# Patient Record
Sex: Male | Born: 1970
Health system: Southern US, Community
[De-identification: ages and names within clinical notes are randomized; demographics above are authoritative.]

## PROBLEM LIST (undated history)

## (undated) DIAGNOSIS — E785 Hyperlipidemia, unspecified: Secondary | ICD-10-CM

## (undated) DIAGNOSIS — I519 Heart disease, unspecified: Secondary | ICD-10-CM

## (undated) DIAGNOSIS — N200 Calculus of kidney: Secondary | ICD-10-CM

## (undated) DIAGNOSIS — E119 Type 2 diabetes mellitus without complications: Secondary | ICD-10-CM

## (undated) DIAGNOSIS — K760 Fatty (change of) liver, not elsewhere classified: Secondary | ICD-10-CM

## (undated) DIAGNOSIS — Z789 Other specified health status: Secondary | ICD-10-CM

## (undated) DIAGNOSIS — G4730 Sleep apnea, unspecified: Secondary | ICD-10-CM

## (undated) HISTORY — DX: Other specified health status: Z78.9

## (undated) HISTORY — PX: UMBILICAL HERNIA REPAIR: SHX196

## (undated) HISTORY — DX: Heart disease, unspecified: I51.9

## (undated) HISTORY — DX: Hyperlipidemia, unspecified: E78.5

## (undated) HISTORY — PX: COLONOSCOPY: SHX174

## (undated) HISTORY — DX: Calculus of kidney: N20.0

## (undated) HISTORY — DX: Type 2 diabetes mellitus without complications: E11.9

## (undated) HISTORY — DX: Fatty (change of) liver, not elsewhere classified: K76.0

## (undated) HISTORY — PX: KNEE ARTHROSCOPY: SUR90

---

## 2000-06-19 ENCOUNTER — Emergency Department (HOSPITAL_COMMUNITY): Admission: EM | Admit: 2000-06-19 | Discharge: 2000-06-19 | Payer: Self-pay | Admitting: Emergency Medicine

## 2000-06-19 ENCOUNTER — Encounter: Payer: Self-pay | Admitting: Emergency Medicine

## 2013-05-24 ENCOUNTER — Emergency Department (HOSPITAL_COMMUNITY)

## 2013-05-24 ENCOUNTER — Emergency Department (HOSPITAL_COMMUNITY)
Admission: EM | Admit: 2013-05-24 | Discharge: 2013-05-24 | Disposition: A | Discharge status: Home / Self Care | Attending: Emergency Medicine | Admitting: Emergency Medicine

## 2013-05-24 ENCOUNTER — Encounter (HOSPITAL_COMMUNITY): Payer: Self-pay | Admitting: Emergency Medicine

## 2013-05-24 DIAGNOSIS — N23 Unspecified renal colic: Secondary | ICD-10-CM

## 2013-05-24 DIAGNOSIS — R109 Unspecified abdominal pain: Secondary | ICD-10-CM | POA: Insufficient documentation

## 2013-05-24 DIAGNOSIS — R112 Nausea with vomiting, unspecified: Secondary | ICD-10-CM | POA: Insufficient documentation

## 2013-05-24 DIAGNOSIS — E119 Type 2 diabetes mellitus without complications: Secondary | ICD-10-CM

## 2013-05-24 HISTORY — DX: Sleep apnea, unspecified: G47.30

## 2013-05-24 LAB — BASIC METABOLIC PANEL
Calcium: 8.9 mg/dL (ref 8.4–10.5)
Creatinine, Ser: 1.05 mg/dL (ref 0.50–1.35)
GFR calc non Af Amer: 85 mL/min — ABNORMAL LOW (ref >90)
Glucose, Bld: 406 mg/dL — ABNORMAL HIGH (ref 70–99)
Potassium: 3.8 mEq/L (ref 3.5–5.1)
Sodium: 134 mEq/L — ABNORMAL LOW (ref 135–145)

## 2013-05-24 LAB — COMPREHENSIVE METABOLIC PANEL
ALT: 83 U/L — ABNORMAL HIGH (ref 0–53)
AST: 41 U/L — ABNORMAL HIGH (ref 0–37)
Albumin: 4.2 g/dL (ref 3.5–5.2)
Alkaline Phosphatase: 88 U/L (ref 39–117)
BUN: 19 mg/dL (ref 6–23)
CO2: 19 mEq/L (ref 19–32)
Calcium: 9.6 mg/dL (ref 8.4–10.5)
Chloride: 95 mEq/L — ABNORMAL LOW (ref 96–112)
Creatinine, Ser: 1.01 mg/dL (ref 0.50–1.35)
GFR calc Af Amer: >90 mL/min (ref >90)
GFR calc non Af Amer: 89 mL/min — ABNORMAL LOW (ref >90)
Glucose, Bld: 490 mg/dL — ABNORMAL HIGH (ref 70–99)
Potassium: 3.6 mEq/L (ref 3.5–5.1)
Sodium: 130 mEq/L — ABNORMAL LOW (ref 135–145)
Total Bilirubin: 0.7 mg/dL (ref 0.3–1.2)
Total Protein: 7 g/dL (ref 6.0–8.3)

## 2013-05-24 LAB — CBC WITH DIFFERENTIAL/PLATELET
Basophils Absolute: 0.1 10*3/uL (ref 0.0–0.1)
Basophils Relative: 1 % (ref 0–1)
Eosinophils Absolute: 0.3 10*3/uL (ref 0.0–0.7)
Eosinophils Relative: 2 % (ref 0–5)
HCT: 38.4 % — ABNORMAL LOW (ref 39.0–52.0)
Hemoglobin: 14.8 g/dL (ref 13.0–17.0)
Lymphocytes Relative: 13 % (ref 12–46)
Lymphs Abs: 1.9 10*3/uL (ref 0.7–4.0)
MCH: 31.7 pg (ref 26.0–34.0)
MCHC: 38.5 g/dL — ABNORMAL HIGH (ref 30.0–36.0)
MCV: 82.2 fL (ref 78.0–100.0)
Monocytes Absolute: 0.9 10*3/uL (ref 0.1–1.0)
Monocytes Relative: 6 % (ref 3–12)
Neutro Abs: 11.2 10*3/uL — ABNORMAL HIGH (ref 1.7–7.7)
Neutrophils Relative %: 78 % — ABNORMAL HIGH (ref 43–77)
Platelets: 173 10*3/uL (ref 150–400)
RBC: 4.67 MIL/uL (ref 4.22–5.81)
RDW: 12.3 % (ref 11.5–15.5)
WBC: 14.4 10*3/uL — ABNORMAL HIGH (ref 4.0–10.5)

## 2013-05-24 LAB — URINALYSIS, ROUTINE W REFLEX MICROSCOPIC
Bilirubin Urine: NEGATIVE
Glucose, UA: >1000 mg/dL — AB
Ketones, ur: 15 mg/dL — AB
Urobilinogen, UA: 0.2 mg/dL (ref 0.0–1.0)
pH: 5 (ref 5.0–8.0)

## 2013-05-24 LAB — BLOOD GAS, VENOUS
Acid-base deficit: 3.6 mmol/L — ABNORMAL HIGH (ref 0.0–2.0)
Bicarbonate: 22 mEq/L (ref 20.0–24.0)
O2 Saturation: 58.8 %
Patient temperature: 98.6
pO2, Ven: 32.7 mmHg (ref 30.0–45.0)

## 2013-05-24 LAB — GLUCOSE, CAPILLARY
Glucose-Capillary: 408 mg/dL — ABNORMAL HIGH (ref 70–99)
Glucose-Capillary: 335 mg/dL — ABNORMAL HIGH (ref 70–99)
Glucose-Capillary: 334 mg/dL — ABNORMAL HIGH (ref 70–99)
Glucose-Capillary: 310 mg/dL — ABNORMAL HIGH (ref 70–99)
Glucose-Capillary: 174 mg/dL — ABNORMAL HIGH (ref 70–99)

## 2013-05-24 LAB — URINE MICROSCOPIC-ADD ON

## 2013-05-24 LAB — LIPASE, BLOOD: Lipase: 23 U/L (ref 11–59)

## 2013-05-24 MED ORDER — DEXTROSE-NACL 5-0.45 % IV SOLN: INTRAVENOUS | Status: DC

## 2013-05-24 MED ORDER — IOHEXOL 300 MG/ML  SOLN
100.0000 mL | Freq: Once | INTRAMUSCULAR | Status: AC | PRN
  Administered 2013-05-24: 100 mL via INTRAVENOUS

## 2013-05-24 MED ORDER — ONDANSETRON HCL 4 MG/2ML IJ SOLN
4.0000 mg | Freq: Once | INTRAMUSCULAR | Status: AC
  Administered 2013-05-24: 4 mg via INTRAVENOUS
  Filled 2013-05-24: qty 2

## 2013-05-24 MED ORDER — METFORMIN HCL 500 MG PO TABS: 500.0000 mg | ORAL_TABLET | Freq: Two times a day (BID) | ORAL | Status: DC

## 2013-05-24 MED ORDER — OXYCODONE-ACETAMINOPHEN 5-325 MG PO TABS: 1.0000 | ORAL_TABLET | ORAL | Status: DC | PRN

## 2013-05-24 MED ORDER — HYDROMORPHONE HCL PF 1 MG/ML IJ SOLN
1.0000 mg | Freq: Once | INTRAMUSCULAR | Status: AC
  Administered 2013-05-24: 1 mg via INTRAVENOUS
  Filled 2013-05-24: qty 1

## 2013-05-24 MED ORDER — MORPHINE SULFATE 4 MG/ML IJ SOLN
4.0000 mg | Freq: Once | INTRAMUSCULAR | Status: AC
  Administered 2013-05-24: 4 mg via INTRAVENOUS
  Filled 2013-05-24: qty 1

## 2013-05-24 MED ORDER — SODIUM CHLORIDE 0.9 % IV SOLN
1000.0000 mL | INTRAVENOUS | Status: DC
  Administered 2013-05-24: 1000 mL via INTRAVENOUS

## 2013-05-24 MED ORDER — SODIUM CHLORIDE 0.9 % IV BOLUS (SEPSIS)
1000.0000 mL | Freq: Once | INTRAVENOUS | Status: AC
  Administered 2013-05-24: 1000 mL via INTRAVENOUS

## 2013-05-24 MED ORDER — IOHEXOL 300 MG/ML  SOLN
50.0000 mL | Freq: Once | INTRAMUSCULAR | Status: AC | PRN
  Administered 2013-05-24: 50 mL via ORAL

## 2013-05-24 MED ORDER — SODIUM CHLORIDE 0.9 % IV SOLN
INTRAVENOUS | Status: DC
  Administered 2013-05-24: 3.5 [IU]/h via INTRAVENOUS
  Filled 2013-05-24: qty 1

## 2013-05-24 NOTE — ED Notes (Signed)
Per EMS pt c/o of ab pain to lower quadrant that started 0100. Pt rates pain 10/10 Vomiting and diaphoretic on arrival with elevated BP 220/130. Pulses WDL. 4mg  zofran in route that has decreased vomiting and fentanyl.

## 2013-05-24 NOTE — ED Notes (Signed)
I-stat CG4 given to Dr Nanavati. 

## 2013-05-24 NOTE — ED Provider Notes (Signed)
Patient presents to the ER for evaluation of abdominal pain. Patient had severe pain upon arrival, improved with treatment. Ultimately, patient's pain found to be secondary to renal colic. Will require urology followup and analgesia.  Patient felt to be hyperglycemic today. No previous history of diabetes. This is new onset diabetes without DKA. Patient treated with insulin, IV fluids with improvement. We'll initiate metformin, but need to wait 48 hours after IV contrast. Patient to followup at [facility] for ongoing management of diabetes.  Gilda Crease, MD 05/24/13 1032

## 2013-05-24 NOTE — ED Provider Notes (Signed)
CSN: [id number]     Arrival date & time 05/24/13  0310 History   First MD Initiated Contact with Patient 05/24/13 [phone]     Chief Complaint  Patient presents with  . Abdominal Pain   (Consider location/radiation/quality/duration/timing/severity/associated sxs/prior Treatment) HPI Comments: 42 y/o male comes in with cc of abd pain. Pt started having diffuse abd pain around 1 am. It woke him up from sleep. The pain is dull, constant, 10/10 at one point, now down to 7/10. There is no hx of same pain before, no specific aggravating or relieving factors and + associated nausea, no emesis, no diarrhea. Pt has no hx of abd surgeries.   Patient is a 42 y.o. male presenting with abdominal pain. The history is provided by the patient.  Abdominal Pain Associated symptoms: nausea and vomiting   Associated symptoms: no chest pain, no chills, no cough, no dysuria, no fever and no shortness of breath     Past Medical History  Diagnosis Date  . Sleep apnea    Past Surgical History  Procedure Laterality Date  . Abdominal surgery    . Knee surgery     History reviewed. No pertinent family history. History  Substance Use Topics  . Smoking status: Never Smoker   . Smokeless tobacco: Not on file  . Alcohol Use: Yes    Review of Systems  Constitutional: Negative for fever, chills, activity change and appetite change.  Eyes: Negative for visual disturbance.  Respiratory: Negative for cough, chest tightness and shortness of breath.   Cardiovascular: Negative for chest pain.  Gastrointestinal: Positive for nausea, vomiting and abdominal pain. Negative for abdominal distention.  Genitourinary: Negative for dysuria, enuresis and difficulty urinating.  Musculoskeletal: Negative for arthralgias and neck pain.  Neurological: Negative for dizziness, light-headedness and headaches.  Psychiatric/Behavioral: Negative for confusion.    Allergies  Codeine  Home Medications   Current Outpatient Rx   Name  Route  Sig  Dispense  Refill  . pseudoephedrine (SUDAFED) 60 MG tablet   Oral   Take 60 mg by mouth every 6 (six) hours as needed for congestion (or sinus headache).          BP 154/87  Temp(Src) 98.1 F (36.7 C) (Oral)  Resp 17  SpO2 96% Physical Exam  Nursing note and vitals reviewed. Constitutional: He is oriented to person, place, and time. He appears well-developed.  HENT:  Head: Normocephalic and atraumatic.  Eyes: Conjunctivae and EOM are normal. Pupils are equal, round, and reactive to light.  Neck: Normal range of motion. Neck supple.  Cardiovascular: Normal rate and regular rhythm.   Pulmonary/Chest: Effort normal and breath sounds normal.  Abdominal: Soft. Bowel sounds are normal. He exhibits no distension. There is tenderness. There is no rebound and no guarding.  Diffuse tenderness, mostly upper quadrants.  Neurological: He is alert and oriented to person, place, and time.  Skin: Skin is warm.    ED Course  Procedures (including critical care time) Labs Review Labs Reviewed  COMPREHENSIVE METABOLIC PANEL - Abnormal; Notable for the following:    Sodium 130 (*)    Chloride 95 (*)    Glucose, Bld 490 (*)    AST 41 (*)    ALT 83 (*)    GFR calc non Af Amer 89 (*)    All other components within normal limits  CBC WITH DIFFERENTIAL - Abnormal; Notable for the following:    WBC 14.4 (*)    HCT 38.4 (*)    MCHC  38.5 (*)    Neutrophils Relative % 78 (*)    Neutro Abs 11.2 (*)    All other components within normal limits  URINALYSIS, ROUTINE W REFLEX MICROSCOPIC - Abnormal; Notable for the following:    Specific Gravity, Urine 1.039 (*)    Glucose, UA >1000 (*)    Hgb urine dipstick MODERATE (*)    Ketones, ur 15 (*)    All other components within normal limits  BLOOD GAS, VENOUS - Abnormal; Notable for the following:    pH, Ven 7.323 (*)    pCO2, Ven 43.6 (*)    Acid-base deficit 3.6 (*)    All other components within normal limits  GLUCOSE,  CAPILLARY - Abnormal; Notable for the following:    Glucose-Capillary 420 (*)    All other components within normal limits  BASIC METABOLIC PANEL - Abnormal; Notable for the following:    Sodium 134 (*)    Glucose, Bld 406 (*)    GFR calc non Af Amer 85 (*)    All other components within normal limits  GLUCOSE, CAPILLARY - Abnormal; Notable for the following:    Glucose-Capillary 408 (*)    All other components within normal limits  GLUCOSE, CAPILLARY - Abnormal; Notable for the following:    Glucose-Capillary 335 (*)    All other components within normal limits  CG4 I-STAT (LACTIC ACID) - Abnormal; Notable for the following:    Lactic Acid, Venous 2.87 (*)    All other components within normal limits  LIPASE, BLOOD  TROPONIN I  URINE MICROSCOPIC-ADD ON  BASIC METABOLIC PANEL  BASIC METABOLIC PANEL  BASIC METABOLIC PANEL  BASIC METABOLIC PANEL  BASIC METABOLIC PANEL  BASIC METABOLIC PANEL  BASIC METABOLIC PANEL  BASIC METABOLIC PANEL   Imaging Review US Abdomen Complete  05/24/2013   CLINICAL DATA:  Abdominal pain.  EXAM: ULTRASOUND ABDOMEN COMPLETE  COMPARISON:  None.  FINDINGS: Gallbladder  No gallstones or wall thickening visualized. No sonographic Murphy sign noted.  Common bile duct  Diameter: 3.3 mm, normal.  Liver  Diffusely increased hepatic echotexture consistent with fatty infiltration.  IVC  No abnormality visualized.  Pancreas  Visualized portion unremarkable.  Spleen  Spleen is enlarged, measuring 10.7 x 13.8 x 4.7 cm, representing a volume of 360 mm. No focal lesions.  Right Kidney  Length: 12.2 Cm. Echogenicity within normal limits. No mass or hydronephrosis visualized.  Left Kidney  Length: 12.8 cm. Mild prominence of the renal pelvis is likely normal variation. Trace amount of perinephric fluid is visualized.  Abdominal aorta  No aneurysm visualized.  Images obtained of the bladder demonstrate no wall thickening or filling defect. Color flow images demonstrate a right  ureteral jet of the left ureteral jet is not demonstrated. This is of nonspecific significance.  IMPRESSION: Diffuse fatty infiltration of the liver. Gallbladder is normal. Spleen is enlarged. Mild prominence of left renal pelvis, likely within normal variation.   Electronically Signed   By: Burman Nieves M.D.   On: 05/24/2013 05:54    EKG Interpretation   None       MDM  No diagnosis found.  Pt comes in with cc of abd pain.  Pt is noted to have elevated blood glucose, with mild anion gap - 16. Likely new onset diabetes for the patient.  He also has diffuse abd pain, US abdomen shows splenomegaly, with WBC showing left sided shift. CT ordered, and pending.  Infection vs. Hematologic process.  Will admit. CT is pending,  so will sign out to Dr. Blinda Leatherwood.  No antibiotics given. Pt started on DKA protocol for blood glucose optimization.   Derwood Kaplan, MD 05/24/13 [phone]

## 2013-06-04 ENCOUNTER — Ambulatory Visit: Attending: Internal Medicine | Admitting: Internal Medicine

## 2013-06-04 VITALS — BP 147/88 | HR 64 | Temp 98.6°F | Ht 71.0 in | Wt 236.8 lb

## 2013-06-04 DIAGNOSIS — Z23 Encounter for immunization: Secondary | ICD-10-CM

## 2013-06-04 DIAGNOSIS — E119 Type 2 diabetes mellitus without complications: Secondary | ICD-10-CM | POA: Insufficient documentation

## 2013-06-04 DIAGNOSIS — Z7689 Persons encountering health services in other specified circumstances: Secondary | ICD-10-CM

## 2013-06-04 DIAGNOSIS — Z7189 Other specified counseling: Secondary | ICD-10-CM

## 2013-06-04 LAB — CBC WITH DIFFERENTIAL/PLATELET
Basophils Relative: 2 % — ABNORMAL HIGH (ref 0–1)
Eosinophils Absolute: 0.3 10*3/uL (ref 0.0–0.7)
Eosinophils Relative: 4 % (ref 0–5)
Lymphs Abs: 2.6 10*3/uL (ref 0.7–4.0)
MCH: 29.9 pg (ref 26.0–34.0)
MCHC: 35.8 g/dL (ref 30.0–36.0)
MCV: 83.5 fL (ref 78.0–100.0)
Monocytes Absolute: 0.6 10*3/uL (ref 0.1–1.0)
Neutrophils Relative %: 47 % (ref 43–77)
Platelets: 307 10*3/uL (ref 150–400)
RBC: 4.98 MIL/uL (ref 4.22–5.81)

## 2013-06-04 LAB — POCT URINALYSIS DIPSTICK
Bilirubin, UA: NEGATIVE
Blood, UA: NEGATIVE
Glucose, UA: NEGATIVE
Nitrite, UA: NEGATIVE
Spec Grav, UA: 1.01
pH, UA: 5

## 2013-06-04 LAB — COMPLETE METABOLIC PANEL WITH GFR
ALT: 79 U/L — ABNORMAL HIGH (ref 0–53)
Albumin: 4.5 g/dL (ref 3.5–5.2)
Alkaline Phosphatase: 61 U/L (ref 39–117)
BUN: 12 mg/dL (ref 6–23)
CO2: 27 mEq/L (ref 19–32)
Creat: 0.96 mg/dL (ref 0.50–1.35)
GFR, Est African American: >89 mL/min
GFR, Est Non African American: >89 mL/min
Glucose, Bld: 66 mg/dL — ABNORMAL LOW (ref 70–99)
Sodium: 140 mEq/L (ref 135–145)
Total Bilirubin: 0.8 mg/dL (ref 0.3–1.2)

## 2013-06-04 LAB — LIPID PANEL
Cholesterol: 163 mg/dL (ref 0–200)
HDL: 34 mg/dL — ABNORMAL LOW (ref >39)
Triglycerides: 134 mg/dL (ref <150)

## 2013-06-04 LAB — POCT GLYCOSYLATED HEMOGLOBIN (HGB A1C): Hemoglobin A1C: 7

## 2013-06-04 MED ORDER — TRAMADOL HCL 50 MG PO TABS: 50.0000 mg | ORAL_TABLET | Freq: Three times a day (TID) | ORAL | Status: DC | PRN

## 2013-06-04 NOTE — Progress Notes (Signed)
[name], male   DOB: Sep 08, 1970, 42 y.o.   MRN: [id number]   CC:  HPI: 42 year old male evaluated in the ED on 11/25 for abdominal pain. The patient had a CT scan that showed 0.3 cm stone at the left UVJ. The patient out of 10 on 10 pain. He also had an ultrasound of his abdomen that showed fatty liver infiltration. The patient had a CBG of 490 and a white count of 14.4. The patient had a urinalysis that was negative except for greater than thousand milligrams per deciliter of glucose.  Today the patient denies any dysuria hematuria flank pain, fever, chills at home. He also has a urology appointment on Monday.  The patient was prescribed metformin 500 mg by mouth twice a day for his diabetes. Since then he started, to have his fasting CBG has ranged from 85-120.    Past medical history Right knee meniscal tear Right shoulder rotator cuff tear Status post colonoscopy in 2013 found to have 4-5 polyps, needs a repeat colonoscopy in 2 years Loss of hearing because he was in the [employer]  Social history nonsmoker nonalcoholic    Allergies  Allergen Reactions  . Codeine Nausea And Vomiting and Other (See Comments)    headache   Past Medical History  Diagnosis Date  . Sleep apnea    Current Outpatient Prescriptions on File Prior to Visit  Medication Sig Dispense Refill  . metFORMIN (GLUCOPHAGE) 500 MG tablet Take 1 tablet (500 mg total) by mouth 2 (two) times daily with a meal. Start on 05/26/2013  60 tablet  3  . oxyCODONE-acetaminophen (PERCOCET) 5-325 MG per tablet Take 1-2 tablets by mouth every 4 (four) hours as needed.  20 tablet  0   No current facility-administered medications on file prior to visit.   No family history on file. History   Social History  . Marital Status: Married    Spouse Name: N/A    Number of Children: N/A  . Years of Education: N/A   Occupational History  . Not on file.   Social History Main Topics  . Smoking status:  Never Smoker   . Smokeless tobacco: Not on file  . Alcohol Use: Yes  . Drug Use: No  . Sexual Activity: Not on file   Other Topics Concern  . Not on file   Social History Narrative  . No narrative on file    Review of Systems  Constitutional: Negative for fever, chills, diaphoresis, activity change, appetite change and fatigue.  HENT: Negative for ear pain, nosebleeds, congestion, facial swelling, rhinorrhea, neck pain, neck stiffness and ear discharge.   Eyes: Negative for pain, discharge, redness, itching and visual disturbance.  Respiratory: Negative for cough, choking, chest tightness, shortness of breath, wheezing and stridor.   Cardiovascular: Negative for chest pain, palpitations and leg swelling.  Gastrointestinal: Negative for abdominal distention.  Genitourinary: Negative for dysuria, urgency, frequency, hematuria, flank pain, decreased urine volume, difficulty urinating and dyspareunia.  Musculoskeletal: Negative for back pain, joint swelling, arthralgias and gait problem.  Neurological: Negative for dizziness, tremors, seizures, syncope, facial asymmetry, speech difficulty, weakness, light-headedness, numbness and headaches.  Hematological: Negative for adenopathy. Does not bruise/bleed easily.  Psychiatric/Behavioral: Negative for hallucinations, behavioral problems, confusion, dysphoric mood, decreased concentration and agitation.    Objective:   Filed Vitals:   06/04/13 1045  BP: 147/88  Pulse: 64  Temp: 98.6 F (37 C)    Physical Exam  Constitutional: Appears well-developed and well-nourished. No distress.  HENT: Normocephalic. External right and left ear normal. Oropharynx is clear and moist.  Eyes: Conjunctivae and EOM are normal. PERRLA, no scleral icterus.  Neck: Normal ROM. Neck supple. No JVD. No tracheal deviation. No thyromegaly.  CVS: RRR, S1/S2 +, no murmurs, no gallops, no carotid bruit.  Pulmonary: Effort and breath sounds normal, no stridor,  rhonchi, wheezes, rales.  Abdominal: Soft. BS +,  no distension, tenderness, rebound or guarding.  Musculoskeletal: Normal range of motion. No edema and no tenderness.  Lymphadenopathy: No lymphadenopathy noted, cervical, inguinal. Neuro: Alert. Normal reflexes, muscle tone coordination. No cranial nerve deficit. Skin: Skin is warm and dry. No rash noted. Not diaphoretic. No erythema. No pallor.  Psychiatric: Normal mood and affect. Behavior, judgment, thought content normal.   Lab Results  Component Value Date   WBC 14.4* 05/24/2013   HGB 14.8 05/24/2013   HCT 38.4* 05/24/2013   MCV 82.2 05/24/2013   PLT 173 05/24/2013   Lab Results  Component Value Date   CREATININE 1.05 05/24/2013   BUN 18 05/24/2013   NA 134* 05/24/2013   K 3.8 05/24/2013   CL 100 05/24/2013   CO2 21 05/24/2013    Lab Results  Component Value Date   HGBA1C 7.0 06/04/2013   Lipid Panel  No results found for this basename: chol, trig, hdl, cholhdl, vldl, ldlcalc       Assessment and plan:   There are no active problems to display for this patient.   Newly diagnosed type 2 diabetes Ophthalmology referral Continue metformin at the current dose Will check lipid panel, A1c is 7.0 Dietitian referral   Establish care Patient will need a repeat colonoscopy in 2 years because of history of 4-5 polyps Flu Vaccination will be given He states that he's up-to-date with his hepatitis B and tetanus immunization   Nephrolithiasis Encouraged hydration orally Urology appointment next week   Follow up in 2 months The patient was given clear instructions to go to ER or return to medical center if symptoms don't improve, worsen or new problems develop. The patient verbalized understanding. The patient was told to call to get any lab results if not heard anything in the next week.

## 2013-06-07 ENCOUNTER — Telehealth: Payer: Self-pay | Admitting: Emergency Medicine

## 2013-06-07 NOTE — Telephone Encounter (Signed)
Pt wife given results.  

## 2013-06-07 NOTE — Telephone Encounter (Signed)
Message copied by Darlis Loan on Tue Jun 07, 2013  2:26 PM ------      Message from: Susie Cassette MD, [facility]      Created: Tue Jun 07, 2013  2:15 PM       Please notify patient that all labs were normal including triglycerides. Hemoglobin A1c was 7.0. Liver function, AST, ALT mildly abnormal because of fatty liver, ------

## 2013-08-05 ENCOUNTER — Ambulatory Visit: Attending: Internal Medicine | Admitting: Internal Medicine

## 2013-08-05 ENCOUNTER — Encounter: Payer: Self-pay | Admitting: Internal Medicine

## 2013-08-05 VITALS — BP 131/88 | HR 67 | Temp 99.1°F | Resp 16 | Ht 70.5 in | Wt 230.0 lb

## 2013-08-05 DIAGNOSIS — G4730 Sleep apnea, unspecified: Secondary | ICD-10-CM | POA: Insufficient documentation

## 2013-08-05 DIAGNOSIS — K7689 Other specified diseases of liver: Secondary | ICD-10-CM | POA: Insufficient documentation

## 2013-08-05 DIAGNOSIS — E119 Type 2 diabetes mellitus without complications: Secondary | ICD-10-CM | POA: Insufficient documentation

## 2013-08-05 DIAGNOSIS — Z79899 Other long term (current) drug therapy: Secondary | ICD-10-CM | POA: Insufficient documentation

## 2013-08-05 DIAGNOSIS — N201 Calculus of ureter: Secondary | ICD-10-CM | POA: Insufficient documentation

## 2013-08-05 NOTE — Progress Notes (Signed)
[name], male   DOB: 10-23-1970, 43 y.o.   MRN: [id number]   CC:  HPI:  43 year old male evaluated in the ED on 11/25 for abdominal pain. The patient had a CT scan that showed 0.3 cm stone at the left UVJ. The patient out of 10 on 10 pain. He also had an ultrasound of his abdomen that showed fatty liver infiltration. The patient had a CBG of 120 this morning. The patient has been compliant with metformin. Currently he is taking low fat high protein no sugar diet. He eats on frequent intervals every 3 hours. He does not however exercise he has lost about 6 pounds since his last visit  Past medical history  Right knee meniscal tear  Right shoulder rotator cuff tear  Status post colonoscopy in 2013 found to have 4-5 polyps, needs a repeat colonoscopy in 2 years  Loss of hearing because he was in the [employer] history nonsmoker nonalcoholic       Allergies  Allergen Reactions  . Codeine Nausea And Vomiting and Other (See Comments)    headache   Past Medical History  Diagnosis Date  . Sleep apnea    Current Outpatient Prescriptions on File Prior to Visit  Medication Sig Dispense Refill  . metFORMIN (GLUCOPHAGE) 500 MG tablet Take 1 tablet (500 mg total) by mouth 2 (two) times daily with a meal. Start on 05/26/2013  60 tablet  3  . oxyCODONE-acetaminophen (PERCOCET) 5-325 MG per tablet Take 1-2 tablets by mouth every 4 (four) hours as needed.  20 tablet  0  . traMADol (ULTRAM) 50 MG tablet Take 1 tablet (50 mg total) by mouth every 8 (eight) hours as needed.  30 tablet  0   No current facility-administered medications on file prior to visit.   No family history on file. History   Social History  . Marital Status: Married    Spouse Name: N/A    Number of Children: N/A  . Years of Education: N/A   Occupational History  . Not on file.   Social History Main Topics  . Smoking status: Never Smoker   . Smokeless tobacco: Not on file  . Alcohol Use:  Yes  . Drug Use: No  . Sexual Activity: Not on file   Other Topics Concern  . Not on file   Social History Narrative  . No narrative on file    Review of Systems  Constitutional: Negative for fever, chills, diaphoresis, activity change, appetite change and fatigue.  HENT: Negative for ear pain, nosebleeds, congestion, facial swelling, rhinorrhea, neck pain, neck stiffness and ear discharge.   Eyes: Negative for pain, discharge, redness, itching and visual disturbance.  Respiratory: Negative for cough, choking, chest tightness, shortness of breath, wheezing and stridor.   Cardiovascular: Negative for chest pain, palpitations and leg swelling.  Gastrointestinal: Negative for abdominal distention.  Genitourinary: Negative for dysuria, urgency, frequency, hematuria, flank pain, decreased urine volume, difficulty urinating and dyspareunia.  Musculoskeletal: Negative for back pain, joint swelling, arthralgias and gait problem.  Neurological: Negative for dizziness, tremors, seizures, syncope, facial asymmetry, speech difficulty, weakness, light-headedness, numbness and headaches.  Hematological: Negative for adenopathy. Does not bruise/bleed easily.  Psychiatric/Behavioral: Negative for hallucinations, behavioral problems, confusion, dysphoric mood, decreased concentration and agitation.    Objective:   Filed Vitals:   08/05/13 1639  BP: 131/88  Pulse: 67  Temp: 99.1 F (37.3 C)  Resp: 16    Physical Exam  Constitutional: Appears well-developed and  well-nourished. No distress.  HENT: Normocephalic. External right and left ear normal. Oropharynx is clear and moist.  Eyes: Conjunctivae and EOM are normal. PERRLA, no scleral icterus.  Neck: Normal ROM. Neck supple. No JVD. No tracheal deviation. No thyromegaly.  CVS: RRR, S1/S2 +, no murmurs, no gallops, no carotid bruit.  Pulmonary: Effort and breath sounds normal, no stridor, rhonchi, wheezes, rales.  Abdominal: Soft. BS +,  no  distension, tenderness, rebound or guarding.  Musculoskeletal: Normal range of motion. No edema and no tenderness.  Lymphadenopathy: No lymphadenopathy noted, cervical, inguinal. Neuro: Alert. Normal reflexes, muscle tone coordination. No cranial nerve deficit. Skin: Skin is warm and dry. No rash noted. Not diaphoretic. No erythema. No pallor.  Psychiatric: Normal mood and affect. Behavior, judgment, thought content normal.   Lab Results  Component Value Date   WBC 6.8 06/04/2013   HGB 14.9 06/04/2013   HCT 41.6 06/04/2013   MCV 83.5 06/04/2013   PLT 307 06/04/2013   Lab Results  Component Value Date   CREATININE 0.96 06/04/2013   BUN 12 06/04/2013   NA 140 06/04/2013   K 4.6 06/04/2013   CL 103 06/04/2013   CO2 27 06/04/2013    Lab Results  Component Value Date   HGBA1C 7.0 06/04/2013   Lipid Panel     Component Value Date/Time   CHOL 163 06/04/2013 1112   TRIG 134 06/04/2013 1112   HDL 34* 06/04/2013 1112   CHOLHDL 4.8 06/04/2013 1112   VLDL 27 06/04/2013 1112   LDLCALC 102* 06/04/2013 1112       Assessment and plan:   There are no active problems to display for this patient.  Diabetes mellitus Appears to be doing well, Hemoglobin A1c in one month Ophthalmology referral for a retinal scan Nutritionist referral Encouraged to exercise and lose weight     Followup in 2 months  The patient was given clear instructions to go to ER or return to medical center if symptoms don't improve, worsen or new problems develop. The patient verbalized understanding. The patient was told to call to get any lab results if not heard anything in the next week.

## 2013-08-05 NOTE — Progress Notes (Signed)
Pt is here following up on his diabetes. 

## 2013-09-02 ENCOUNTER — Ambulatory Visit: Attending: Internal Medicine

## 2013-09-02 DIAGNOSIS — E119 Type 2 diabetes mellitus without complications: Secondary | ICD-10-CM

## 2013-09-02 DIAGNOSIS — E559 Vitamin D deficiency, unspecified: Secondary | ICD-10-CM

## 2013-09-02 LAB — CBC WITH DIFFERENTIAL/PLATELET
BASOS PCT: 2 % — AB (ref 0–1)
Basophils Absolute: 0.1 10*3/uL (ref 0.0–0.1)
EOS PCT: 11 % — AB (ref 0–5)
Eosinophils Absolute: 0.8 10*3/uL — ABNORMAL HIGH (ref 0.0–0.7)
HCT: 43.6 % (ref 39.0–52.0)
Hemoglobin: 15.7 g/dL (ref 13.0–17.0)
LYMPHS ABS: 1.9 10*3/uL (ref 0.7–4.0)
Lymphocytes Relative: 25 % (ref 12–46)
MCH: 30.3 pg (ref 26.0–34.0)
MCHC: 36 g/dL (ref 30.0–36.0)
MCV: 84 fL (ref 78.0–100.0)
MONOS PCT: 6 % (ref 3–12)
Monocytes Absolute: 0.4 10*3/uL (ref 0.1–1.0)
NEUTROS PCT: 56 % (ref 43–77)
Neutro Abs: 4.1 10*3/uL (ref 1.7–7.7)
PLATELETS: 200 10*3/uL (ref 150–400)
RBC: 5.19 MIL/uL (ref 4.22–5.81)
RDW: 13 % (ref 11.5–15.5)
WBC: 7.4 10*3/uL (ref 4.0–10.5)

## 2013-09-02 LAB — COMPLETE METABOLIC PANEL WITH GFR
ALK PHOS: 56 U/L (ref 39–117)
ALT: 22 U/L (ref 0–53)
AST: 15 U/L (ref 0–37)
Albumin: 4.7 g/dL (ref 3.5–5.2)
BUN: 15 mg/dL (ref 6–23)
CO2: 29 mEq/L (ref 19–32)
CREATININE: 0.96 mg/dL (ref 0.50–1.35)
Calcium: 9.3 mg/dL (ref 8.4–10.5)
Chloride: 101 mEq/L (ref 96–112)
GFR, Est African American: >89 mL/min
GLUCOSE: 113 mg/dL — AB (ref 70–99)
Potassium: 4.4 mEq/L (ref 3.5–5.3)
Sodium: 140 mEq/L (ref 135–145)
Total Bilirubin: 0.9 mg/dL (ref 0.2–1.2)
Total Protein: 6.7 g/dL (ref 6.0–8.3)

## 2013-09-02 LAB — HEMOGLOBIN A1C
Hgb A1c MFr Bld: 5.5 % (ref <5.7)
Mean Plasma Glucose: 111 mg/dL (ref <117)

## 2013-09-02 LAB — LIPID PANEL
CHOL/HDL RATIO: 5.9 ratio
CHOLESTEROL: 202 mg/dL — AB (ref 0–200)
HDL: 34 mg/dL — ABNORMAL LOW (ref >39)
LDL Cholesterol: 118 mg/dL — ABNORMAL HIGH (ref 0–99)
Triglycerides: 249 mg/dL — ABNORMAL HIGH (ref <150)
VLDL: 50 mg/dL — ABNORMAL HIGH (ref 0–40)

## 2013-09-03 LAB — VITAMIN D 25 HYDROXY (VIT D DEFICIENCY, FRACTURES): VIT D 25 HYDROXY: 35 ng/mL (ref 30–89)

## 2013-09-06 ENCOUNTER — Telehealth: Payer: Self-pay | Admitting: Emergency Medicine

## 2013-09-06 MED ORDER — GEMFIBROZIL 600 MG PO TABS: 600.0000 mg | ORAL_TABLET | Freq: Two times a day (BID) | ORAL | Status: DC

## 2013-09-06 NOTE — Telephone Encounter (Signed)
Left vm for pt to call when received message regarding new script Lopid 600 mg tab

## 2013-09-06 NOTE — Telephone Encounter (Signed)
Message copied by Ricci Barker on Tue Sep 06, 2013  9:53 AM ------      Message from: Allyson Sabal MD, [facility]      Created: Tue Sep 06, 2013  9:46 AM       Please notify patient of the triglycerides are elevated at 249. Please call in a prescription for Lopid 600 mg once a day, 30 tablets, 2 refills ------

## 2013-09-07 ENCOUNTER — Telehealth: Payer: Self-pay | Admitting: Internal Medicine

## 2013-09-07 MED ORDER — METFORMIN HCL 500 MG PO TABS: 500.0000 mg | ORAL_TABLET | Freq: Two times a day (BID) | ORAL | Status: DC

## 2013-09-07 NOTE — Telephone Encounter (Signed)
Pt. Called in regards to results from blood work and would like a refill for Metformin. Please call patient [phone]

## 2013-09-07 NOTE — Telephone Encounter (Signed)
Left message for pt to pick medication Metformin at [place]

## 2013-10-03 ENCOUNTER — Ambulatory Visit: Admitting: Internal Medicine

## 2013-10-19 ENCOUNTER — Ambulatory Visit

## 2013-10-26 ENCOUNTER — Ambulatory Visit

## 2013-11-01 ENCOUNTER — Encounter: Attending: Internal Medicine

## 2013-11-01 VITALS — Ht 70.0 in | Wt 233.0 lb

## 2013-11-01 DIAGNOSIS — E119 Type 2 diabetes mellitus without complications: Secondary | ICD-10-CM | POA: Insufficient documentation

## 2013-11-01 DIAGNOSIS — Z713 Dietary counseling and surveillance: Secondary | ICD-10-CM | POA: Insufficient documentation

## 2013-11-02 ENCOUNTER — Ambulatory Visit

## 2013-11-02 NOTE — Progress Notes (Signed)
Patient was seen on 11/01/13 for the first of a series of three diabetes self-management courses at the [facility].  Current HbA1c: 7.0%  The following learning objectives were met by the patient during this class:  Describe diabetes  State some common risk factors for diabetes  Defines the role of glucose and insulin  Identifies type of diabetes and pathophysiology  Describe the relationship between diabetes and cardiovascular risk  State the members of the Healthcare Team  States the rationale for glucose monitoring  State when to test glucose  State their individual Target Range  State the importance of logging glucose readings  Describe how to interpret glucose readings  Identifies A1C target  Explain the correlation between A1c and eAG values  State symptoms and treatment of high blood glucose  State symptoms and treatment of low blood glucose  Explain proper technique for glucose testing  Identifies proper sharps disposal  Handouts given during class include:  Living Well with Diabetes book  Carb Counting and Meal Planning book  Meal Plan Card  Carbohydrate guide  Meal planning worksheet  Low Sodium Flavoring Tips  The diabetes portion plate  P3I to eAG Conversion Chart  Diabetes Medications  Diabetes Recommended Care Schedule  Support Group  Diabetes Success Plan  Core Class Satisfaction Survey  Follow-Up Plan:  Attend core 2

## 2013-11-08 ENCOUNTER — Encounter

## 2013-11-08 DIAGNOSIS — E119 Type 2 diabetes mellitus without complications: Secondary | ICD-10-CM

## 2013-11-09 NOTE — Progress Notes (Signed)

## 2013-11-15 ENCOUNTER — Encounter

## 2013-11-15 DIAGNOSIS — E119 Type 2 diabetes mellitus without complications: Secondary | ICD-10-CM

## 2013-11-15 NOTE — Progress Notes (Signed)
Patient was seen on 11/15/13 for the third of a series of three diabetes self-management courses at the [facility]. The following learning objectives were met by the patient during this class:    State the amount of activity recommended for healthy living   Describe activities suitable for individual needs   Identify ways to regularly incorporate activity into daily life   Identify barriers to activity and ways to over come these barriers  Identify diabetes medications being personally used and their primary action for lowering glucose and possible side effects   Describe role of stress on blood glucose and develop strategies to address psychosocial issues   Identify diabetes complications and ways to prevent them  Explain how to manage diabetes during illness   Evaluate success in meeting personal goal   Establish 2-3 goals that they will plan to diligently work on until they return for the  80-monthfollow-up visit  Goals:  Follow Diabetes Meal Plan as instructed  Aim for 15-30 mins of physical activity daily as tolerated  Bring food record and glucose log to your follow up visit  Your patient has established the following 4 month goals in their individualized success plan: I will increase my activity level at least 3 days a week  I will reduce fat in my diet by eating smaller portion sizes  Your patient has identified these potential barriers to change:  Motivation Your patient has identified their diabetes self-care support plan as  Family Support  Plan:  Attend Core 4 in 4 months

## 2014-01-04 ENCOUNTER — Ambulatory Visit: Attending: Internal Medicine | Admitting: *Deleted

## 2014-01-04 VITALS — BP 174/100 | HR 79 | Resp 16

## 2014-01-04 DIAGNOSIS — E119 Type 2 diabetes mellitus without complications: Secondary | ICD-10-CM

## 2014-01-04 DIAGNOSIS — E139 Other specified diabetes mellitus without complications: Secondary | ICD-10-CM

## 2014-01-04 LAB — GLUCOSE, POCT (MANUAL RESULT ENTRY): POC Glucose: 159 mg/dl — AB (ref 70–99)

## 2014-01-04 LAB — POCT GLYCOSYLATED HEMOGLOBIN (HGB A1C): Hemoglobin A1C: 5.4

## 2014-01-04 NOTE — Progress Notes (Signed)
Patient here for CBG and Hgb A1C. CBg was 159. Patient states he had ate lunch at about 11:30. BP is elevated but never been diagnosed with HTN. Patient sat out in the lobby for 5-10 minutes BP recheck 156/93. Informed patient to keep a check on his BP at home and keep a log. Patient advised to bring log to next visit. Vivia Birmingham, RN

## 2014-01-25 ENCOUNTER — Ambulatory Visit: Attending: Internal Medicine | Admitting: Internal Medicine

## 2014-01-25 ENCOUNTER — Encounter: Payer: Self-pay | Admitting: Internal Medicine

## 2014-01-25 VITALS — BP 120/79 | HR 64 | Temp 98.0°F | Resp 16 | Wt 234.0 lb

## 2014-01-25 DIAGNOSIS — IMO0002 Reserved for concepts with insufficient information to code with codable children: Secondary | ICD-10-CM | POA: Insufficient documentation

## 2014-01-25 DIAGNOSIS — E1165 Type 2 diabetes mellitus with hyperglycemia: Secondary | ICD-10-CM | POA: Insufficient documentation

## 2014-01-25 DIAGNOSIS — L909 Atrophic disorder of skin, unspecified: Secondary | ICD-10-CM | POA: Insufficient documentation

## 2014-01-25 DIAGNOSIS — E785 Hyperlipidemia, unspecified: Secondary | ICD-10-CM | POA: Insufficient documentation

## 2014-01-25 DIAGNOSIS — L919 Hypertrophic disorder of the skin, unspecified: Secondary | ICD-10-CM

## 2014-01-25 DIAGNOSIS — Z139 Encounter for screening, unspecified: Secondary | ICD-10-CM | POA: Insufficient documentation

## 2014-01-25 DIAGNOSIS — E089 Diabetes mellitus due to underlying condition without complications: Secondary | ICD-10-CM

## 2014-01-25 DIAGNOSIS — L918 Other hypertrophic disorders of the skin: Secondary | ICD-10-CM

## 2014-01-25 DIAGNOSIS — E139 Other specified diabetes mellitus without complications: Secondary | ICD-10-CM | POA: Insufficient documentation

## 2014-01-25 DIAGNOSIS — E1169 Type 2 diabetes mellitus with other specified complication: Secondary | ICD-10-CM | POA: Insufficient documentation

## 2014-01-25 LAB — CBC WITH DIFFERENTIAL/PLATELET
Basophils Absolute: 0.1 10*3/uL (ref 0.0–0.1)
Basophils Relative: 1 % (ref 0–1)
EOS PCT: 6 % — AB (ref 0–5)
Eosinophils Absolute: 0.5 10*3/uL (ref 0.0–0.7)
HEMATOCRIT: 42.1 % (ref 39.0–52.0)
Hemoglobin: 14.9 g/dL (ref 13.0–17.0)
LYMPHS ABS: 2.3 10*3/uL (ref 0.7–4.0)
Lymphocytes Relative: 26 % (ref 12–46)
MCH: 30.4 pg (ref 26.0–34.0)
MCHC: 35.4 g/dL (ref 30.0–36.0)
MCV: 85.9 fL (ref 78.0–100.0)
MONO ABS: 0.6 10*3/uL (ref 0.1–1.0)
Monocytes Relative: 7 % (ref 3–12)
NEUTROS ABS: 5.3 10*3/uL (ref 1.7–7.7)
Neutrophils Relative %: 60 % (ref 43–77)
Platelets: 208 10*3/uL (ref 150–400)
RBC: 4.9 MIL/uL (ref 4.22–5.81)
RDW: 13.5 % (ref 11.5–15.5)
WBC: 8.9 10*3/uL (ref 4.0–10.5)

## 2014-01-25 LAB — COMPLETE METABOLIC PANEL WITH GFR
ALBUMIN: 4.6 g/dL (ref 3.5–5.2)
ALT: 26 U/L (ref 0–53)
AST: 19 U/L (ref 0–37)
Alkaline Phosphatase: 49 U/L (ref 39–117)
BUN: 14 mg/dL (ref 6–23)
CALCIUM: 9.3 mg/dL (ref 8.4–10.5)
CHLORIDE: 103 meq/L (ref 96–112)
CO2: 30 meq/L (ref 19–32)
CREATININE: 0.85 mg/dL (ref 0.50–1.35)
GFR, Est African American: >89 mL/min
GLUCOSE: 85 mg/dL (ref 70–99)
POTASSIUM: 4 meq/L (ref 3.5–5.3)
Sodium: 140 mEq/L (ref 135–145)
Total Bilirubin: 1 mg/dL (ref 0.2–1.2)
Total Protein: 7.1 g/dL (ref 6.0–8.3)

## 2014-01-25 LAB — LIPID PANEL
CHOLESTEROL: 189 mg/dL (ref 0–200)
HDL: 41 mg/dL (ref >39)
LDL Cholesterol: 96 mg/dL (ref 0–99)
TRIGLYCERIDES: 258 mg/dL — AB (ref <150)
Total CHOL/HDL Ratio: 4.6 Ratio
VLDL: 52 mg/dL — AB (ref 0–40)

## 2014-01-25 LAB — GLUCOSE, POCT (MANUAL RESULT ENTRY): POC Glucose: 87 mg/dl (ref 70–99)

## 2014-01-25 NOTE — Progress Notes (Signed)
MRN: [id number] Name: Paul [name]  Sex: male Age: 43 y.o. DOB: [birth year]  Allergies: Codeine  Chief Complaint  Patient presents with  . Follow-up    HPI: Patient is 43 y.o. male who has to of diabetes hyperlipidemia comes today for followup, he has modified his diet and is currently taking metformin 500 mg twice a day her, patient currently denies any hypoglycemic symptoms but does not check his blood sugar regularly, his hemoglobin A1c has trended down to 5.4%, for hyperlipidemia in the past he was prescribed Lopid as per patient he did not take this medication because he was afraid of side effects but he has tried to modify his diet. Patient denies any acute symptoms. Patient reported to have noticed a skin tag on his chest wall and wants these to be removed.  Past Medical History  Diagnosis Date  . Sleep apnea   . Diabetes mellitus without complication   . Sleep apnea     Past Surgical History  Procedure Laterality Date  . Abdominal surgery    . Knee surgery        Medication List       This list is accurate as of: 01/25/14  3:39 PM.  Always use your most recent med list.               gemfibrozil 600 MG tablet  Commonly known as:  LOPID  Take 1 tablet (600 mg total) by mouth 2 (two) times daily before a meal.     metFORMIN 500 MG tablet  Commonly known as:  GLUCOPHAGE  Take 1 tablet (500 mg total) by mouth 2 (two) times daily with a meal. Start on 05/26/2013     oxyCODONE-acetaminophen 5-325 MG per tablet  Commonly known as:  PERCOCET  Take 1-2 tablets by mouth every 4 (four) hours as needed.     traMADol 50 MG tablet  Commonly known as:  ULTRAM  Take 1 tablet (50 mg total) by mouth every 8 (eight) hours as needed.        No orders of the defined types were placed in this encounter.    Immunization History  Administered Date(s) Administered  . Influenza,inj,Quad PF,36+ Mos 06/04/2013    Family History  Problem Relation Age of Onset  .  Hypertension Other   . Diabetes Other   . Sleep apnea Other     History  Substance Use Topics  . Smoking status: Never Smoker   . Smokeless tobacco: Not on file  . Alcohol Use: Yes    Review of Systems   As noted in HPI  Filed Vitals:   01/25/14 1514  BP: 120/79  Pulse: 64  Temp: 98 F (36.7 C)  Resp: 16    Physical Exam  Physical Exam  Constitutional: No distress.  Eyes: EOM are normal. Pupils are equal, round, and reactive to light.  Cardiovascular: Normal rate and regular rhythm.   Pulmonary/Chest: Breath sounds normal. No respiratory distress. He has no wheezes. He has no rales.  Abdominal: Soft. He exhibits no distension.  Skin:  2 skin tags on left side chest wall     CBC    Component Value Date/Time   WBC 7.4 09/02/2013 0909   RBC 5.19 09/02/2013 0909   HGB 15.7 09/02/2013 0909   HCT 43.6 09/02/2013 0909   PLT 200 09/02/2013 0909   MCV 84.0 09/02/2013 0909   LYMPHSABS 1.9 09/02/2013 0909   MONOABS 0.4 09/02/2013 0909   EOSABS 0.8*  09/02/2013 0909   BASOSABS 0.1 09/02/2013 0909    CMP     Component Value Date/Time   NA 140 09/02/2013 0909   K 4.4 09/02/2013 0909   CL 101 09/02/2013 0909   CO2 29 09/02/2013 0909   GLUCOSE 113* 09/02/2013 0909   BUN 15 09/02/2013 0909   CREATININE 0.96 09/02/2013 0909   CREATININE 1.05 05/24/2013 0623   CALCIUM 9.3 09/02/2013 0909   PROT 6.7 09/02/2013 0909   ALBUMIN 4.7 09/02/2013 0909   AST 15 09/02/2013 0909   ALT 22 09/02/2013 0909   ALKPHOS 56 09/02/2013 0909   BILITOT 0.9 09/02/2013 0909   GFRNONAA >89 09/02/2013 0909   GFRNONAA 85* 05/24/2013 0623   GFRAA >89 09/02/2013 0909   GFRAA >90 05/24/2013 0623    Lab Results  Component Value Date/Time   CHOL 202* 09/02/2013  9:09 AM    No components found with this basename: hga1c    Lab Results  Component Value Date/Time   AST 15 09/02/2013  9:09 AM    Assessment and Plan  Diabetes mellitus due to underlying condition without complications - Plan: Results for orders placed in visit on  01/25/14  GLUCOSE, POCT (MANUAL RESULT ENTRY)      Result Value Ref Range   POC Glucose 87  70 - 99 mg/dl   Recent A1c was 5.4%, diabetes is too tightly controlled, I have advised patient to check his blood sugar at home also he was cut down metformin to once daily. Continue with diabetes maintaining.  Dyslipidemia - Plan:  will repeat Lipid panel, patient is currently not on any medication.  Screening - Plan: COMPLETE METABOLIC PANEL WITH GFR, Vit D  25 hydroxy (rtn osteoporosis monitoring)  Skin tag - Plan: Ambulatory referral to Dermatology   Return in about 3 months (around 04/27/2014) for diabetes, hyperipidemia.  Lorayne Marek, MD

## 2014-01-25 NOTE — Progress Notes (Signed)
Patient states here for follow up on his diabetes Patient requesting lab work to check his cholesterol Currently fasting

## 2014-01-25 NOTE — Patient Instructions (Signed)
Diabetes Mellitus and Food It is important for you to manage your blood sugar (glucose) level. Your blood glucose level can be greatly affected by what you eat. Eating healthier foods in the appropriate amounts throughout the day at about the same time each day will help you control your blood glucose level. It can also help slow or prevent worsening of your diabetes mellitus. Healthy eating may even help you improve the level of your blood pressure and reach or maintain a healthy weight.  HOW CAN FOOD AFFECT ME? Carbohydrates Carbohydrates affect your blood glucose level more than any other type of food. Your dietitian will help you determine how many carbohydrates to eat at each meal and teach you how to count carbohydrates. Counting carbohydrates is important to keep your blood glucose at a healthy level, especially if you are using insulin or taking certain medicines for diabetes mellitus. Alcohol Alcohol can cause sudden decreases in blood glucose (hypoglycemia), especially if you use insulin or take certain medicines for diabetes mellitus. Hypoglycemia can be a life-threatening condition. Symptoms of hypoglycemia (sleepiness, dizziness, and disorientation) are similar to symptoms of having too much alcohol.  If your health care provider has given you approval to drink alcohol, do so in moderation and use the following guidelines:  Women should not have more than one drink per day, and men should not have more than two drinks per day. One drink is equal to:  12 oz of beer.  5 oz of wine.  1 oz of hard liquor.  Do not drink on an empty stomach.  Keep yourself hydrated. Have water, diet soda, or unsweetened iced tea.  Regular soda, juice, and other mixers might contain a lot of carbohydrates and should be counted. WHAT FOODS ARE NOT RECOMMENDED? As you make food choices, it is important to remember that all foods are not the same. Some foods have fewer nutrients per serving than other  foods, even though they might have the same number of calories or carbohydrates. It is difficult to get your body what it needs when you eat foods with fewer nutrients. Examples of foods that you should avoid that are high in calories and carbohydrates but low in nutrients include:  Trans fats (most processed foods list trans fats on the Nutrition Facts label).  Regular soda.  Juice.  Candy.  Sweets, such as cake, pie, doughnuts, and cookies.  Fried foods. WHAT FOODS CAN I EAT? Have nutrient-rich foods, which will nourish your body and keep you healthy. The food you should eat also will depend on several factors, including:  The calories you need.  The medicines you take.  Your weight.  Your blood glucose level.  Your blood pressure level.  Your cholesterol level. You also should eat a variety of foods, including:  Protein, such as meat, poultry, fish, tofu, nuts, and seeds (lean animal proteins are best).  Fruits.  Vegetables.  Dairy products, such as milk, cheese, and yogurt (low fat is best).  Breads, grains, pasta, cereal, rice, and beans.  Fats such as olive oil, trans fat-free margarine, canola oil, avocado, and olives. DOES EVERYONE WITH DIABETES MELLITUS HAVE THE SAME MEAL PLAN? Because every person with diabetes mellitus is different, there is not one meal plan that works for everyone. It is very important that you meet with a dietitian who will help you create a meal plan that is just right for you. Document Released: 03/13/2005 Document Revised: 06/21/2013 Document Reviewed: 05/13/2013 [employer] Patient Information 2015 [employer]. This   information is not intended to replace advice given to you by your health care provider. Make sure you discuss any questions you have with your health care provider.  

## 2014-01-26 ENCOUNTER — Telehealth: Payer: Self-pay

## 2014-01-26 LAB — VITAMIN D 25 HYDROXY (VIT D DEFICIENCY, FRACTURES): Vit D, 25-Hydroxy: 55 ng/mL (ref 30–89)

## 2014-01-26 NOTE — Telephone Encounter (Signed)
Patient not available Message left to return our call 

## 2014-01-26 NOTE — Telephone Encounter (Signed)
Message copied by Dorothe Pea on Thu Jan 26, 2014  2:08 PM ------      Message from: Lorayne Marek      Created: Thu Jan 26, 2014  9:35 AM       Blood work reviewed,  Noticed his LDL is improved but still has  elevated triglycerides, advise patient for low fat diet. Will repeat lipid panel on next visit.       ------

## 2014-02-14 ENCOUNTER — Other Ambulatory Visit: Payer: Self-pay | Admitting: Internal Medicine

## 2014-02-14 DIAGNOSIS — E139 Other specified diabetes mellitus without complications: Secondary | ICD-10-CM

## 2014-04-03 ENCOUNTER — Encounter: Attending: Internal Medicine

## 2014-04-03 VITALS — Wt 220.0 lb

## 2014-04-03 DIAGNOSIS — E089 Diabetes mellitus due to underlying condition without complications: Secondary | ICD-10-CM | POA: Diagnosis not present

## 2014-04-03 DIAGNOSIS — Z713 Dietary counseling and surveillance: Secondary | ICD-10-CM | POA: Diagnosis not present

## 2014-04-04 NOTE — Progress Notes (Signed)
Class Time: 5:30-6:30 PM  Patient was seen on 04/03/2014 for a review of the series of three diabetes self-management courses at the [facility]. The following learning objectives were met by the patient during this class:    Reviewed blood glucose monitoring and interpretation including the recommended target ranges and Hgb A1c.    Reviewed on carb counting, importance of regularly scheduled meals/snacks, and meal planning.    Reviewed the effects of physical activity on glucose levels and long-term glucose control.  Recommended goal of 150 minutes of physical activity/week.   Reviewed patient medications and discussed role of medication on blood glucose and possible side effects.   Discussed strategies to manage stress, psychosocial issues, and other obstacles to diabetes management.   Encouraged moderate weight reduction to improve glucose levels.     Reviewed short-term complications: hyper- and hypo-glycemia.  Discussed causes, symptoms, and treatment options.   Reviewed prevention, detection, and treatment of long-term complications.  Discussed the role of prolonged elevated glucose levels on body systems.  Goals:  Follow Diabetes Meal Plan as instructed  Eat 3 meals and 2 snacks, every 3-5 hrs  Limit carbohydrate intake to 45 grams carbohydrate/meal Limit carbohydrate intake to 15 grams carbohydrate/snack Add lean protein foods to meals/snacks  Monitor glucose levels as instructed by your doctor  Aim for goal of 15-30 mins of physical activity daily as tolerated  Bring food record and glucose log to your next nutrition visit   

## 2014-04-14 ENCOUNTER — Other Ambulatory Visit: Payer: Self-pay

## 2014-04-19 ENCOUNTER — Encounter: Payer: Self-pay | Admitting: Cardiovascular Disease

## 2014-04-19 ENCOUNTER — Ambulatory Visit (INDEPENDENT_AMBULATORY_CARE_PROVIDER_SITE_OTHER): Admitting: Cardiovascular Disease

## 2014-04-19 VITALS — BP 130/88 | HR 73 | Ht 70.0 in | Wt 229.0 lb

## 2014-04-19 DIAGNOSIS — Z8249 Family history of ischemic heart disease and other diseases of the circulatory system: Secondary | ICD-10-CM

## 2014-04-19 DIAGNOSIS — E785 Hyperlipidemia, unspecified: Secondary | ICD-10-CM

## 2014-04-19 DIAGNOSIS — R0600 Dyspnea, unspecified: Secondary | ICD-10-CM

## 2014-04-19 NOTE — Patient Instructions (Signed)
Your physician recommends that you schedule a follow-up appointment in:  About 6-8 weeks.   Your physician has requested that you have an exercise tolerance test. Can be done with NP or PA or at [facility].  For further information please visit [email or web address]. Please also follow instruction sheet, as given.   Your physician has requested that you have an echocardiogram. Echocardiography is a painless test that uses sound waves to create images of your heart. It provides your doctor with information about the size and shape of your heart and how well your heart's chambers and valves are working. This procedure takes approximately one hour. There are no restrictions for this procedure.

## 2014-04-19 NOTE — Progress Notes (Signed)
History of Present Illness: 43 yo Paul [name] with history of DM, OSA, HLD here today as a new patient to establish cardiology care. He has no prior history of cardiac disease. He has a family history of CAD with both grandfathers having heart attacks. He denies any chest pain. He does have occasional dyspnea. He is very active. No syncope or near syncope. Recent diagnosis of DM.   Primary Care Physician: Lorayne Marek  Last Lipid Profile:Lipid Panel     Component Value Date/Time   CHOL 189 01/25/2014 1541   TRIG 258* 01/25/2014 1541   HDL 41 01/25/2014 1541   CHOLHDL 4.6 01/25/2014 1541   VLDL 52* 01/25/2014 1541   [place] 96 01/25/2014 1541    Past Medical History  Diagnosis Date  . Sleep apnea   . Diabetes mellitus without complication   . Hyperlipidemia   . Kidney stones     Past Surgical History  Procedure Laterality Date  . Umbilical hernia repair    . Knee arthroscopy Right     Current Outpatient Prescriptions  Medication Sig Dispense Refill  . metFORMIN (GLUCOPHAGE) 500 MG tablet TAKE 1 TABLET BY MOUTH DAILY WITH A MEAL.      Marland Kitchen gemfibrozil (LOPID) 600 MG tablet Take 1 tablet (600 mg total) by mouth 2 (two) times daily before a meal.  30 tablet  2  . oxyCODONE-acetaminophen (PERCOCET) 5-325 MG per tablet Take 1-2 tablets by mouth every 4 (four) hours as needed.  20 tablet  0  . traMADol (ULTRAM) 50 MG tablet Take 1 tablet (50 mg total) by mouth every 8 (eight) hours as needed.  30 tablet  0   No current facility-administered medications for this visit.    Allergies  Allergen Reactions  . Codeine Nausea And Vomiting and Other (See Comments)    headache    History   Social History  . Marital Status: Married    Spouse Name: N/A    Number of Children: 2  . Years of Education: N/A   Occupational History  . Security officer    Social History Main Topics  . Smoking status: Never Smoker   . Smokeless tobacco: Not on file  . Alcohol Use: Yes     Comment: Social    . Drug Use: No  . Sexual Activity: Not on file   Other Topics Concern  . Not on file   Social History Narrative  . No narrative on file    Family History  Problem Relation Age of Onset  . Hypertension Father   . Diabetes Mother   . Sleep apnea Other   . Heart attack Maternal Grandfather   . Heart attack Paternal Grandfather     Review of Systems:  As stated in the HPI and otherwise negative.   BP 130/88  Pulse 73  Ht 5\' 10"  (1.778 m)  Wt 229 lb (103.874 kg)  BMI 32.86 kg/m2  Physical Examination: General: Well developed, well nourished, NAD HEENT: OP clear, mucus membranes moist SKIN: warm, dry. No rashes. Neuro: No focal deficits Musculoskeletal: Muscle strength 5/5 all ext Psychiatric: Mood and affect normal Neck: No JVD, no carotid bruits, no thyromegaly, no lymphadenopathy. Lungs:Clear bilaterally, no wheezes, rhonci, crackles Cardiovascular: Regular rate and rhythm. No murmurs, gallops or rubs. Abdomen:Soft. Bowel sounds present. Non-tender.  Extremities: No lower extremity edema. Pulses are 2 + in the bilateral DP/PT.  EKG: NSR, rate 73 bpm.   Assessment and Plan:   1. Dyspnea: Will check echo  to assess LV function, exclude valve disease  2. Family history of premature CAD: Pt is concerned that he may have CAD and wishes to have screening. Will arrange exercise treadmill stress test.

## 2014-05-05 ENCOUNTER — Telehealth (HOSPITAL_COMMUNITY): Payer: Self-pay

## 2014-05-05 NOTE — Telephone Encounter (Signed)
Encounter complete. 

## 2014-05-09 ENCOUNTER — Telehealth (HOSPITAL_COMMUNITY): Payer: Self-pay

## 2014-05-09 NOTE — Telephone Encounter (Signed)
Encounter complete. 

## 2014-05-10 ENCOUNTER — Ambulatory Visit (HOSPITAL_COMMUNITY)
Admission: RE | Admit: 2014-05-10 | Discharge: 2014-05-10 | Disposition: A | Discharge status: Home / Self Care | Source: Ambulatory Visit | Attending: Cardiovascular Disease | Admitting: Cardiovascular Disease

## 2014-05-10 ENCOUNTER — Ambulatory Visit (HOSPITAL_BASED_OUTPATIENT_CLINIC_OR_DEPARTMENT_OTHER)
Admission: RE | Admit: 2014-05-10 | Discharge: 2014-05-10 | Disposition: A | Discharge status: Home / Self Care | Source: Ambulatory Visit | Attending: Cardiovascular Disease | Admitting: Cardiovascular Disease

## 2014-05-10 DIAGNOSIS — Z8249 Family history of ischemic heart disease and other diseases of the circulatory system: Secondary | ICD-10-CM

## 2014-05-10 DIAGNOSIS — R0600 Dyspnea, unspecified: Secondary | ICD-10-CM | POA: Diagnosis not present

## 2014-05-10 DIAGNOSIS — R0602 Shortness of breath: Secondary | ICD-10-CM

## 2014-05-10 DIAGNOSIS — E119 Type 2 diabetes mellitus without complications: Secondary | ICD-10-CM | POA: Diagnosis not present

## 2014-05-10 NOTE — Progress Notes (Signed)
2D Echo Performed 05/10/2014    Marygrace Drought, RCS

## 2014-05-10 NOTE — Procedures (Signed)
Exercise Treadmill Test  Pre-Exercise Testing Evaluation Rhythm: normal sinus  Rate:                  Test  Exercise Tolerance Test Ordering MD: Murvin Natal, MD  Interpreting MD:   Unique Test No: 1 Treadmill:  1  Indication for PRF:[id number]  Contraindication to ETT: No   Stress Modality: exercise - treadmill  Cardiac Imaging Performed: non   Protocol: standard Bruce - maximal  Max BP:  181/90  Max MPHR (bpm):  175 85% MPR (bpm):  148  MPHR obtained (bpm):  181 % MPHR obtained:  103  Reached 85% MPHR (min:sec):  11:00 Total Exercise Time (min-sec):  14:23  Workload in METS:  17.20 Borg Scale:   Reason ETT Terminated:  fatigue    ST Segment Analysis At Rest: normal ST segments - no evidence of significant ST depression With Exercise: no evidence of significant ST depression  Other Information Arrhythmia:  No Angina during ETT:  absent (0) Quality of ETT:  diagnostic  ETT Interpretation:  normal - no evidence of ischemia by ST analysis  Comments: No GXT  Recommendations: Follow up with Dr. Julianne Handler

## 2014-05-11 ENCOUNTER — Telehealth: Payer: Self-pay | Admitting: Cardiovascular Disease

## 2014-05-11 NOTE — Telephone Encounter (Signed)
Patient's wife informed of both test results. Echo and Stress Test. Voiced good understanding.

## 2014-05-11 NOTE — Telephone Encounter (Signed)
New message      Returning Pat's call to get test results.  OK to tell wife

## 2014-05-29 ENCOUNTER — Ambulatory Visit (INDEPENDENT_AMBULATORY_CARE_PROVIDER_SITE_OTHER): Admitting: Cardiovascular Disease

## 2014-05-29 ENCOUNTER — Encounter: Payer: Self-pay | Admitting: Cardiovascular Disease

## 2014-05-29 VITALS — BP 130/94 | HR 68 | Ht 70.5 in | Wt 231.1 lb

## 2014-05-29 DIAGNOSIS — R0600 Dyspnea, unspecified: Secondary | ICD-10-CM

## 2014-05-29 NOTE — Patient Instructions (Signed)
Your physician wants you to follow-up in:  2 years. You will receive a reminder letter in the mail two months in advance. If you don't receive a letter, please call our office to schedule the follow-up appointment.

## 2014-05-29 NOTE — Progress Notes (Signed)
History of Present Illness: 43 yo male with history of DM, OSA, HLD here today for cardiac follow up. I saw him as a new patient to establish cardiology care 04/19/14. He has no prior history of cardiac disease. He has a family history of CAD with both grandfathers having heart attacks. He denied any chest pain but noted dyspnea. I arranged an echo and an exercise stress test. Exercise stress test 05/10/14 with good exercise tolerance and no ischemia. Echo 05/10/13 with normal LV function,no valve disease.   He is here to follow up his studies. Dyspnea resolved. Feeling better. No chest pain. Losing weight and exercising every day.   Primary Care Physician: Lorayne Marek  Last Lipid Profile:Lipid Panel     Component Value Date/Time   CHOL 189 01/25/2014 1541   TRIG 258* 01/25/2014 1541   HDL 41 01/25/2014 1541   CHOLHDL 4.6 01/25/2014 1541   VLDL 52* 01/25/2014 1541   [place] 96 01/25/2014 1541    Past Medical History  Diagnosis Date  . Sleep apnea   . Diabetes mellitus without complication   . Hyperlipidemia   . Kidney stones     Past Surgical History  Procedure Laterality Date  . Umbilical hernia repair    . Knee arthroscopy Right     Current Outpatient Prescriptions  Medication Sig Dispense Refill  . metFORMIN (GLUCOPHAGE) 500 MG tablet TAKE 1 TABLET BY MOUTH DAILY WITH A MEAL.     No current facility-administered medications for this visit.    Allergies  Allergen Reactions  . Codeine Nausea And Vomiting and Other (See Comments)    headache    History   Social History  . Marital Status: Married    Spouse Name: N/A    Number of Children: 2  . Years of Education: N/A   Occupational History  . Security officer    Social History Main Topics  . Smoking status: Never Smoker   . Smokeless tobacco: Not on file  . Alcohol Use: Yes     Comment: Social  . Drug Use: No  . Sexual Activity: Not on file   Other Topics Concern  . Not on file   Social  History Narrative    Family History  Problem Relation Age of Onset  . Hypertension Father   . Diabetes Mother   . Sleep apnea Other   . Heart attack Maternal Grandfather   . Heart attack Paternal Grandfather     Review of Systems:  As stated in the HPI and otherwise negative.   BP 130/94 mmHg  Pulse 68  Ht 5' 10.5" (1.791 m)  Wt 231 lb 1.9 oz (104.835 kg)  BMI 32.68 kg/m2  SpO2 98%  Physical Examination: General: Well developed, well nourished, NAD HEENT: OP clear, mucus membranes moist SKIN: warm, dry. No rashes. Neuro: No focal deficits Musculoskeletal: Muscle strength 5/5 all ext Psychiatric: Mood and affect normal Neck: No JVD, no carotid bruits, no thyromegaly, no lymphadenopathy. Lungs:Clear bilaterally, no wheezes, rhonci, crackles Cardiovascular: Regular rate and rhythm. No murmurs, gallops or rubs. Abdomen:Soft. Bowel sounds present. Non-tender.  Extremities: No lower extremity edema. Pulses are 2 + in the bilateral DP/PT.  Echo 05/10/14: Left ventricle: The cavity size was normal. Wall thickness was normal. Systolic function was normal. The estimated ejection fraction was in the range of 55% to 60%. Wall motion was normal; there were no regional wall motion abnormalities. Left ventricular diastolic function parameters were normal. - Left atrium: LA Volume/ BSA =  18.6 ml/m2. The atrium was normal in size. - Tricuspid valve: There was trivial regurgitation. - Pulmonary arteries: PA peak pressure: 10 mm Hg (S). Impressions: - Essentially normal echo.  Assessment and Plan:   1. Dyspnea: Resolved. Echo with normal LV function  2. Family history of premature CAD: Exercise stress test 05/10/14 with good exercise tolerance and no ischemia.   Will see him back in 2 years.

## 2014-08-11 ENCOUNTER — Ambulatory Visit: Attending: Internal Medicine | Admitting: Internal Medicine

## 2014-08-11 ENCOUNTER — Encounter: Payer: Self-pay | Admitting: Internal Medicine

## 2014-08-11 VITALS — BP 136/88 | HR 68 | Temp 98.0°F | Resp 16 | Wt 229.0 lb

## 2014-08-11 DIAGNOSIS — E119 Type 2 diabetes mellitus without complications: Secondary | ICD-10-CM | POA: Insufficient documentation

## 2014-08-11 DIAGNOSIS — R030 Elevated blood-pressure reading, without diagnosis of hypertension: Secondary | ICD-10-CM

## 2014-08-11 DIAGNOSIS — E139 Other specified diabetes mellitus without complications: Secondary | ICD-10-CM

## 2014-08-11 DIAGNOSIS — E785 Hyperlipidemia, unspecified: Secondary | ICD-10-CM | POA: Diagnosis not present

## 2014-08-11 DIAGNOSIS — Z23 Encounter for immunization: Secondary | ICD-10-CM | POA: Diagnosis not present

## 2014-08-11 DIAGNOSIS — IMO0001 Reserved for inherently not codable concepts without codable children: Secondary | ICD-10-CM

## 2014-08-11 LAB — COMPLETE METABOLIC PANEL WITH GFR
ALT: 25 U/L (ref 0–53)
AST: 16 U/L (ref 0–37)
Albumin: 4.4 g/dL (ref 3.5–5.2)
Alkaline Phosphatase: 55 U/L (ref 39–117)
BILIRUBIN TOTAL: 1.1 mg/dL (ref 0.2–1.2)
BUN: 14 mg/dL (ref 6–23)
CO2: 28 mEq/L (ref 19–32)
CREATININE: 0.86 mg/dL (ref 0.50–1.35)
Calcium: 9.6 mg/dL (ref 8.4–10.5)
Chloride: 103 mEq/L (ref 96–112)
GFR, Est African American: >89 mL/min
GFR, Est Non African American: >89 mL/min
Glucose, Bld: 105 mg/dL — ABNORMAL HIGH (ref 70–99)
Potassium: 4.6 mEq/L (ref 3.5–5.3)
Sodium: 140 mEq/L (ref 135–145)
Total Protein: 7 g/dL (ref 6.0–8.3)

## 2014-08-11 LAB — LIPID PANEL
CHOL/HDL RATIO: 5.5 ratio
Cholesterol: 214 mg/dL — ABNORMAL HIGH (ref 0–200)
HDL: 39 mg/dL — ABNORMAL LOW (ref >39)
LDL Cholesterol: 111 mg/dL — ABNORMAL HIGH (ref 0–99)
TRIGLYCERIDES: 318 mg/dL — AB (ref <150)
VLDL: 64 mg/dL — AB (ref 0–40)

## 2014-08-11 LAB — POCT GLYCOSYLATED HEMOGLOBIN (HGB A1C): Hemoglobin A1C: 6.1

## 2014-08-11 MED ORDER — METFORMIN HCL 500 MG PO TABS: ORAL_TABLET | ORAL | Status: DC

## 2014-08-11 NOTE — Progress Notes (Signed)
MRN: [id number] Name: Paul [name]  Sex: male Age: 44 y.o. DOB: 1971-06-10  Allergies: Codeine  Chief Complaint  Patient presents with  . Follow-up    HPI: Patient is 44 y.o. male who history of diabetes hyperlipidemia comes today for followup , denies any hypoglycemic symptoms currently taking metformin 500 mg daily, his hemoglobin A1c today 6.1%, his blood pressure is borderline elevated, patient denies any headache dizziness chest and shortness of breath, previous blood work reviewed noticed elevated triglycerides, patient has already been advised for low-fat diet.  Past Medical History  Diagnosis Date  . Sleep apnea   . Diabetes mellitus without complication   . Hyperlipidemia   . Kidney stones     Past Surgical History  Procedure Laterality Date  . Umbilical hernia repair    . Knee arthroscopy Right       Medication List       This list is accurate as of: 08/11/14 10:36 AM.  Always use your most recent med list.               metFORMIN 500 MG tablet  Commonly known as:  GLUCOPHAGE  TAKE 1 TABLET BY MOUTH DAILY WITH A MEAL.        Meds ordered this encounter  Medications  . metFORMIN (GLUCOPHAGE) 500 MG tablet    Sig: TAKE 1 TABLET BY MOUTH DAILY WITH A MEAL.    Dispense:  30 tablet    Refill:  3    Immunization History  Administered Date(s) Administered  . Influenza,inj,Quad PF,36+ Mos 06/04/2013, 08/11/2014    Family History  Problem Relation Age of Onset  . Hypertension Father   . Diabetes Mother   . Sleep apnea Other   . Heart attack Maternal Grandfather   . Heart attack Paternal Grandfather     History  Substance Use Topics  . Smoking status: Never Smoker   . Smokeless tobacco: Not on file  . Alcohol Use: Yes     Comment: Social    Review of Systems   As noted in HPI  Filed Vitals:   08/11/14 1018  BP: 136/88  Pulse: 68  Temp: 98 F (36.7 C)  Resp: 16    Physical Exam  Physical Exam  Constitutional: No  distress.  Eyes: EOM are normal. Pupils are equal, round, and reactive to light.  Cardiovascular: Normal rate and regular rhythm.   Pulmonary/Chest: Breath sounds normal. No respiratory distress. He has no wheezes. He has no rales.  Musculoskeletal: He exhibits no edema.    CBC    Component Value Date/Time   WBC 8.9 01/25/2014 1541   RBC 4.90 01/25/2014 1541   HGB 14.9 01/25/2014 1541   HCT 42.1 01/25/2014 1541   PLT 208 01/25/2014 1541   MCV 85.9 01/25/2014 1541   LYMPHSABS 2.3 01/25/2014 1541   MONOABS 0.6 01/25/2014 1541   EOSABS 0.5 01/25/2014 1541   BASOSABS 0.1 01/25/2014 1541    CMP     Component Value Date/Time   NA 140 01/25/2014 1541   K 4.0 01/25/2014 1541   CL 103 01/25/2014 1541   CO2 30 01/25/2014 1541   GLUCOSE 85 01/25/2014 1541   BUN 14 01/25/2014 1541   CREATININE 0.85 01/25/2014 1541   CREATININE 1.05 05/24/2013 0623   CALCIUM 9.3 01/25/2014 1541   PROT 7.1 01/25/2014 1541   ALBUMIN 4.6 01/25/2014 1541   AST 19 01/25/2014 1541   ALT 26 01/25/2014 1541   ALKPHOS 49 01/25/2014 1541  BILITOT 1.0 01/25/2014 1541   GFRNONAA >89 01/25/2014 1541   GFRNONAA 85* 05/24/2013 0623   GFRAA >89 01/25/2014 1541   GFRAA >90 05/24/2013 0623    Lab Results  Component Value Date/Time   CHOL 189 01/25/2014 03:41 PM    No components found for: HGA1C  Lab Results  Component Value Date/Time   AST 19 01/25/2014 03:41 PM    Assessment and Plan  Other specified diabetes mellitus without complications - Plan:  Results for orders placed or performed in visit on 08/11/14  HgB A1c  Result Value Ref Range   Hemoglobin A1C 6.10    Diabetes is well controlled, continue with current meds. Repeat A1c on the following visit. , metFORMIN (GLUCOPHAGE) 500 MG tablet, COMPLETE METABOLIC PANEL WITH GFR  Dyslipidemia - Plan:advised patient follow for diet, recheck  Lipid panel  Needs flu shot Flu shot given today.  Elevated BP Advised patient for DASH  diet.  Health Maintenance  -Vaccinations:  -flu shot given today   Return in about 4 months (around 12/10/2014) for diabetes.  Lorayne Marek, MD

## 2014-08-11 NOTE — Progress Notes (Signed)
Patient here for follow up on his diabetes Requesting blood work to check his cholesterol

## 2014-08-11 NOTE — Patient Instructions (Signed)
Diabetes Mellitus and Food It is important for you to manage your blood sugar (glucose) level. Your blood glucose level can be greatly affected by what you eat. Eating healthier foods in the appropriate amounts throughout the day at about the same time each day will help you control your blood glucose level. It can also help slow or prevent worsening of your diabetes mellitus. Healthy eating may even help you improve the level of your blood pressure and reach or maintain a healthy weight.  HOW CAN FOOD AFFECT ME? Carbohydrates Carbohydrates affect your blood glucose level more than any other type of food. Your dietitian will help you determine how many carbohydrates to eat at each meal and teach you how to count carbohydrates. Counting carbohydrates is important to keep your blood glucose at a healthy level, especially if you are using insulin or taking certain medicines for diabetes mellitus. Alcohol Alcohol can cause sudden decreases in blood glucose (hypoglycemia), especially if you use insulin or take certain medicines for diabetes mellitus. Hypoglycemia can be a life-threatening condition. Symptoms of hypoglycemia (sleepiness, dizziness, and disorientation) are similar to symptoms of having too much alcohol.  If your health care provider has given you approval to drink alcohol, do so in moderation and use the following guidelines:  Women should not have more than one drink per day, and men should not have more than two drinks per day. One drink is equal to:  12 oz of beer.  5 oz of wine.  1 oz of hard liquor.  Do not drink on an empty stomach.  Keep yourself hydrated. Have water, diet soda, or unsweetened iced tea.  Regular soda, juice, and other mixers might contain a lot of carbohydrates and should be counted. WHAT FOODS ARE NOT RECOMMENDED? As you make food choices, it is important to remember that all foods are not the same. Some foods have fewer nutrients per serving than other  foods, even though they might have the same number of calories or carbohydrates. It is difficult to get your body what it needs when you eat foods with fewer nutrients. Examples of foods that you should avoid that are high in calories and carbohydrates but low in nutrients include:  Trans fats (most processed foods list trans fats on the Nutrition Facts label).  Regular soda.  Juice.  Candy.  Sweets, such as cake, pie, doughnuts, and cookies.  Fried foods. WHAT FOODS CAN I EAT? Have nutrient-rich foods, which will nourish your body and keep you healthy. The food you should eat also will depend on several factors, including:  The calories you need.  The medicines you take.  Your weight.  Your blood glucose level.  Your blood pressure level.  Your cholesterol level. You also should eat a variety of foods, including:  Protein, such as meat, poultry, fish, tofu, nuts, and seeds (lean animal proteins are best).  Fruits.  Vegetables.  Dairy products, such as milk, cheese, and yogurt (low fat is best).  Breads, grains, pasta, cereal, rice, and beans.  Fats such as olive oil, trans fat-free margarine, canola oil, avocado, and olives. DOES EVERYONE WITH DIABETES MELLITUS HAVE THE SAME MEAL PLAN? Because every person with diabetes mellitus is different, there is not one meal plan that works for everyone. It is very important that you meet with a dietitian who will help you create a meal plan that is just right for you. Document Released: 03/13/2005 Document Revised: 06/21/2013 Document Reviewed: 05/13/2013 [employer] Patient Information 2015 [employer]. This   information is not intended to replace advice given to you by your health care provider. Make sure you discuss any questions you have with your health care provider. Fat and Cholesterol Control Diet Fat and cholesterol levels in your blood and organs are influenced by your diet. High levels of fat and cholesterol may lead to  diseases of the heart, small and large blood vessels, gallbladder, liver, and pancreas. CONTROLLING FAT AND CHOLESTEROL WITH DIET Although exercise and lifestyle factors are important, your diet is key. That is because certain foods are known to raise cholesterol and others to lower it. The goal is to balance foods for their effect on cholesterol and more importantly, to replace saturated and trans fat with other types of fat, such as monounsaturated fat, polyunsaturated fat, and omega-3 fatty acids. On average, a person should consume no more than 15 to 17 g of saturated fat daily. Saturated and trans fats are considered "bad" fats, and they will raise LDL cholesterol. Saturated fats are primarily found in animal products such as meats, butter, and cream. However, that does not mean you need to give up all your favorite foods. Today, there are good tasting, low-fat, low-cholesterol substitutes for most of the things you like to eat. Choose low-fat or nonfat alternatives. Choose round or loin cuts of red meat. These types of cuts are lowest in fat and cholesterol. Chicken (without the skin), fish, veal, and ground turkey breast are great choices. Eliminate fatty meats, such as hot dogs and salami. Even shellfish have little or no saturated fat. Have a 3 oz (85 g) portion when you eat lean meat, poultry, or fish. Trans fats are also called "partially hydrogenated oils." They are oils that have been scientifically manipulated so that they are solid at room temperature resulting in a longer shelf life and improved taste and texture of foods in which they are added. Trans fats are found in stick margarine, some tub margarines, cookies, crackers, and baked goods.  When baking and cooking, oils are a great substitute for butter. The monounsaturated oils are especially beneficial since it is believed they lower LDL and raise HDL. The oils you should avoid entirely are saturated tropical oils, such as coconut and palm.   Remember to eat a lot from food groups that are naturally free of saturated and trans fat, including fish, fruit, vegetables, beans, grains (barley, rice, couscous, bulgur wheat), and pasta (without cream sauces).  IDENTIFYING FOODS THAT LOWER FAT AND CHOLESTEROL  Soluble fiber may lower your cholesterol. This type of fiber is found in fruits such as apples, vegetables such as broccoli, potatoes, and carrots, legumes such as beans, peas, and lentils, and grains such as barley. Foods fortified with plant sterols (phytosterol) may also lower cholesterol. You should eat at least 2 g per day of these foods for a cholesterol lowering effect.  Read package labels to identify low-saturated fats, trans fat free, and low-fat foods at the supermarket. Select cheeses that have only 2 to 3 g saturated fat per ounce. Use a heart-healthy tub margarine that is free of trans fats or partially hydrogenated oil. When buying baked goods (cookies, crackers), avoid partially hydrogenated oils. Breads and muffins should be made from whole grains (whole-wheat or whole oat flour, instead of "flour" or "enriched flour"). Buy non-creamy canned soups with reduced salt and no added fats.  FOOD PREPARATION TECHNIQUES  Never deep-fry. If you must fry, either stir-fry, which uses very little fat, or use non-stick cooking sprays. When possible, broil,   bake, or roast meats, and steam vegetables. Instead of putting butter or margarine on vegetables, use lemon and herbs, applesauce, and cinnamon (for squash and sweet potatoes). Use nonfat yogurt, salsa, and low-fat dressings for salads.  LOW-SATURATED FAT / LOW-FAT FOOD SUBSTITUTES Meats / Saturated Fat (g)  Avoid: Steak, marbled (3 oz/85 g) / 11 g  Choose: Steak, lean (3 oz/85 g) / 4 g  Avoid: Hamburger (3 oz/85 g) / 7 g  Choose: Hamburger, lean (3 oz/85 g) / 5 g  Avoid: Ham (3 oz/85 g) / 6 g  Choose: Ham, lean cut (3 oz/85 g) / 2.4 g  Avoid: Chicken, with skin, dark meat (3  oz/85 g) / 4 g  Choose: Chicken, skin removed, dark meat (3 oz/85 g) / 2 g  Avoid: Chicken, with skin, light meat (3 oz/85 g) / 2.5 g  Choose: Chicken, skin removed, light meat (3 oz/85 g) / 1 g Dairy / Saturated Fat (g)  Avoid: Whole milk (1 cup) / 5 g  Choose: Low-fat milk, 2% (1 cup) / 3 g  Choose: Low-fat milk, 1% (1 cup) / 1.5 g  Choose: Skim milk (1 cup) / 0.3 g  Avoid: Hard cheese (1 oz/28 g) / 6 g  Choose: Skim milk cheese (1 oz/28 g) / 2 to 3 g  Avoid: Cottage cheese, 4% fat (1 cup) / 6.5 g  Choose: Low-fat cottage cheese, 1% fat (1 cup) / 1.5 g  Avoid: Ice cream (1 cup) / 9 g  Choose: Sherbet (1 cup) / 2.5 g  Choose: Nonfat frozen yogurt (1 cup) / 0.3 g  Choose: Frozen fruit bar / trace  Avoid: Whipped cream (1 tbs) / 3.5 g  Choose: Nondairy whipped topping (1 tbs) / 1 g Condiments / Saturated Fat (g)  Avoid: Mayonnaise (1 tbs) / 2 g  Choose: Low-fat mayonnaise (1 tbs) / 1 g  Avoid: Butter (1 tbs) / 7 g  Choose: Extra light margarine (1 tbs) / 1 g  Avoid: Coconut oil (1 tbs) / 11.8 g  Choose: Olive oil (1 tbs) / 1.8 g  Choose: Corn oil (1 tbs) / 1.7 g  Choose: Safflower oil (1 tbs) / 1.2 g  Choose: Sunflower oil (1 tbs) / 1.4 g  Choose: Soybean oil (1 tbs) / 2.4 g  Choose: Canola oil (1 tbs) / 1 g Document Released: 06/16/2005 Document Revised: 10/11/2012 Document Reviewed: 09/14/2013 ExitCare Patient Information 2015 [place]. This information is not intended to replace advice given to you by your health care provider. Make sure you discuss any questions you have with your health care provider. DASH Eating Plan DASH stands for "Dietary Approaches to Stop Hypertension." The DASH eating plan is a healthy eating plan that has been shown to reduce high blood pressure (hypertension). Additional health benefits may include reducing the risk of type 2 diabetes mellitus, heart disease, and stroke. The DASH eating plan may also help with weight  loss. WHAT DO I NEED TO KNOW ABOUT THE DASH EATING PLAN? For the DASH eating plan, you will follow these general guidelines:  Choose foods with a percent daily value for sodium of less than 5% (as listed on the food label).  Use salt-free seasonings or herbs instead of table salt or sea salt.  Check with your health care provider or pharmacist before using salt substitutes.  Eat lower-sodium products, often labeled as "lower sodium" or "no salt added."  Eat fresh foods.  Eat more vegetables, fruits, and low-fat dairy  products.  Choose whole grains. Look for the word "whole" as the first word in the ingredient list.  Choose fish and skinless chicken or [place] more often than red meat. Limit fish, poultry, and meat to 6 oz (170 g) each day.  Limit sweets, desserts, sugars, and sugary drinks.  Choose heart-healthy fats.  Limit cheese to 1 oz (28 g) per day.  Eat more home-cooked food and less restaurant, buffet, and fast food.  Limit fried foods.  Cook foods using methods other than frying.  Limit canned vegetables. If you do use them, rinse them well to decrease the sodium.  When eating at a restaurant, ask that your food be prepared with less salt, or no salt if possible. WHAT FOODS CAN I EAT? Seek help from a dietitian for individual calorie needs. Grains Whole grain or whole wheat bread. Brown rice. Whole grain or whole wheat pasta. Quinoa, bulgur, and whole grain cereals. Low-sodium cereals. Corn or whole wheat flour tortillas. Whole grain cornbread. Whole grain crackers. Low-sodium crackers. Vegetables Fresh or frozen vegetables (raw, steamed, roasted, or grilled). Low-sodium or reduced-sodium tomato and vegetable juices. Low-sodium or reduced-sodium tomato sauce and paste. Low-sodium or reduced-sodium canned vegetables.  Fruits All fresh, canned (in natural juice), or frozen fruits. Meat and Other Protein Products Ground beef (85% or leaner), grass-fed beef, or beef  trimmed of fat. Skinless chicken or [place]. Ground chicken or [place]. Pork trimmed of fat. All fish and seafood. Eggs. Dried beans, peas, or lentils. Unsalted nuts and seeds. Unsalted canned beans. Dairy Low-fat dairy products, such as skim or 1% milk, 2% or reduced-fat cheeses, low-fat ricotta or cottage cheese, or plain low-fat yogurt. Low-sodium or reduced-sodium cheeses. Fats and Oils Tub margarines without trans fats. Light or reduced-fat mayonnaise and salad dressings (reduced sodium). Avocado. Safflower, olive, or canola oils. Natural peanut or almond butter. Other Unsalted popcorn and pretzels. The items listed above may not be a complete list of recommended foods or beverages. Contact your dietitian for more options. WHAT FOODS ARE NOT RECOMMENDED? Grains White bread. White pasta. White rice. Refined cornbread. Bagels and croissants. Crackers that contain trans fat. Vegetables Creamed or fried vegetables. Vegetables in a cheese sauce. Regular canned vegetables. Regular canned tomato sauce and paste. Regular tomato and vegetable juices. Fruits Dried fruits. Canned fruit in light or heavy syrup. Fruit juice. Meat and Other Protein Products Fatty cuts of meat. Ribs, chicken wings, bacon, sausage, bologna, salami, chitterlings, fatback, hot dogs, bratwurst, and packaged luncheon meats. Salted nuts and seeds. Canned beans with salt. Dairy Whole or 2% milk, cream, half-and-half, and cream cheese. Whole-fat or sweetened yogurt. Full-fat cheeses or blue cheese. Nondairy creamers and whipped toppings. Processed cheese, cheese spreads, or cheese curds. Condiments Onion and garlic salt, seasoned salt, table salt, and sea salt. Canned and packaged gravies. Worcestershire sauce. Tartar sauce. Barbecue sauce. Teriyaki sauce. Soy sauce, including reduced sodium. Steak sauce. Fish sauce. Oyster sauce. Cocktail sauce. Horseradish. Ketchup and mustard. Meat flavorings and tenderizers. Bouillon cubes. Hot  sauce. Tabasco sauce. Marinades. Taco seasonings. Relishes. Fats and Oils Butter, stick margarine, lard, shortening, ghee, and bacon fat. Coconut, palm kernel, or palm oils. Regular salad dressings. Other Pickles and olives. Salted popcorn and pretzels. The items listed above may not be a complete list of foods and beverages to avoid. Contact your dietitian for more information. WHERE CAN I FIND MORE INFORMATION? [employer], [employer], [employer]: travelstabl[employer] Document Released: 06/05/2011 Document Revised: 10/31/2013 Document Reviewed: 04/20/2013 ExitCare Patient Information 2015 [place]. This information is not intended to replace advice given to you by your health care provider. Make sure you discuss any questions you have with your health care provider.  

## 2014-08-24 ENCOUNTER — Telehealth: Payer: Self-pay

## 2014-08-24 MED ORDER — ATORVASTATIN CALCIUM 20 MG PO TABS: 20.0000 mg | ORAL_TABLET | Freq: Every day | ORAL | Status: DC

## 2014-08-24 NOTE — Telephone Encounter (Signed)
Patient not available Left message on voice mail to return our call 

## 2014-08-24 NOTE — Telephone Encounter (Signed)
-----   Message from Lorayne Marek, MD sent at 08/14/2014 12:24 PM EST ----- noticed hyperlipidemia, since patient is diabetic will start on statins, advise patient for low fat diet and  start taking Lipitor 20 mg daily.will repeat fasting lipid panel on the next visit

## 2014-12-25 ENCOUNTER — Other Ambulatory Visit: Payer: Self-pay

## 2014-12-26 ENCOUNTER — Ambulatory Visit

## 2015-01-05 IMAGING — CT CT ABD-PELV W/ CM
1 of 3 series · 14 of 32 positions shown, 19 images · IV contrast (OMNIPAQUE 300)
Comparison: None.

CLINICAL DATA: Lower abdominal pain, nausea and vomiting. Elevated
white blood cell count.

EXAM:
CT ABDOMEN AND PELVIS WITH CONTRAST
TECHNIQUE: Multidetector CT imaging of the abdomen and pelvis was performed
using the standard protocol following bolus administration of
intravenous contrast.
CONTRAST:  50 mL OMNIPAQUE IOHEXOL 300 MG/ML SOLN, 100mL OMNIPAQUE
IOHEXOL 300 MG/ML SOLN

[Series 2: abd/pel with · axial · 0.84mm/px · z∈[+1104,+1538]mm · 14 of 99 slices shown, 19 images]
[im 6/99  soft-tissue]
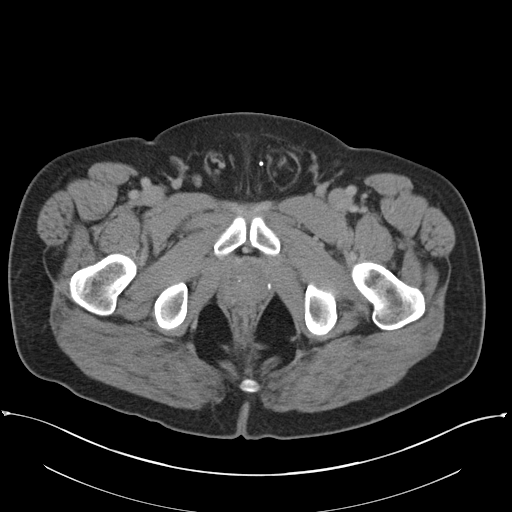
[im 6/99  bone]
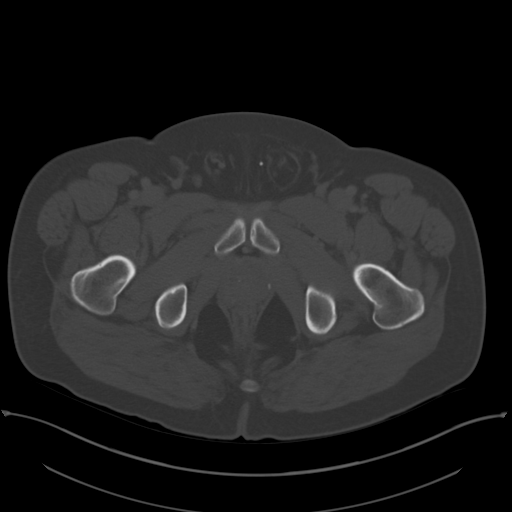
[im 11/99  soft-tissue]
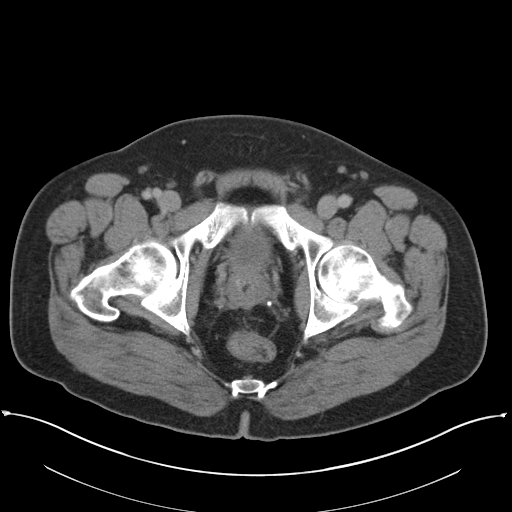
[im 22/99  soft-tissue]
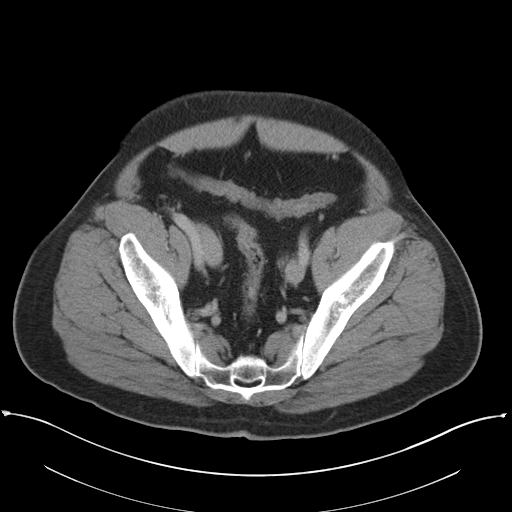
[im 28/99  soft-tissue]
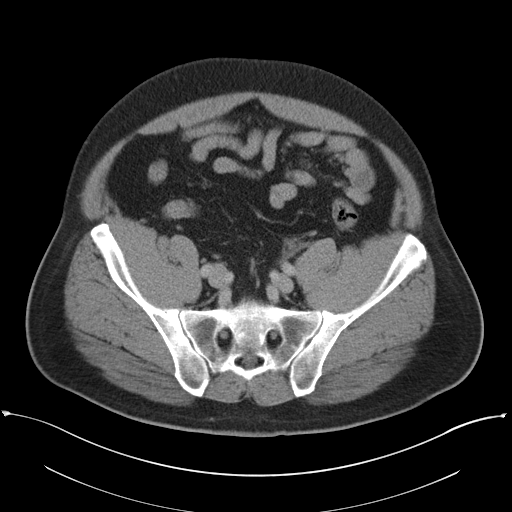
[im 33/99  soft-tissue]
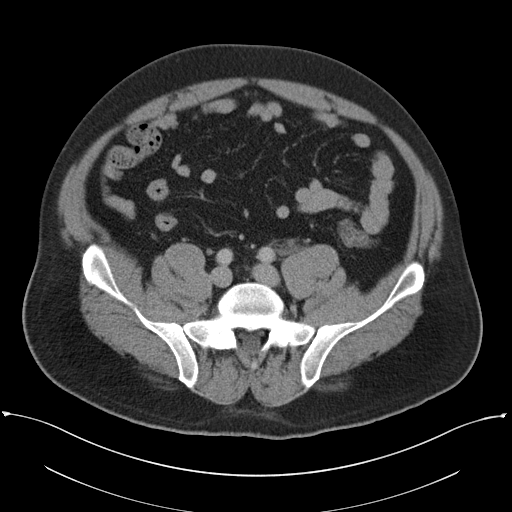
[im 44/99  soft-tissue]
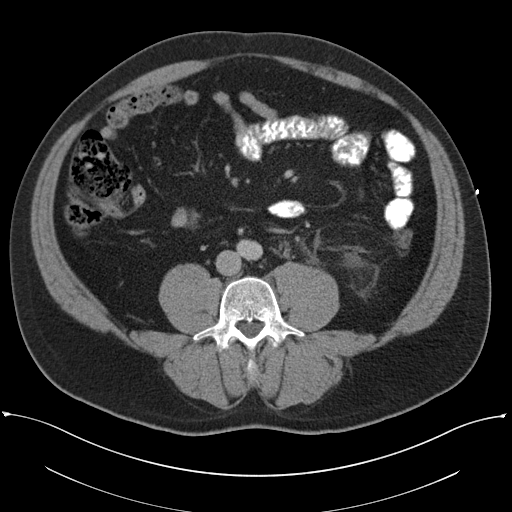
[im 50/99  soft-tissue]
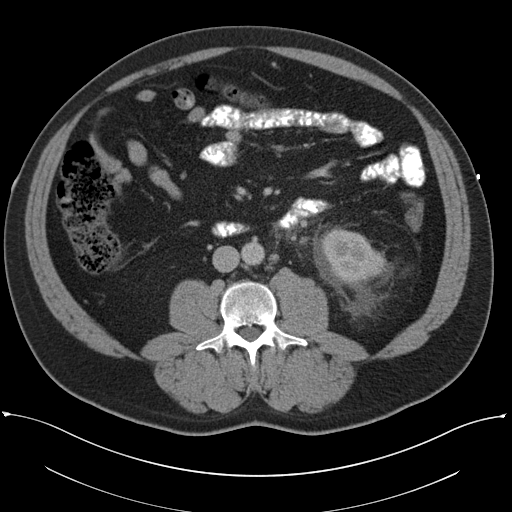
[im 55/99  soft-tissue]
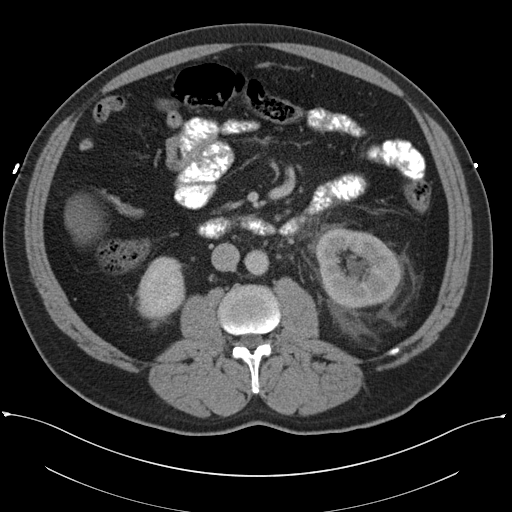
[im 66/99  soft-tissue]
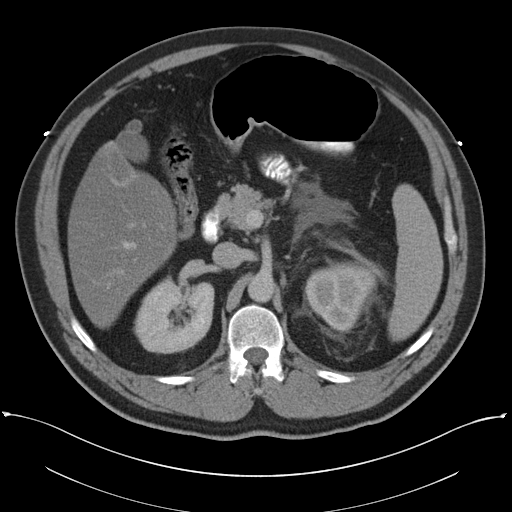
[im 66/99  bone]
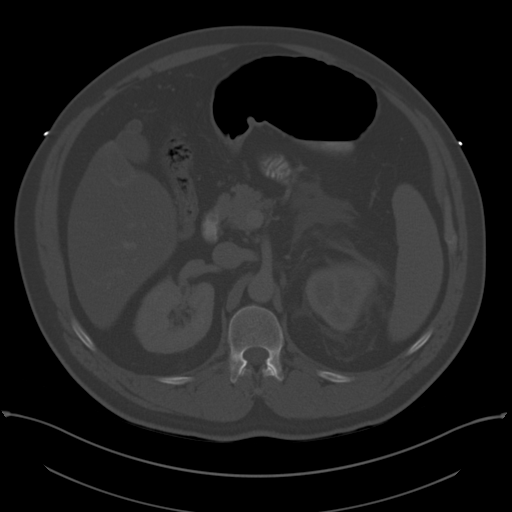
[im 71/99  soft-tissue]
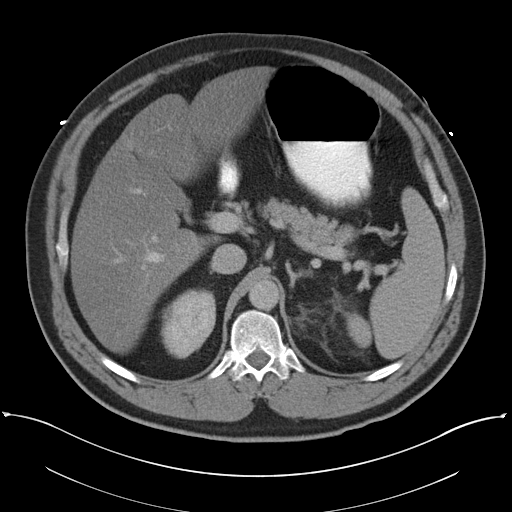
[im 77/99  soft-tissue]
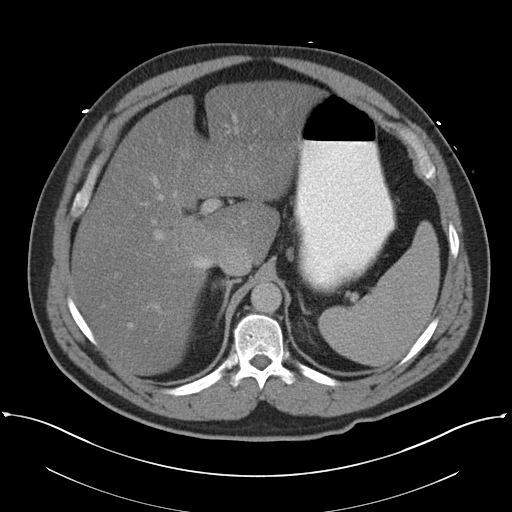
[im 77/99  lung]
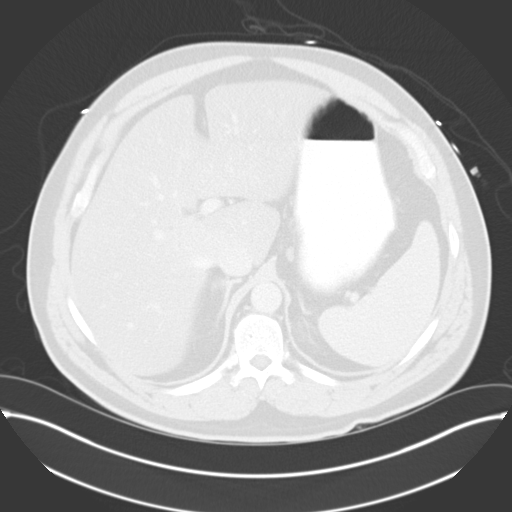
[im 82/99  lung]
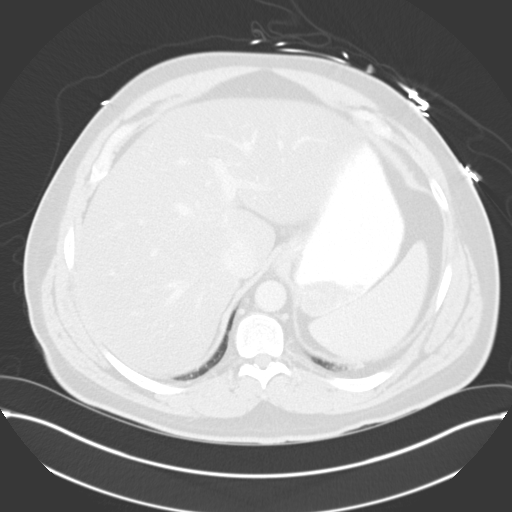
[im 88/99  soft-tissue]
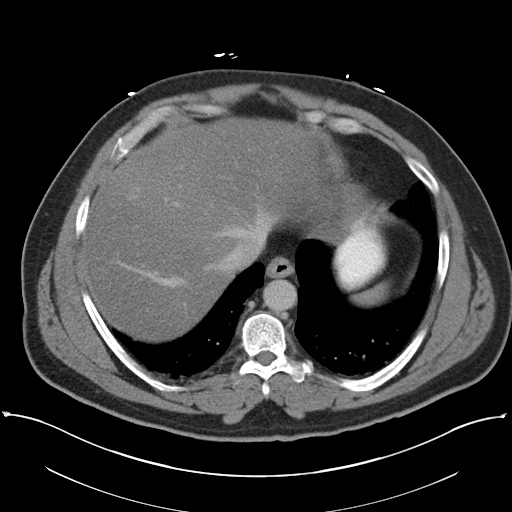
[im 88/99  lung]
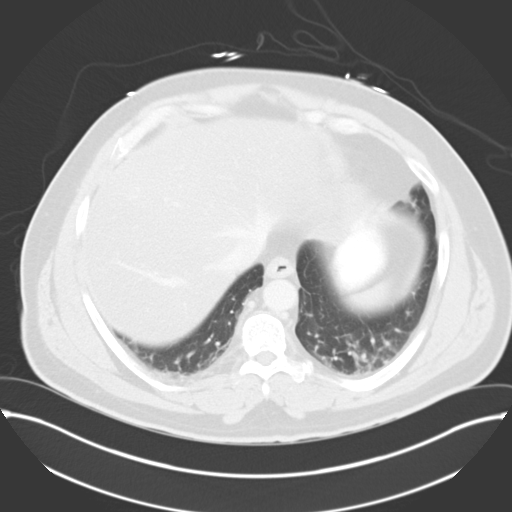
[im 93/99  soft-tissue]
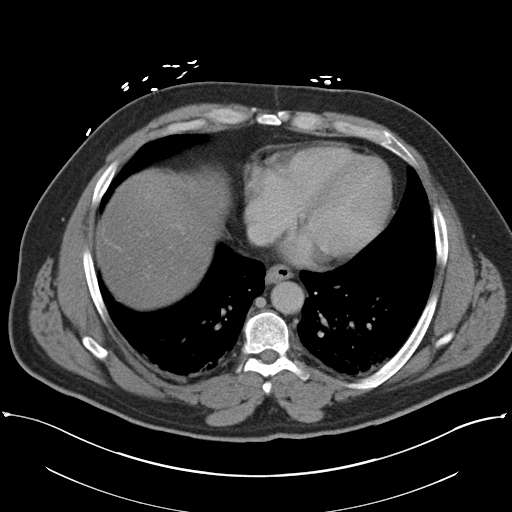
[im 93/99  lung]
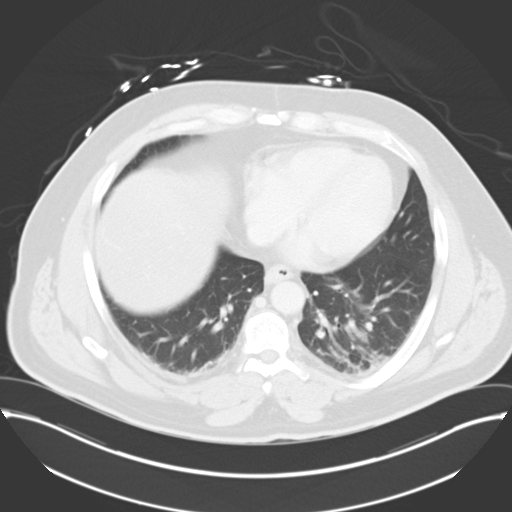

[14 of 32 positions shown; findings below may reference images not displayed]

FINDINGS: Mild dependent basilar atelectasis is noted. No pleural or
pericardial effusion.

There is extensive stranding about the left kidney and ureter and
mild to moderate left hydronephrosis due to a 0.3 cm stone at the
left UVJ. Associated delayed contrast excretion on the left is
identified. The right kidney appears normal.

There is diffuse fatty infiltration of the liver without focal
lesion. The spleen, adrenal glands, pancreas, gallbladder and
biliary tree appear normal. Prostate calcifications are noted. Small
fat containing inguinal hernias are seen, larger on the left. The
stomach, small and large bowel and appendix appear normal. There is
no lymphadenopathy or fluid. No focal bony abnormality is
identified.
IMPRESSION: Mild to moderate left hydronephrosis due to a 0.3 cm stone at the
left UVJ.

Diffuse fatty infiltration of the liver.

## 2015-01-10 ENCOUNTER — Ambulatory Visit: Admitting: Internal Medicine

## 2015-01-15 ENCOUNTER — Ambulatory Visit: Attending: Internal Medicine | Admitting: Internal Medicine

## 2015-01-15 ENCOUNTER — Encounter: Payer: Self-pay | Admitting: Internal Medicine

## 2015-01-15 VITALS — BP 123/81 | HR 71 | Temp 98.0°F | Resp 16 | Wt 222.6 lb

## 2015-01-15 DIAGNOSIS — E785 Hyperlipidemia, unspecified: Secondary | ICD-10-CM | POA: Insufficient documentation

## 2015-01-15 DIAGNOSIS — E119 Type 2 diabetes mellitus without complications: Secondary | ICD-10-CM | POA: Insufficient documentation

## 2015-01-15 DIAGNOSIS — G4730 Sleep apnea, unspecified: Secondary | ICD-10-CM | POA: Insufficient documentation

## 2015-01-15 DIAGNOSIS — E139 Other specified diabetes mellitus without complications: Secondary | ICD-10-CM

## 2015-01-15 DIAGNOSIS — Z79899 Other long term (current) drug therapy: Secondary | ICD-10-CM | POA: Insufficient documentation

## 2015-01-15 LAB — COMPLETE METABOLIC PANEL WITH GFR
ALBUMIN: 4.3 g/dL (ref 3.5–5.2)
ALT: 37 U/L (ref 0–53)
AST: 19 U/L (ref 0–37)
Alkaline Phosphatase: 70 U/L (ref 39–117)
BUN: 13 mg/dL (ref 6–23)
CALCIUM: 9.6 mg/dL (ref 8.4–10.5)
CHLORIDE: 103 meq/L (ref 96–112)
CO2: 27 meq/L (ref 19–32)
CREATININE: 0.94 mg/dL (ref 0.50–1.35)
GFR, Est Non African American: >89 mL/min
Glucose, Bld: 295 mg/dL — ABNORMAL HIGH (ref 70–99)
POTASSIUM: 4.8 meq/L (ref 3.5–5.3)
Sodium: 139 mEq/L (ref 135–145)
Total Bilirubin: 0.8 mg/dL (ref 0.2–1.2)
Total Protein: 6.7 g/dL (ref 6.0–8.3)

## 2015-01-15 LAB — LIPID PANEL
CHOL/HDL RATIO: 4.1 ratio
Cholesterol: 126 mg/dL (ref 0–200)
HDL: 31 mg/dL — ABNORMAL LOW (ref >=40)
LDL CALC: 67 mg/dL (ref 0–99)
TRIGLYCERIDES: 138 mg/dL (ref <150)
VLDL: 28 mg/dL (ref 0–40)

## 2015-01-15 LAB — GLUCOSE, POCT (MANUAL RESULT ENTRY): POC Glucose: 283 mg/dl — AB (ref 70–99)

## 2015-01-15 LAB — POCT GLYCOSYLATED HEMOGLOBIN (HGB A1C): HEMOGLOBIN A1C: 8

## 2015-01-15 MED ORDER — ATORVASTATIN CALCIUM 20 MG PO TABS: 20.0000 mg | ORAL_TABLET | Freq: Every day | ORAL | Status: DC

## 2015-01-15 MED ORDER — METFORMIN HCL 500 MG PO TABS: ORAL_TABLET | ORAL | Status: DC

## 2015-01-15 NOTE — Patient Instructions (Signed)
Fat and Cholesterol Control Diet Fat and cholesterol levels in your blood and organs are influenced by your diet. High levels of fat and cholesterol may lead to diseases of the heart, small and large blood vessels, gallbladder, liver, and pancreas. CONTROLLING FAT AND CHOLESTEROL WITH DIET Although exercise and lifestyle factors are important, your diet is key. That is because certain foods are known to raise cholesterol and others to lower it. The goal is to balance foods for their effect on cholesterol and more importantly, to replace saturated and trans fat with other types of fat, such as monounsaturated fat, polyunsaturated fat, and omega-3 fatty acids. On average, a person should consume no more than 15 to 17 g of saturated fat daily. Saturated and trans fats are considered "bad" fats, and they will raise LDL cholesterol. Saturated fats are primarily found in animal products such as meats, butter, and cream. However, that does not mean you need to give up all your favorite foods. Today, there are good tasting, low-fat, low-cholesterol substitutes for most of the things you like to eat. Choose low-fat or nonfat alternatives. Choose round or loin cuts of red meat. These types of cuts are lowest in fat and cholesterol. Chicken (without the skin), fish, veal, and ground turkey breast are great choices. Eliminate fatty meats, such as hot dogs and salami. Even shellfish have little or no saturated fat. Have a 3 oz (85 g) portion when you eat lean meat, poultry, or fish. Trans fats are also called "partially hydrogenated oils." They are oils that have been scientifically manipulated so that they are solid at room temperature resulting in a longer shelf life and improved taste and texture of foods in which they are added. Trans fats are found in stick margarine, some tub margarines, cookies, crackers, and baked goods.  When baking and cooking, oils are a great substitute for butter. The monounsaturated oils are  especially beneficial since it is believed they lower LDL and raise HDL. The oils you should avoid entirely are saturated tropical oils, such as coconut and palm.  Remember to eat a lot from food groups that are naturally free of saturated and trans fat, including fish, fruit, vegetables, beans, grains (barley, rice, couscous, bulgur wheat), and pasta (without cream sauces).  IDENTIFYING FOODS THAT LOWER FAT AND CHOLESTEROL  Soluble fiber may lower your cholesterol. This type of fiber is found in fruits such as apples, vegetables such as broccoli, potatoes, and carrots, legumes such as beans, peas, and lentils, and grains such as barley. Foods fortified with plant sterols (phytosterol) may also lower cholesterol. You should eat at least 2 g per day of these foods for a cholesterol lowering effect.  Read package labels to identify low-saturated fats, trans fat free, and low-fat foods at the supermarket. Select cheeses that have only 2 to 3 g saturated fat per ounce. Use a heart-healthy tub margarine that is free of trans fats or partially hydrogenated oil. When buying baked goods (cookies, crackers), avoid partially hydrogenated oils. Breads and muffins should be made from whole grains (whole-wheat or whole oat flour, instead of "flour" or "enriched flour"). Buy non-creamy canned soups with reduced salt and no added fats.  FOOD PREPARATION TECHNIQUES  Never deep-fry. If you must fry, either stir-fry, which uses very little fat, or use non-stick cooking sprays. When possible, broil, bake, or roast meats, and steam vegetables. Instead of putting butter or margarine on vegetables, use lemon and herbs, applesauce, and cinnamon (for squash and sweet potatoes). Use nonfat   yogurt, salsa, and low-fat dressings for salads.  LOW-SATURATED FAT / LOW-FAT FOOD SUBSTITUTES Meats / Saturated Fat (g)  Avoid: Steak, marbled (3 oz/85 g) / 11 g  Choose: Steak, lean (3 oz/85 g) / 4 g  Avoid: Hamburger (3 oz/85 g) / 7  g  Choose: Hamburger, lean (3 oz/85 g) / 5 g  Avoid: Ham (3 oz/85 g) / 6 g  Choose: Ham, lean cut (3 oz/85 g) / 2.4 g  Avoid: Chicken, with skin, dark meat (3 oz/85 g) / 4 g  Choose: Chicken, skin removed, dark meat (3 oz/85 g) / 2 g  Avoid: Chicken, with skin, light meat (3 oz/85 g) / 2.5 g  Choose: Chicken, skin removed, light meat (3 oz/85 g) / 1 g Dairy / Saturated Fat (g)  Avoid: Whole milk (1 cup) / 5 g  Choose: Low-fat milk, 2% (1 cup) / 3 g  Choose: Low-fat milk, 1% (1 cup) / 1.5 g  Choose: Skim milk (1 cup) / 0.3 g  Avoid: Hard cheese (1 oz/28 g) / 6 g  Choose: Skim milk cheese (1 oz/28 g) / 2 to 3 g  Avoid: Cottage cheese, 4% fat (1 cup) / 6.5 g  Choose: Low-fat cottage cheese, 1% fat (1 cup) / 1.5 g  Avoid: Ice cream (1 cup) / 9 g  Choose: Sherbet (1 cup) / 2.5 g  Choose: Nonfat frozen yogurt (1 cup) / 0.3 g  Choose: Frozen fruit bar / trace  Avoid: Whipped cream (1 tbs) / 3.5 g  Choose: Nondairy whipped topping (1 tbs) / 1 g Condiments / Saturated Fat (g)  Avoid: Mayonnaise (1 tbs) / 2 g  Choose: Low-fat mayonnaise (1 tbs) / 1 g  Avoid: Butter (1 tbs) / 7 g  Choose: Extra light margarine (1 tbs) / 1 g  Avoid: Coconut oil (1 tbs) / 11.8 g  Choose: Olive oil (1 tbs) / 1.8 g  Choose: Corn oil (1 tbs) / 1.7 g  Choose: Safflower oil (1 tbs) / 1.2 g  Choose: Sunflower oil (1 tbs) / 1.4 g  Choose: Soybean oil (1 tbs) / 2.4 g  Choose: Canola oil (1 tbs) / 1 g Document Released: 06/16/2005 Document Revised: 10/11/2012 Document Reviewed: 09/14/2013 ExitCare Patient Information 2015 [place], [place]. This information is not intended to replace advice given to you by your health care provider. Make sure you discuss any questions you have with your health care provider. Diabetes Mellitus and Food It is important for you to manage your blood sugar (glucose) level. Your blood glucose level can be greatly affected by what you eat. Eating healthier  foods in the appropriate amounts throughout the day at about the same time each day will help you control your blood glucose level. It can also help slow or prevent worsening of your diabetes mellitus. Healthy eating may even help you improve the level of your blood pressure and reach or maintain a healthy weight.  HOW CAN FOOD AFFECT ME? Carbohydrates Carbohydrates affect your blood glucose level more than any other type of food. Your dietitian will help you determine how many carbohydrates to eat at each meal and teach you how to count carbohydrates. Counting carbohydrates is important to keep your blood glucose at a healthy level, especially if you are using insulin or taking certain medicines for diabetes mellitus. Alcohol Alcohol can cause sudden decreases in blood glucose (hypoglycemia), especially if you use insulin or take certain medicines for diabetes mellitus. Hypoglycemia can be a life-threatening  condition. Symptoms of hypoglycemia (sleepiness, dizziness, and disorientation) are similar to symptoms of having too much alcohol.  If your health care provider has given you approval to drink alcohol, do so in moderation and use the following guidelines:  Women should not have more than one drink per day, and men should not have more than two drinks per day. One drink is equal to:  12 oz of beer.  5 oz of wine.  1 oz of hard liquor.  Do not drink on an empty stomach.  Keep yourself hydrated. Have water, diet soda, or unsweetened iced tea.  Regular soda, juice, and other mixers might contain a lot of carbohydrates and should be counted. WHAT FOODS ARE NOT RECOMMENDED? As you make food choices, it is important to remember that all foods are not the same. Some foods have fewer nutrients per serving than other foods, even though they might have the same number of calories or carbohydrates. It is difficult to get your body what it needs when you eat foods with fewer nutrients. Examples of  foods that you should avoid that are high in calories and carbohydrates but low in nutrients include:  Trans fats (most processed foods list trans fats on the Nutrition Facts label).  Regular soda.  Juice.  Candy.  Sweets, such as cake, pie, doughnuts, and cookies.  Fried foods. WHAT FOODS CAN I EAT? Have nutrient-rich foods, which will nourish your body and keep you healthy. The food you should eat also will depend on several factors, including:  The calories you need.  The medicines you take.  Your weight.  Your blood glucose level.  Your blood pressure level.  Your cholesterol level. You also should eat a variety of foods, including:  Protein, such as meat, poultry, fish, tofu, nuts, and seeds (lean animal proteins are best).  Fruits.  Vegetables.  Dairy products, such as milk, cheese, and yogurt (low fat is best).  Breads, grains, pasta, cereal, rice, and beans.  Fats such as olive oil, trans fat-free margarine, canola oil, avocado, and olives. DOES EVERYONE WITH DIABETES MELLITUS HAVE THE SAME MEAL PLAN? Because every person with diabetes mellitus is different, there is not one meal plan that works for everyone. It is very important that you meet with a dietitian who will help you create a meal plan that is just right for you. Document Released: 03/13/2005 Document Revised: 06/21/2013 Document Reviewed: 05/13/2013 Exit[employer] Patient Information 2015 [employer]. This information is not intended to replace advice given to you by your health care provider. Make sure you discuss any questions you have with your health care provider.  

## 2015-01-15 NOTE — Progress Notes (Signed)
Patient here for follow up on his diabetes and for routine blood work

## 2015-01-15 NOTE — Progress Notes (Signed)
MRN: [id number] Name: Paul [name]  Sex: male Age: 44 y.o. DOB: [birth year]  Allergies: Codeine  Chief Complaint  Patient presents with  . Follow-up    HPI: Patient is 44 y.o. male who has history of dyslipidemia, diabetes, comes today for followup as per patient he has been taking metformin 500 mg daily, also taking Lipitor 20 mg daily, his hemoglobin A1c has trended up, denies any hypoglycemic symptoms, patient has been trying to exercise and lose weight, currently denies any acute symptoms denies any headache dizziness chest and shortness of breath.  Past Medical History  Diagnosis Date  . Sleep apnea   . Diabetes mellitus without complication   . Hyperlipidemia   . Kidney stones     Past Surgical History  Procedure Laterality Date  . Umbilical hernia repair    . Knee arthroscopy Right       Medication List       This list is accurate as of: 01/15/15 10:40 AM.  Always use your most recent med list.               atorvastatin 20 MG tablet  Commonly known as:  LIPITOR  Take 1 tablet (20 mg total) by mouth daily.     metFORMIN 500 MG tablet  Commonly known as:  GLUCOPHAGE  TAKE 1 TABLET BY MOUTH DAILY WITH A MEAL.        Meds ordered this encounter  Medications  . atorvastatin (LIPITOR) 20 MG tablet    Sig: Take 1 tablet (20 mg total) by mouth daily.    Dispense:  30 tablet    Refill:  3  . metFORMIN (GLUCOPHAGE) 500 MG tablet    Sig: TAKE 1 TABLET BY MOUTH DAILY WITH A MEAL.    Dispense:  60 tablet    Refill:  3    Immunization History  Administered Date(s) Administered  . Influenza,inj,Quad PF,36+ Mos 06/04/2013, 08/11/2014    Family History  Problem Relation Age of Onset  . Hypertension Father   . Diabetes Mother   . Sleep apnea Other   . Heart attack Maternal Grandfather   . Heart attack Paternal Grandfather     History  Substance Use Topics  . Smoking status: Never Smoker   . Smokeless tobacco: Not on file  . Alcohol Use:  Yes     Comment: Social    Review of Systems   As noted in HPI  Filed Vitals:   01/15/15 0924  BP: 123/81  Pulse: 71  Temp: 98 F (36.7 C)  Resp: 16    Physical Exam  Physical Exam  Constitutional: No distress.  Eyes: EOM are normal. Pupils are equal, round, and reactive to light.  Cardiovascular: Normal rate and regular rhythm.   Pulmonary/Chest: Breath sounds normal. No respiratory distress. He has no wheezes. He has no rales.  Musculoskeletal: He exhibits no edema.    CBC    Component Value Date/Time   WBC 8.9 01/25/2014 1541   RBC 4.90 01/25/2014 1541   HGB 14.9 01/25/2014 1541   HCT 42.1 01/25/2014 1541   PLT 208 01/25/2014 1541   MCV 85.9 01/25/2014 1541   LYMPHSABS 2.3 01/25/2014 1541   MONOABS 0.6 01/25/2014 1541   EOSABS 0.5 01/25/2014 1541   BASOSABS 0.1 01/25/2014 1541    CMP     Component Value Date/Time   NA 140 08/11/2014 1044   K 4.6 08/11/2014 1044   CL 103 08/11/2014 1044   CO2 28 08/11/2014  1044   GLUCOSE 105* 08/11/2014 1044   BUN 14 08/11/2014 1044   CREATININE 0.86 08/11/2014 1044   CREATININE 1.05 05/24/2013 0623   CALCIUM 9.6 08/11/2014 1044   PROT 7.0 08/11/2014 1044   ALBUMIN 4.4 08/11/2014 1044   AST 16 08/11/2014 1044   ALT 25 08/11/2014 1044   ALKPHOS 55 08/11/2014 1044   BILITOT 1.1 08/11/2014 1044   GFRNONAA >89 08/11/2014 1044   GFRNONAA 85* 05/24/2013 0623   GFRAA >89 08/11/2014 1044   GFRAA >90 05/24/2013 0623    Lab Results  Component Value Date/Time   CHOL 214* 08/11/2014 10:44 AM    Lab Results  Component Value Date/Time   HGBA1C 8.0 01/15/2015 09:19 AM   HGBA1C 5.5 09/02/2013 09:09 AM    Lab Results  Component Value Date/Time   AST 16 08/11/2014 10:44 AM    Assessment and Plan  Other specified diabetes mellitus without complications - Plan: Results for orders placed or performed in visit on 01/15/15  Glucose (CBG)  Result Value Ref Range   POC Glucose 283.0 (A) 70 - 99 mg/dl  HgB A1c    Result Value Ref Range   Hemoglobin A1C 8.0    Hemoglobin A1c has trended up, I have advised patient for diabetes depending, increased the dose of metformin to 500 mg twice a day, recheck A1c in 3 months. metFORMIN (GLUCOPHAGE) 500 MG tablet, COMPLETE METABOLIC PANEL WITH GFR  Dyslipidemia - Plan: repeat Lipid panel, currently patient is on  atorvastatin (LIPITOR) 20 MG tablet, COMPLETE METABOLIC PANEL WITH GFR    Return in about 3 months (around 04/17/2015), or if symptoms worsen or fail to improve.   This note has been created with Surveyor, quantity. Any transcriptional errors are unintentional.    Lorayne Marek, MD

## 2015-01-16 ENCOUNTER — Telehealth: Payer: Self-pay

## 2015-01-16 NOTE — Telephone Encounter (Signed)
Patient not available Left message on voice mail to return our call 

## 2015-01-16 NOTE — Telephone Encounter (Signed)
-----   Message from Lorayne Marek, MD sent at 01/16/2015 11:36 AM EDT ----- Call and let the patient know that his cholesterol is improved , continue with low-fat diet and Lipitor.also let the patient know that his kidney function and liver function is in normal range.

## 2015-01-26 ENCOUNTER — Ambulatory Visit: Admitting: Internal Medicine

## 2015-01-31 ENCOUNTER — Telehealth: Payer: Self-pay

## 2015-01-31 ENCOUNTER — Telehealth: Payer: Self-pay | Admitting: Internal Medicine

## 2015-01-31 NOTE — Telephone Encounter (Signed)
Patient had called several times about his medications but the number he left not associated with his chart And patient left no date of birth i did get another message from patient with the info requested Tried to call patient back and it went right to voice mail Left message on machine to return our call

## 2015-01-31 NOTE — Telephone Encounter (Signed)
Patient called requesting to speak to nurse regarding medication , patient states he had poision ivy and was given medication, pt states medication has caused blood sugar to rise. Please f/u

## 2015-01-31 NOTE — Telephone Encounter (Signed)
Patient called requesting to speak to nurse regarding medication for Dm, Please f/u

## 2015-02-01 ENCOUNTER — Telehealth: Payer: Self-pay

## 2015-02-01 NOTE — Telephone Encounter (Signed)
Returned patient phone call Patient not available Left message on voice mail to return our call 

## 2015-04-03 ENCOUNTER — Encounter: Payer: Self-pay | Admitting: Family Medicine

## 2015-04-03 ENCOUNTER — Ambulatory Visit: Attending: Family Medicine | Admitting: Family Medicine

## 2015-04-03 VITALS — BP 142/90 | HR 72 | Temp 98.9°F | Resp 18 | Ht 70.0 in | Wt 206.0 lb

## 2015-04-03 DIAGNOSIS — E119 Type 2 diabetes mellitus without complications: Secondary | ICD-10-CM | POA: Diagnosis present

## 2015-04-03 DIAGNOSIS — Z7984 Long term (current) use of oral hypoglycemic drugs: Secondary | ICD-10-CM | POA: Insufficient documentation

## 2015-04-03 DIAGNOSIS — B351 Tinea unguium: Secondary | ICD-10-CM | POA: Diagnosis not present

## 2015-04-03 DIAGNOSIS — Z0000 Encounter for general adult medical examination without abnormal findings: Secondary | ICD-10-CM | POA: Diagnosis not present

## 2015-04-03 DIAGNOSIS — Z79899 Other long term (current) drug therapy: Secondary | ICD-10-CM | POA: Diagnosis not present

## 2015-04-03 DIAGNOSIS — R739 Hyperglycemia, unspecified: Secondary | ICD-10-CM

## 2015-04-03 DIAGNOSIS — E785 Hyperlipidemia, unspecified: Secondary | ICD-10-CM | POA: Diagnosis not present

## 2015-04-03 DIAGNOSIS — IMO0001 Reserved for inherently not codable concepts without codable children: Secondary | ICD-10-CM

## 2015-04-03 DIAGNOSIS — E1165 Type 2 diabetes mellitus with hyperglycemia: Secondary | ICD-10-CM

## 2015-04-03 DIAGNOSIS — G4733 Obstructive sleep apnea (adult) (pediatric): Secondary | ICD-10-CM | POA: Diagnosis not present

## 2015-04-03 LAB — POCT URINALYSIS DIPSTICK
Bilirubin, UA: NEGATIVE
Blood, UA: NEGATIVE
Glucose, UA: 500
Ketones, UA: NEGATIVE
LEUKOCYTES UA: NEGATIVE
Nitrite, UA: NEGATIVE
PROTEIN UA: NEGATIVE
Spec Grav, UA: 1.015
UROBILINOGEN UA: 0.2
pH, UA: 5

## 2015-04-03 LAB — GLUCOSE, POCT (MANUAL RESULT ENTRY)
POC GLUCOSE: 371 mg/dL — AB (ref 70–99)
POC Glucose: 452 mg/dl — AB (ref 70–99)

## 2015-04-03 LAB — POCT GLYCOSYLATED HEMOGLOBIN (HGB A1C): Hemoglobin A1C: 10.7

## 2015-04-03 MED ORDER — GLUCOSE BLOOD VI STRP: 1.0000 | ORAL_STRIP | Freq: Two times a day (BID) | Status: DC

## 2015-04-03 MED ORDER — GLIPIZIDE 5 MG PO TABS: 5.0000 mg | ORAL_TABLET | Freq: Two times a day (BID) | ORAL | Status: DC

## 2015-04-03 MED ORDER — ACCU-CHEK SOFTCLIX LANCETS MISC: 1.0000 | Freq: Two times a day (BID) | Status: DC

## 2015-04-03 MED ORDER — ACCU-CHEK AVIVA PLUS W/DEVICE KIT: 1.0000 | PACK | Freq: Two times a day (BID) | Status: DC

## 2015-04-03 MED ORDER — INSULIN ASPART 100 UNIT/ML ~~LOC~~ SOLN
20.0000 [IU] | Freq: Once | SUBCUTANEOUS | Status: AC
  Administered 2015-04-03: 20 [IU] via SUBCUTANEOUS

## 2015-04-03 MED ORDER — METFORMIN HCL 1000 MG PO TABS: ORAL_TABLET | ORAL | Status: DC

## 2015-04-03 NOTE — Progress Notes (Signed)
Subjective:  [name], male    DOB: 1971/03/20  Age: 44 y.o. MRN: [id number]  CC: Diabetes   HPI Paul [name] presents for   1. CHRONIC DIABETES dx in 12/2012. Reports high sugars since being treated for poison ivy with 11 day course of prednisone in late July 2016. Initially had blurry vision, fatigue, polyuria. Doing well now.   Disease Monitoring  Blood Sugar Ranges: 200-300s  Polyuria: no   Visual problems: no   Medication Compliance: yes  Medication Side Effects  Hypoglycemia: no   Preventitive Health Care  Eye Exam: due   Foot Exam: done today   Diet pattern: low carb, no sugar sweetened drinks, pasta q 7-10 days   Exercise: yes   2. R great toenail fungus: for many years. Treated with oral med in past x one week. No pain. No bleeding. Would like treatment.   Social History  Substance Use Topics  . Smoking status: Never Smoker   . Smokeless tobacco: Not on file  . Alcohol Use: Yes     Comment: Social    Outpatient Prescriptions Prior to Visit  Medication Sig Dispense Refill  . atorvastatin (LIPITOR) 20 MG tablet Take 1 tablet (20 mg total) by mouth daily. 30 tablet 3  . metFORMIN (GLUCOPHAGE) 500 MG tablet TAKE 1 TABLET BY MOUTH DAILY WITH A MEAL. 60 tablet 3   No facility-administered medications prior to visit.    ROS Review of Systems  Constitutional: Negative for fever, chills, fatigue and unexpected weight change.  Eyes: Negative for visual disturbance.  Respiratory: Negative for cough and shortness of breath.   Cardiovascular: Negative for chest pain, palpitations and leg swelling.  Gastrointestinal: Negative for nausea, vomiting, abdominal pain, diarrhea, constipation and blood in stool.  Endocrine: Negative for polydipsia, polyphagia and polyuria.  Musculoskeletal: Negative for myalgias, back pain, arthralgias, gait problem and neck pain.  Skin: Negative for rash.  Allergic/Immunologic: Negative for immunocompromised  state.  Hematological: Negative for adenopathy. Does not bruise/bleed easily.  Psychiatric/Behavioral: Negative for suicidal ideas, sleep disturbance and dysphoric mood. The patient is not nervous/anxious.     Objective:  BP 142/90 mmHg  Pulse 72  Temp(Src) 98.9 F (37.2 C) (Oral)  Resp 18  Ht 5\' 10"  (1.778 m)  Wt 206 lb (93.441 kg)  BMI 29.56 kg/m2  SpO2 98%  BP/Weight 04/03/2015 01/15/2015 7/74/1287  Systolic BP 867 672 094  Diastolic BP 90 81 88  Wt. (Lbs) 206 222.6 229  BMI 29.56 31.48 32.38   Physical Exam  Constitutional: He appears well-developed and well-nourished. No distress.  HENT:  Head: Normocephalic and atraumatic.  Neck: Normal range of motion. Neck supple.  Cardiovascular: Normal rate, regular rhythm, normal heart sounds and intact distal pulses.   Pulmonary/Chest: Effort normal and breath sounds normal.  Musculoskeletal: He exhibits no edema.  Neurological: He is alert.  Skin: Skin is warm and dry. No rash noted. No erythema.  Thickened toenail R great toe   Psychiatric: He has a normal mood and affect.   Lab Results  Component Value Date   HGBA1C 8.0 01/15/2015   CBG 452  Lab Results  Component Value Date   HGBA1C 10.70 04/03/2015   UA: 500 glucose, negative ketones, neg protein   Treated with 20 U of novolog  Repeat CBG 371  Assessment & Plan:   Problem List Items Addressed This Visit    Diabetes type 2, uncontrolled ([place]) - Primary (Chronic)    A: diabetes uncontrolled  on low dose metformin with hyperglycemia today Med: compliant P: Maximize metformin at 1000 mg BID Add glipizide 5 mg BID  CMP and lipids check No protein on UA, urine microalbumin/cr check       Relevant Medications   insulin aspart (novoLOG) injection 20 Units (Completed)   metFORMIN (GLUCOPHAGE) 1000 MG tablet   glipiZIDE (GLUCOTROL) 5 MG tablet   Other Relevant Orders   HgB A1c (Completed)   Glucose (CBG) (Completed)   Microalbumin/Creatinine Ratio, Urine    POCT urinalysis dipstick (Completed)   Ambulatory referral to Ophthalmology   COMPLETE METABOLIC PANEL WITH GFR   Dyslipidemia (Chronic)   Relevant Orders   Lipid Panel   Obstructive sleep apnea (Chronic)   Onychomycosis of right great toe   Relevant Orders   COMPLETE METABOLIC PANEL WITH GFR    Other Visit Diagnoses    Hyperglycemia        Relevant Orders    POCT urinalysis dipstick (Completed)    Glucose (CBG) (Completed)    Health care maintenance        Relevant Orders    Flu Vaccine QUAD 36+ mos PF IM (Fluarix & Fluzone Quad PF) (Completed)       No orders of the defined types were placed in this encounter.    Follow-up: No Follow-up on file.   Boykin Nearing MD

## 2015-04-03 NOTE — Patient Instructions (Addendum)
Paul [name],  Thank you for coming in today. It was a pleasure meeting you. I look forward to being your primary doctor.  Check and write down sugars 1-2 times a day, varying the time  Diabetes blood sugar goals  Fasting (in AM before breakfast, 8 hrs of no eating or drinking (except water or unsweetened coffee or tea): 90-110 2 hrs after meals: < 160,   No low sugars: nothing < 70   Metformin 1000 mg BID Glipizide 5 mg twice daily with food   Check out this website for low carb meal ideas: http://www.ruled.me/  F/u in 1-3 days for fasting labs F/u with pharmacist in 3-4 weeks for blood sugar review F/u with me in 3 months for diabetes   Dr. Adrian Blackwater

## 2015-04-03 NOTE — Progress Notes (Signed)
Patient is Paul [name].  Patient last took Atorvastatin a month ago, has concerns about taking medication.  Patient had poison ivy and [facility] gave prednisone. Patient had this medication before DM diagnosis. After patient was diagnosed and received prednisone patient experienced frequent urinating, dry mouth, fatigue, blurred vision. Sugar level raised over 500.  Back pain after stopping Prednisone.  Patient denies any pain at this time

## 2015-04-03 NOTE — Assessment & Plan Note (Addendum)
A: diabetes uncontrolled on low dose metformin with hyperglycemia today Med: compliant P: Maximize metformin at 1000 mg BID Add glipizide 5 mg BID  CMP and lipids check No protein on UA, urine microalbumin/cr check

## 2015-04-03 NOTE — Assessment & Plan Note (Signed)
Plan for lamisil after checking LFTs

## 2015-04-04 ENCOUNTER — Ambulatory Visit: Attending: Family Medicine

## 2015-04-04 DIAGNOSIS — IMO0001 Reserved for inherently not codable concepts without codable children: Secondary | ICD-10-CM

## 2015-04-04 DIAGNOSIS — E785 Hyperlipidemia, unspecified: Secondary | ICD-10-CM

## 2015-04-04 DIAGNOSIS — B351 Tinea unguium: Secondary | ICD-10-CM

## 2015-04-04 DIAGNOSIS — E1165 Type 2 diabetes mellitus with hyperglycemia: Principal | ICD-10-CM

## 2015-04-04 LAB — COMPLETE METABOLIC PANEL WITH GFR
ALT: 15 U/L (ref 9–46)
AST: 13 U/L (ref 10–40)
Albumin: 4.5 g/dL (ref 3.6–5.1)
Alkaline Phosphatase: 61 U/L (ref 40–115)
BUN: 15 mg/dL (ref 7–25)
CHLORIDE: 105 mmol/L (ref 98–110)
CO2: 26 mmol/L (ref 20–31)
Calcium: 9.2 mg/dL (ref 8.6–10.3)
Creat: 0.88 mg/dL (ref 0.60–1.35)
GLUCOSE: 299 mg/dL — AB (ref 65–99)
POTASSIUM: 4.8 mmol/L (ref 3.5–5.3)
SODIUM: 136 mmol/L (ref 135–146)
TOTAL PROTEIN: 6.1 g/dL (ref 6.1–8.1)
Total Bilirubin: 1.1 mg/dL (ref 0.2–1.2)

## 2015-04-04 LAB — LIPID PANEL
CHOL/HDL RATIO: 5.1 ratio — AB (ref <=5.0)
Cholesterol: 187 mg/dL (ref 125–200)
HDL: 37 mg/dL — AB (ref >=40)
LDL CALC: 132 mg/dL — AB (ref <130)
TRIGLYCERIDES: 88 mg/dL (ref <150)
VLDL: 18 mg/dL (ref <30)

## 2015-04-04 LAB — MICROALBUMIN / CREATININE URINE RATIO
CREATININE, URINE: 48.7 mg/dL
MICROALB UR: 0.3 mg/dL (ref <2.0)
MICROALB/CREAT RATIO: 6.2 mg/g (ref 0.0–30.0)

## 2015-04-06 ENCOUNTER — Other Ambulatory Visit: Payer: Self-pay | Admitting: Family Medicine

## 2015-04-06 DIAGNOSIS — E785 Hyperlipidemia, unspecified: Secondary | ICD-10-CM

## 2015-04-06 DIAGNOSIS — E1165 Type 2 diabetes mellitus with hyperglycemia: Principal | ICD-10-CM

## 2015-04-06 DIAGNOSIS — IMO0001 Reserved for inherently not codable concepts without codable children: Secondary | ICD-10-CM

## 2015-04-06 MED ORDER — ROSUVASTATIN CALCIUM 10 MG PO TABS
10.0000 mg | ORAL_TABLET | Freq: Every day | ORAL | Status: DC
Start: 2015-04-06 — End: 2015-04-09

## 2015-04-09 ENCOUNTER — Telehealth: Payer: Self-pay | Admitting: Family Medicine

## 2015-04-09 ENCOUNTER — Other Ambulatory Visit: Payer: Self-pay | Admitting: Family Medicine

## 2015-04-09 DIAGNOSIS — E785 Hyperlipidemia, unspecified: Secondary | ICD-10-CM

## 2015-04-09 DIAGNOSIS — E1165 Type 2 diabetes mellitus with hyperglycemia: Principal | ICD-10-CM

## 2015-04-09 DIAGNOSIS — IMO0001 Reserved for inherently not codable concepts without codable children: Secondary | ICD-10-CM

## 2015-04-09 MED ORDER — PRAVASTATIN SODIUM 40 MG PO TABS: 40.0000 mg | ORAL_TABLET | Freq: Every day | ORAL | Status: DC

## 2015-04-09 NOTE — Telephone Encounter (Signed)
Please call patient Insurance does not readily cover crestor Sent in pravastatin instead

## 2015-04-11 MED ORDER — METFORMIN HCL 1000 MG PO TABS: ORAL_TABLET | ORAL | Status: DC

## 2015-04-11 NOTE — Telephone Encounter (Signed)
-----   Message from Boykin Nearing, MD sent at 04/04/2015  9:13 AM EDT ----- Urine microalbumin normal Continue current care plan   Results sent to patient's mychart

## 2015-04-11 NOTE — Telephone Encounter (Signed)
Date of birth verified by pt Urine Microalbumin normal Advised to continue taking medication as prescribed  Pt stated taking Metformin BID daily per PCP

## 2015-11-07 ENCOUNTER — Telehealth: Payer: Self-pay | Admitting: Family Medicine

## 2015-11-07 DIAGNOSIS — E1165 Type 2 diabetes mellitus with hyperglycemia: Principal | ICD-10-CM

## 2015-11-07 DIAGNOSIS — IMO0001 Reserved for inherently not codable concepts without codable children: Secondary | ICD-10-CM

## 2015-11-07 MED ORDER — METFORMIN HCL 1000 MG PO TABS: ORAL_TABLET | ORAL | Status: DC

## 2015-11-07 NOTE — Telephone Encounter (Signed)
Rx send to [facility]  Need ov for future refills

## 2015-11-07 NOTE — Telephone Encounter (Signed)
Pt. Called requesting a refill on Metformin. Please f/u with pt.

## 2015-11-08 ENCOUNTER — Other Ambulatory Visit: Payer: Self-pay | Admitting: Family Medicine

## 2015-11-08 DIAGNOSIS — E1165 Type 2 diabetes mellitus with hyperglycemia: Principal | ICD-10-CM

## 2015-11-08 DIAGNOSIS — IMO0001 Reserved for inherently not codable concepts without codable children: Secondary | ICD-10-CM

## 2015-11-08 MED ORDER — METFORMIN HCL 1000 MG PO TABS
1000.0000 mg | ORAL_TABLET | Freq: Two times a day (BID) | ORAL | Status: DC
Start: 2015-11-08 — End: 2015-12-07

## 2015-12-07 ENCOUNTER — Ambulatory Visit: Attending: Family Medicine | Admitting: Family Medicine

## 2015-12-07 ENCOUNTER — Encounter: Payer: Self-pay | Admitting: Family Medicine

## 2015-12-07 VITALS — BP 102/68 | HR 93 | Temp 98.6°F | Resp 16 | Ht 70.5 in | Wt 196.0 lb

## 2015-12-07 DIAGNOSIS — E1165 Type 2 diabetes mellitus with hyperglycemia: Secondary | ICD-10-CM

## 2015-12-07 DIAGNOSIS — E119 Type 2 diabetes mellitus without complications: Secondary | ICD-10-CM | POA: Insufficient documentation

## 2015-12-07 DIAGNOSIS — IMO0001 Reserved for inherently not codable concepts without codable children: Secondary | ICD-10-CM

## 2015-12-07 DIAGNOSIS — Z0000 Encounter for general adult medical examination without abnormal findings: Secondary | ICD-10-CM

## 2015-12-07 DIAGNOSIS — Z114 Encounter for screening for human immunodeficiency virus [HIV]: Secondary | ICD-10-CM | POA: Diagnosis not present

## 2015-12-07 DIAGNOSIS — Z7984 Long term (current) use of oral hypoglycemic drugs: Secondary | ICD-10-CM | POA: Diagnosis not present

## 2015-12-07 DIAGNOSIS — E785 Hyperlipidemia, unspecified: Secondary | ICD-10-CM

## 2015-12-07 DIAGNOSIS — B351 Tinea unguium: Secondary | ICD-10-CM

## 2015-12-07 DIAGNOSIS — Z79899 Other long term (current) drug therapy: Secondary | ICD-10-CM | POA: Insufficient documentation

## 2015-12-07 LAB — GLUCOSE, POCT (MANUAL RESULT ENTRY): POC GLUCOSE: 89 mg/dL (ref 70–99)

## 2015-12-07 LAB — POCT GLYCOSYLATED HEMOGLOBIN (HGB A1C): HEMOGLOBIN A1C: 6

## 2015-12-07 MED ORDER — TERBINAFINE HCL 250 MG PO TABS: 250.0000 mg | ORAL_TABLET | Freq: Every day | ORAL | Status: DC

## 2015-12-07 MED ORDER — METFORMIN HCL 1000 MG PO TABS: 1000.0000 mg | ORAL_TABLET | Freq: Two times a day (BID) | ORAL | Status: DC

## 2015-12-07 NOTE — Progress Notes (Signed)
F/U DM cholesterol Glucose running 120-130 not checking glucose daily  No tobacco user  No suicidal thoughts in the past two weeks  No pain today

## 2015-12-07 NOTE — Patient Instructions (Addendum)
Paul [name] was seen today for diabetes.  Diagnoses and all orders for this visit:  Uncontrolled type 2 diabetes mellitus without complication, without long-term current use of insulin (HCC) -     POCT glycosylated hemoglobin (Hb A1C) -     POCT glucose (manual entry) -     metFORMIN (GLUCOPHAGE) 1000 MG tablet; Take 1 tablet (1,000 mg total) by mouth 2 (two) times daily with a meal.  Dyslipidemia -     Lipid Panel; Future  Onychomycosis of right great toe -     Cancel: COMPLETE METABOLIC PANEL WITH GFR -     terbinafine (LAMISIL) 250 MG tablet; Take 1 tablet (250 mg total) by mouth daily. For 12 weeks -     COMPLETE METABOLIC PANEL WITH GFR; Future  Screening for HIV (human immunodeficiency virus) -     HIV antibody (with reflex); Future  Healthcare maintenance -     Ambulatory referral to Gastroenterology   Please drop a copy of your colonoscopy report and pathology results for the polyp biopsies so I can add it to your medical record   F/u at your earliest convenience for labs F/u in 6 weeks after starting lamisil for repeat liver function check  F/u with 6 months with me for diabetes, awesome job getting your sugar under control!  Dr. Adrian Blackwater

## 2015-12-07 NOTE — Progress Notes (Signed)
Subjective:  [name], male    DOB: [birth year]  Age: 45 y.o. MRN: [id number]  CC: Diabetes   HPI Paul [name] presents for   1. CHRONIC DIABETES dx in 2014.   Disease Monitoring  Blood Sugar Ranges: 120 -130   Polyuria: no   Visual problems: no   Medication Compliance: yes, with metformin only   Medication Side Effects  Hypoglycemia: no  He walks and does push up most days. He eats a low carb diet. He is working on weight loss.   2. HLD: not taking pravastatin 40 mg daily. Prefers to take lipitor if needed.    3. L foot sensations: since 03/2015. Abnormal sensations in L foot. Some numbness and some over sensitivity. No lesions. No weakness in L ankle or foot.   4. ? Repeat screening Colonoscopy: he reports his first colonoscopy was done about 3-4 years ago while in serving in the [employer] in [place]. It was abnormal with polyps. He cannot recall the exact interval for screening but believes it was 3-5 years. No blood in stool. No fam hx of colon cancer.   Social History  Substance Use Topics  . Smoking status: Never Smoker   . Smokeless tobacco: Not on file  . Alcohol Use: Yes     Comment: Social    Outpatient Prescriptions Prior to Visit  Medication Sig Dispense Refill  . metFORMIN (GLUCOPHAGE) 1000 MG tablet Take 1 tablet (1,000 mg total) by mouth 2 (two) times daily with a meal. OV needed for additional refills 60 tablet 0  . glipiZIDE (GLUCOTROL) 5 MG tablet Take 1 tablet (5 mg total) by mouth 2 (two) times daily before a meal. (Patient not taking: Reported on 12/07/2015) 60 tablet 1  . pravastatin (PRAVACHOL) 40 MG tablet Take 1 tablet (40 mg total) by mouth daily. (Patient not taking: Reported on 12/07/2015) 90 tablet 3   No facility-administered medications prior to visit.    ROS Review of Systems  Constitutional: Negative for fever, chills, fatigue and unexpected weight change.  Eyes: Negative for visual disturbance.  Respiratory: Negative  for cough and shortness of breath.   Cardiovascular: Negative for chest pain, palpitations and leg swelling.  Gastrointestinal: Negative for nausea, vomiting, abdominal pain, diarrhea, constipation and blood in stool.  Endocrine: Negative for polydipsia, polyphagia and polyuria.  Musculoskeletal: Negative for myalgias, back pain, arthralgias, gait problem and neck pain.  Skin: Negative for rash.  Allergic/Immunologic: Negative for immunocompromised state.  Hematological: Negative for adenopathy. Does not bruise/bleed easily.  Psychiatric/Behavioral: Negative for suicidal ideas, sleep disturbance and dysphoric mood. The patient is not nervous/anxious.     Objective:  BP 102/68 mmHg  Pulse 93  Temp(Src) 98.6 F (37 C) (Oral)  Resp 16  Ht 5' 10.5" (1.791 m)  Wt 196 lb (88.905 kg)  BMI 27.72 kg/m2  SpO2 98%  BP/Weight 12/07/2015 04/03/2015 123456  Systolic BP A999333 A999333 [id number]  Diastolic BP 68 90 81  Wt. (Lbs) 196 206 222.6  BMI 27.72 29.56 31.48   Physical Exam  Constitutional: He appears well-developed and well-nourished. No distress.  HENT:  Head: Normocephalic and atraumatic.  Neck: Normal range of motion. Neck supple.  Cardiovascular: Normal rate, regular rhythm, normal heart sounds and intact distal pulses.   Pulmonary/Chest: Effort normal and breath sounds normal.  Musculoskeletal: He exhibits no edema.  Neurological: He is alert.  Skin: Skin is warm and dry. No rash noted. No erythema.  Thickened toenail R great toe  Psychiatric: He has a normal mood and affect.    Lab Results  Component Value Date   HGBA1C 10.70 04/03/2015   Lab Results  Component Value Date   HGBA1C 6.0 12/07/2015    CBG 89  Assessment & Plan:   Paul [name] was seen today for diabetes.  Diagnoses and all orders for this visit:  Uncontrolled type 2 diabetes mellitus without complication, without long-term current use of insulin (HCC) -     POCT glycosylated hemoglobin (Hb A1C) -     POCT  glucose (manual entry) -     metFORMIN (GLUCOPHAGE) 1000 MG tablet; Take 1 tablet (1,000 mg total) by mouth 2 (two) times daily with a meal.  Dyslipidemia -     Lipid Panel; Future  Onychomycosis of right great toe -     Cancel: COMPLETE METABOLIC PANEL WITH GFR -     terbinafine (LAMISIL) 250 MG tablet; Take 1 tablet (250 mg total) by mouth daily. For 12 weeks -     COMPLETE METABOLIC PANEL WITH GFR; Future  Screening for HIV (human immunodeficiency virus) -     HIV antibody (with reflex); Future  Healthcare maintenance -     Ambulatory referral to Gastroenterology    No orders of the defined types were placed in this encounter.    Follow-up: No Follow-up on file.   Boykin Nearing MD

## 2015-12-11 ENCOUNTER — Ambulatory Visit: Attending: Family Medicine

## 2015-12-11 DIAGNOSIS — B351 Tinea unguium: Secondary | ICD-10-CM

## 2015-12-11 DIAGNOSIS — Z114 Encounter for screening for human immunodeficiency virus [HIV]: Secondary | ICD-10-CM

## 2015-12-11 DIAGNOSIS — E785 Hyperlipidemia, unspecified: Secondary | ICD-10-CM | POA: Diagnosis not present

## 2015-12-11 LAB — LIPID PANEL
CHOL/HDL RATIO: 3.9 ratio (ref <=5.0)
CHOLESTEROL: 176 mg/dL (ref 125–200)
HDL: 45 mg/dL (ref >=40)
LDL CALC: 113 mg/dL (ref <130)
Triglycerides: 92 mg/dL (ref <150)
VLDL: 18 mg/dL (ref <30)

## 2015-12-11 LAB — COMPLETE METABOLIC PANEL WITH GFR
ALT: 14 U/L (ref 9–46)
AST: 13 U/L (ref 10–40)
Albumin: 4.4 g/dL (ref 3.6–5.1)
Alkaline Phosphatase: 46 U/L (ref 40–115)
BUN: 16 mg/dL (ref 7–25)
CALCIUM: 9 mg/dL (ref 8.6–10.3)
CHLORIDE: 104 mmol/L (ref 98–110)
CO2: 24 mmol/L (ref 20–31)
Creat: 0.87 mg/dL (ref 0.60–1.35)
GFR, Est African American: >89 mL/min (ref >=60)
GFR, Est Non African American: >89 mL/min (ref >=60)
Glucose, Bld: 135 mg/dL — ABNORMAL HIGH (ref 65–99)
POTASSIUM: 4.3 mmol/L (ref 3.5–5.3)
SODIUM: 141 mmol/L (ref 135–146)
Total Bilirubin: 1 mg/dL (ref 0.2–1.2)
Total Protein: 6.3 g/dL (ref 6.1–8.1)

## 2015-12-12 LAB — HIV ANTIBODY (ROUTINE TESTING W REFLEX): HIV 1&2 Ab, 4th Generation: NONREACTIVE

## 2016-01-07 ENCOUNTER — Encounter: Payer: Self-pay | Admitting: Family Medicine

## 2016-08-12 ENCOUNTER — Ambulatory Visit: Attending: Family Medicine | Admitting: Family Medicine

## 2016-08-12 ENCOUNTER — Encounter: Payer: Self-pay | Admitting: Family Medicine

## 2016-08-12 VITALS — BP 135/82 | HR 71 | Temp 98.0°F | Ht 70.5 in | Wt 182.4 lb

## 2016-08-12 DIAGNOSIS — Z7984 Long term (current) use of oral hypoglycemic drugs: Secondary | ICD-10-CM | POA: Diagnosis not present

## 2016-08-12 DIAGNOSIS — E1169 Type 2 diabetes mellitus with other specified complication: Secondary | ICD-10-CM

## 2016-08-12 DIAGNOSIS — E785 Hyperlipidemia, unspecified: Secondary | ICD-10-CM

## 2016-08-12 DIAGNOSIS — N529 Male erectile dysfunction, unspecified: Secondary | ICD-10-CM | POA: Diagnosis not present

## 2016-08-12 DIAGNOSIS — E1165 Type 2 diabetes mellitus with hyperglycemia: Secondary | ICD-10-CM | POA: Diagnosis not present

## 2016-08-12 DIAGNOSIS — IMO0001 Reserved for inherently not codable concepts without codable children: Secondary | ICD-10-CM

## 2016-08-12 DIAGNOSIS — Z79899 Other long term (current) drug therapy: Secondary | ICD-10-CM | POA: Insufficient documentation

## 2016-08-12 DIAGNOSIS — K635 Polyp of colon: Secondary | ICD-10-CM | POA: Diagnosis not present

## 2016-08-12 LAB — POCT GLYCOSYLATED HEMOGLOBIN (HGB A1C): HEMOGLOBIN A1C: 12.6

## 2016-08-12 LAB — GLUCOSE, POCT (MANUAL RESULT ENTRY): POC Glucose: 323 mg/dl — AB (ref 70–99)

## 2016-08-12 MED ORDER — GLIMEPIRIDE 2 MG PO TABS: 2.0000 mg | ORAL_TABLET | Freq: Every day | ORAL | 3 refills | Status: DC

## 2016-08-12 MED ORDER — METFORMIN HCL 1000 MG PO TABS: 1000.0000 mg | ORAL_TABLET | Freq: Two times a day (BID) | ORAL | 3 refills | Status: DC

## 2016-08-12 NOTE — Progress Notes (Signed)
Subjective:  [name], male    DOB: 1971-02-05  Age: 46 y.o. MRN: [id number]  CC: Diabetes   HPI Paul [name] presents for   1. CHRONIC DIABETES dx in 2014.   Disease Monitoring  Blood Sugar Ranges: in 300s at home, he checks rarely.   Polyuria: no   Visual problems: no   Medication Compliance: yes metformin  Medication Side Effects  Hypoglycemia: no   Diet: ate higher carb and alcohol during the holiday's   Exercise: He walks 2.5 miles, 5 days a week.  He eats grilled chicken salad and no dressing for lunch, he eats oatmeal with cinnamon and 1/2 banana at breakfast. He has lost weight.   2. HLD: request. Lipid panel. Not taking statin. Exercising.   3. Labs: request PSA, lipid check, CMP   4. L foot sensations: since 03/2015. Abnormal sensations in L foot. Some numbness and sometimes over sensitivity. No lesions. No weakness in L ankle or foot.   5. ? Repeat screening Colonoscopy: he reports his first colonoscopy was done about 3-4 years ago while in serving in the [employer] in [place]. It was abnormal with polyps. He cannot recall the exact interval for screening but believes it was 3-5 years. No blood in stool. No fam hx of colon cancer.   6. Recent URI: taking amoxicillin. Feeling better. No fever. Still with slight congestion.   Social History  Substance Use Topics  . Smoking status: Never Smoker  . Smokeless tobacco: Never Used  . Alcohol use Yes     Comment: Social    Outpatient Medications Prior to Visit  Medication Sig Dispense Refill  . metFORMIN (GLUCOPHAGE) 1000 MG tablet Take 1 tablet (1,000 mg total) by mouth 2 (two) times daily with a meal. 180 tablet 3  . terbinafine (LAMISIL) 250 MG tablet Take 1 tablet (250 mg total) by mouth daily. For 12 weeks 30 tablet 2   No facility-administered medications prior to visit.     ROS Review of Systems  Constitutional: Negative for chills, fatigue, fever and unexpected weight change.  Eyes:  Negative for visual disturbance.  Respiratory: Negative for cough and shortness of breath.   Cardiovascular: Negative for chest pain, palpitations and leg swelling.  Gastrointestinal: Negative for abdominal pain, blood in stool, constipation, diarrhea, nausea and vomiting.  Endocrine: Negative for polydipsia, polyphagia and polyuria.  Musculoskeletal: Negative for arthralgias, back pain, gait problem, myalgias and neck pain.  Skin: Negative for rash.  Allergic/Immunologic: Negative for immunocompromised state.  Hematological: Negative for adenopathy. Does not bruise/bleed easily.  Psychiatric/Behavioral: Negative for dysphoric mood, sleep disturbance and suicidal ideas. The patient is not nervous/anxious.     Objective:  BP 135/82 (BP Location: Left Arm, Patient Position: Sitting, Cuff Size: Small)   Pulse 71   Temp 98 F (36.7 C) (Oral)   Ht 5' 10.5" (1.791 m)   Wt 182 lb 6.4 oz (82.7 kg)   SpO2 97%   BMI 25.80 kg/m   BP/Weight 08/12/2016 12/07/2015 123456  Systolic BP A999333 A999333 A999333  Diastolic BP 82 68 90  Wt. (Lbs) 182.4 196 206  BMI 25.8 27.72 29.56   Physical Exam  Constitutional: He appears well-developed and well-nourished. No distress.  HENT:  Head: Normocephalic and atraumatic.  Right Ear: Tympanic membrane, external ear and ear canal normal.  Left Ear: Tympanic membrane and external ear normal.  Nose: Nose normal.  Mouth/Throat: Oropharynx is clear and moist and mucous membranes are normal.  Neck: Normal  range of motion. Neck supple.  Cardiovascular: Normal rate, regular rhythm, normal heart sounds and intact distal pulses.   Pulmonary/Chest: Effort normal and breath sounds normal.  Musculoskeletal: He exhibits no edema.  Neurological: He is alert.  Skin: Skin is warm and dry. No rash noted. No erythema.  Thickened toenail R great toe   Psychiatric: He has a normal mood and affect.    Lab Results  Component Value Date   HGBA1C 12.6 08/12/2016   CBG  323 Assessment & Plan:   Breylin was seen today for diabetes.  Diagnoses and all orders for this visit:  Uncontrolled type 2 diabetes mellitus without complication, without long-term current use of insulin (HCC) -     POCT glucose (manual entry) -     POCT glycosylated hemoglobin (Hb A1C) -     COMPLETE METABOLIC PANEL WITH GFR -     Lipid Panel -     Microalbumin/Creatinine Ratio, Urine -     glimepiride (AMARYL) 2 MG tablet; Take 1 tablet (2 mg total) by mouth daily before breakfast. -     Ambulatory referral to Ophthalmology  Erectile dysfunction, unspecified erectile dysfunction type -     PSA  Polyp of colon, unspecified part of colon, unspecified type -     Ambulatory referral to Gastroenterology    No orders of the defined types were placed in this encounter.   Follow-up: Return in about 2 weeks (around 08/26/2016) for diabetes .   Boykin Nearing MD

## 2016-08-12 NOTE — Assessment & Plan Note (Signed)
Declined with rise in A1c, doubled since last visit Plan continue metformin Add Amaryl taper up as tolerated Monitor CBGs Advised patient that some insulin may be needed

## 2016-08-12 NOTE — Addendum Note (Signed)
Addended by: Boykin Nearing on: 08/12/2016 05:18 PM   Modules accepted: Orders

## 2016-08-12 NOTE — Patient Instructions (Addendum)
K[place] was seen today for diabetes.  Diagnoses and all orders for this visit:  Uncontrolled type 2 diabetes mellitus without complication, without long-term current use of insulin (HCC) -     POCT glucose (manual entry) -     POCT glycosylated hemoglobin (Hb A1C) -     COMPLETE METABOLIC PANEL WITH GFR -     Lipid Panel -     Microalbumin/Creatinine Ratio, Urine -     glimepiride (AMARYL) 2 MG tablet; Take 1 tablet (2 mg total) by mouth daily before breakfast. -     Ambulatory referral to Ophthalmology  Erectile dysfunction, unspecified erectile dysfunction type -     PSA  Polyp of colon, unspecified part of colon, unspecified type -     Ambulatory referral to Gastroenterology  check and write down blood sugars  Diabetes blood sugar goals  Fasting (in AM before breakfast, 8 hrs of no eating or drinking (except water or unsweetened coffee or tea): 90-130 2 hrs after meals: < 160,   No low sugars: nothing < 70   F/u in 2 weeks for blood sugar review and check with plan to increase Amaryl if tolerating  Dr. Adrian Blackwater

## 2016-08-13 LAB — COMPLETE METABOLIC PANEL WITH GFR
ALT: 19 U/L (ref 9–46)
AST: 14 U/L (ref 10–40)
Albumin: 4.3 g/dL (ref 3.6–5.1)
Alkaline Phosphatase: 68 U/L (ref 40–115)
BUN: 19 mg/dL (ref 7–25)
CHLORIDE: 99 mmol/L (ref 98–110)
CO2: 27 mmol/L (ref 20–31)
CREATININE: 0.84 mg/dL (ref 0.60–1.35)
Calcium: 9.3 mg/dL (ref 8.6–10.3)
GFR, Est Non African American: >89 mL/min (ref >=60)
Glucose, Bld: 300 mg/dL — ABNORMAL HIGH (ref 65–99)
POTASSIUM: 3.9 mmol/L (ref 3.5–5.3)
Sodium: 136 mmol/L (ref 135–146)
Total Bilirubin: 0.7 mg/dL (ref 0.2–1.2)
Total Protein: 6.5 g/dL (ref 6.1–8.1)

## 2016-08-13 LAB — MICROALBUMIN / CREATININE URINE RATIO
Creatinine, Urine: 61 mg/dL (ref 20–370)
MICROALB/CREAT RATIO: 5 ug/mg{creat} (ref <30)
Microalb, Ur: 0.3 mg/dL

## 2016-08-13 LAB — LIPID PANEL
CHOL/HDL RATIO: 4.9 ratio (ref <5.0)
Cholesterol: 182 mg/dL (ref <200)
HDL: 37 mg/dL — AB (ref >40)
LDL CALC: 122 mg/dL — AB (ref <100)
Triglycerides: 116 mg/dL (ref <150)
VLDL: 23 mg/dL (ref <30)

## 2016-08-13 LAB — PSA: PSA: 0.7 ng/mL (ref <=4.0)

## 2016-08-15 MED ORDER — PRAVASTATIN SODIUM 40 MG PO TABS: 40.0000 mg | ORAL_TABLET | Freq: Every day | ORAL | 3 refills | Status: DC

## 2016-08-15 NOTE — Assessment & Plan Note (Signed)
On the cholesterol panel, the LDL has gone up and the HDL has gone down. Your diabetes does but you at higher risk for heart disease, based on the cholesterol values, your age and diabetes status, moderate intensity statin therapy is recommended. I recommended 40 mg of pravastatin. This is a moderate dose and unlike Crestor and Lipitor, pravastatin has less risk of statin induced muscle aches called myalgias.  I have sent pravastatin to your pharmacy

## 2016-08-15 NOTE — Addendum Note (Signed)
Addended by: Boykin Nearing on: 08/15/2016 07:58 AM   Modules accepted: Orders

## 2016-08-26 ENCOUNTER — Encounter: Payer: Self-pay | Admitting: Family Medicine

## 2016-08-26 ENCOUNTER — Ambulatory Visit: Attending: Family Medicine | Admitting: Family Medicine

## 2016-08-26 VITALS — BP 137/70 | HR 74 | Temp 97.9°F | Ht 70.5 in | Wt 181.0 lb

## 2016-08-26 DIAGNOSIS — B351 Tinea unguium: Secondary | ICD-10-CM | POA: Diagnosis not present

## 2016-08-26 DIAGNOSIS — E1165 Type 2 diabetes mellitus with hyperglycemia: Secondary | ICD-10-CM

## 2016-08-26 DIAGNOSIS — Z7984 Long term (current) use of oral hypoglycemic drugs: Secondary | ICD-10-CM | POA: Insufficient documentation

## 2016-08-26 DIAGNOSIS — E119 Type 2 diabetes mellitus without complications: Secondary | ICD-10-CM | POA: Diagnosis present

## 2016-08-26 DIAGNOSIS — IMO0001 Reserved for inherently not codable concepts without codable children: Secondary | ICD-10-CM

## 2016-08-26 DIAGNOSIS — Z23 Encounter for immunization: Secondary | ICD-10-CM

## 2016-08-26 LAB — GLUCOSE, POCT (MANUAL RESULT ENTRY): POC GLUCOSE: 258 mg/dL — AB (ref 70–99)

## 2016-08-26 MED ORDER — TERBINAFINE HCL 250 MG PO TABS: 250.0000 mg | ORAL_TABLET | Freq: Every day | ORAL | 2 refills | Status: DC

## 2016-08-26 MED ORDER — GLIMEPIRIDE 2 MG PO TABS: 4.0000 mg | ORAL_TABLET | Freq: Every day | ORAL | 3 refills | Status: DC

## 2016-08-26 NOTE — Patient Instructions (Addendum)
Paul [name] was seen today for diabetes.  Diagnoses and all orders for this visit:  Uncontrolled type 2 diabetes mellitus without complication, without long-term current use of insulin (HCC) -     POCT glucose (manual entry) -     glimepiride (AMARYL) 2 MG tablet; Take 2 tablets (4 mg total) by mouth daily before breakfast.  Onychomycosis of right great toe -     terbinafine (LAMISIL) 250 MG tablet; Take 1 tablet (250 mg total) by mouth daily. For 12 weeks  Follow up in 2 weeks with Erline Levine for a CBG reading. Please follow up with me in 6 weeks for Diabetes.

## 2016-08-26 NOTE — Assessment & Plan Note (Signed)
A: uncontrolled with hyperglycemia P: Increase Amaryl to 4 mg daily Continue metformin 1000 mg BID

## 2016-08-26 NOTE — Progress Notes (Signed)
Subjective:  [name], male    DOB: 1970/11/03  Age: 46 y.o. MRN: [id number]  CC: Diabetes   HPI Paul [name] presents for   1. CHRONIC DIABETES dx in 2014.   Disease Monitoring  Blood Sugar Ranges:   Fasting: 223  Postprandial: 199-344    Polyuria: no   Visual problems: no   Medication Compliance: yes metformin 1000 mg BID and amaryl 2 mg daily  Medication Side Effects  Hypoglycemia: no   Diet: low carb   Exercise: He walks 2.5 miles, 5 days a week.  He eats grilled chicken salad and no dressing for lunch, he eats oatmeal with cinnamon and 1/2 banana at breakfast. He has lost weight.    Social History  Substance Use Topics  . Smoking status: Never Smoker  . Smokeless tobacco: Never Used  . Alcohol use Yes     Comment: Social    Outpatient Medications Prior to Visit  Medication Sig Dispense Refill  . glimepiride (AMARYL) 2 MG tablet Take 1 tablet (2 mg total) by mouth daily before breakfast. 30 tablet 3  . metFORMIN (GLUCOPHAGE) 1000 MG tablet Take 1 tablet (1,000 mg total) by mouth 2 (two) times daily with a meal. 180 tablet 3  . pravastatin (PRAVACHOL) 40 MG tablet Take 1 tablet (40 mg total) by mouth daily. (Patient not taking: Reported on 08/26/2016) 90 tablet 3  . terbinafine (LAMISIL) 250 MG tablet Take 1 tablet (250 mg total) by mouth daily. For 12 weeks (Patient not taking: Reported on 08/26/2016) 30 tablet 2   No facility-administered medications prior to visit.     ROS Review of Systems  Constitutional: Negative for chills, fatigue, fever and unexpected weight change.  Eyes: Negative for visual disturbance.  Respiratory: Negative for cough and shortness of breath.   Cardiovascular: Negative for chest pain, palpitations and leg swelling.  Gastrointestinal: Negative for abdominal pain, blood in stool, constipation, diarrhea, nausea and vomiting.  Endocrine: Negative for polydipsia, polyphagia and polyuria.  Musculoskeletal:  Negative for arthralgias, back pain, gait problem, myalgias and neck pain.  Skin: Negative for rash.  Allergic/Immunologic: Negative for immunocompromised state.  Hematological: Negative for adenopathy. Does not bruise/bleed easily.  Psychiatric/Behavioral: Negative for dysphoric mood, sleep disturbance and suicidal ideas. The patient is not nervous/anxious.     Objective:  BP 137/70 (BP Location: Left Arm, Patient Position: Sitting, Cuff Size: Small)   Pulse 74   Temp 97.9 F (36.6 C) (Oral)   Ht 5' 10.5" (1.791 m)   Wt 181 lb (82.1 kg)   SpO2 100%   BMI 25.60 kg/m   BP/Weight 08/26/2016 [id number]  Systolic BP 0000000 A999333 [id number]  Diastolic BP 70 82 68  Wt. (Lbs) 181 182.4 196  BMI 25.6 25.8 27.72   Physical Exam  Constitutional: He appears well-developed and well-nourished. No distress.  HENT:  Head: Normocephalic and atraumatic.  Right Ear: Tympanic membrane, external ear and ear canal normal.  Left Ear: Tympanic membrane and external ear normal.  Nose: Nose normal.  Mouth/Throat: Oropharynx is clear and moist and mucous membranes are normal.  Neck: Normal range of motion. Neck supple.  Cardiovascular: Normal rate, regular rhythm, normal heart sounds and intact distal pulses.   Pulmonary/Chest: Effort normal and breath sounds normal.  Musculoskeletal: He exhibits no edema.  Neurological: He is alert.  Skin: Skin is warm and dry. No rash noted. No erythema.  Thickened toenail R great toe   Psychiatric: He has a normal mood  and affect.    Lab Results  Component Value Date   HGBA1C 12.6 08/12/2016   CBG 258 Assessment & Plan:   [place] was seen today for diabetes.  Diagnoses and all orders for this visit:  Uncontrolled type 2 diabetes mellitus without complication, without long-term current use of insulin (HCC) -     POCT glucose (manual entry) -     glimepiride (AMARYL) 2 MG tablet; Take 2 tablets (4 mg total) by mouth daily before breakfast.  Onychomycosis  of right great toe -     terbinafine (LAMISIL) 250 MG tablet; Take 1 tablet (250 mg total) by mouth daily. For 12 weeks    No orders of the defined types were placed in this encounter.   Follow-up: Return in about 2 weeks (around 09/09/2016) for CBG review .   Boykin Nearing MD

## 2016-08-26 NOTE — Addendum Note (Signed)
Addended by: Trecia Rogers on: 08/26/2016 02:59 PM   Modules accepted: Orders

## 2016-09-09 ENCOUNTER — Ambulatory Visit: Attending: Family Medicine | Admitting: Pharmacist

## 2016-09-09 DIAGNOSIS — Z7984 Long term (current) use of oral hypoglycemic drugs: Secondary | ICD-10-CM | POA: Insufficient documentation

## 2016-09-09 DIAGNOSIS — E119 Type 2 diabetes mellitus without complications: Secondary | ICD-10-CM | POA: Diagnosis not present

## 2016-09-09 DIAGNOSIS — Z79899 Other long term (current) drug therapy: Secondary | ICD-10-CM

## 2016-09-09 MED ORDER — SITAGLIPTIN PHOSPHATE 100 MG PO TABS: 100.0000 mg | ORAL_TABLET | Freq: Every day | ORAL | 2 refills | Status: DC

## 2016-09-09 NOTE — Patient Instructions (Signed)
Thanks for coming to see me!  Pick up the Januvia 100 mg tablets.  Come back 2 weeks after you start the Januvia (there may be a few day delay if [facility] requires a prior authorization)   Carbohydrate Counting for Diabetes Mellitus, Adult Carbohydrate counting is a method for keeping track of how many carbohydrates you eat. Eating carbohydrates naturally increases the amount of sugar (glucose) in the blood. Counting how many carbohydrates you eat helps keep your blood glucose within normal limits, which helps you manage your diabetes (diabetes mellitus). It is important to know how many carbohydrates you can safely have in each meal. This is different for every person. A diet and nutrition specialist (registered dietitian) can help you make a meal plan and calculate how many carbohydrates you should have at each meal and snack. Carbohydrates are found in the following foods:  Grains, such as breads and cereals.  Dried beans and soy products.  Starchy vegetables, such as potatoes, peas, and corn.  Fruit and fruit juices.  Milk and yogurt.  Sweets and snack foods, such as cake, cookies, candy, chips, and soft drinks. How do I count carbohydrates? There are two ways to count carbohydrates in food. You can use either of the methods or a combination of both. Reading "Nutrition Facts" on packaged food  The "Nutrition Facts" list is included on the labels of almost all packaged foods and beverages in the [place] It includes:  The serving size.  Information about nutrients in each serving, including the grams (g) of carbohydrate per serving. To use the "Nutrition Facts":  Decide how many servings you will have.  Multiply the number of servings by the number of carbohydrates per serving.  The resulting number is the total amount of carbohydrates that you will be having. Learning standard serving sizes of other foods  When you eat foods containing carbohydrates that are not packaged or do  not include "Nutrition Facts" on the label, you need to measure the servings in order to count the amount of carbohydrates:  Measure the foods that you will eat with a food scale or measuring cup, if needed.  Decide how many standard-size servings you will eat.  Multiply the number of servings by 15. Most carbohydrate-rich foods have about 15 g of carbohydrates per serving.  For example, if you eat 8 oz (170 g) of strawberries, you will have eaten 2 servings and 30 g of carbohydrates (2 servings x 15 g = 30 g).  For foods that have more than one food mixed, such as soups and casseroles, you must count the carbohydrates in each food that is included. The following list contains standard serving sizes of common carbohydrate-rich foods. Each of these servings has about 15 g of carbohydrates:   hamburger bun or  English muffin.   oz (15 mL) syrup.   oz (14 g) jelly.  1 slice of bread.  1 six-inch tortilla.  3 oz (85 g) cooked rice or pasta.  4 oz (113 g) cooked dried beans.  4 oz (113 g) starchy vegetable, such as peas, corn, or potatoes.  4 oz (113 g) hot cereal.  4 oz (113 g) mashed potatoes or  of a large baked potato.  4 oz (113 g) canned or frozen fruit.  4 oz (120 mL) fruit juice.  4-6 crackers.  6 chicken nuggets.  6 oz (170 g) unsweetened dry cereal.  6 oz (170 g) plain fat-free yogurt or yogurt sweetened with artificial sweeteners.  8 oz (  240 mL) milk.  8 oz (170 g) fresh fruit or one small piece of fruit.  24 oz (680 g) popped popcorn. Example of carbohydrate counting Sample meal   3 oz (85 g) chicken breast.  6 oz (170 g) brown rice.  4 oz (113 g) corn.  8 oz (240 mL) milk.  8 oz (170 g) strawberries with sugar-free whipped topping. Carbohydrate calculation  1. Identify the foods that contain carbohydrates:  Rice.  Corn.  Milk.  Strawberries. 2. Calculate how many servings you have of each food:  2 servings rice.  1 serving  corn.  1 serving milk.  1 serving strawberries. 3. Multiply each number of servings by 15 g:  2 servings rice x 15 g = 30 g.  1 serving corn x 15 g = 15 g.  1 serving milk x 15 g = 15 g.  1 serving strawberries x 15 g = 15 g. 4. Add together all of the amounts to find the total grams of carbohydrates eaten:  30 g + 15 g + 15 g + 15 g = 75 g of carbohydrates total. This information is not intended to replace advice given to you by your health care provider. Make sure you discuss any questions you have with your health care provider. Document Released: 06/16/2005 Document Revised: 01/04/2016 Document Reviewed: 11/28/2015 Elsevier Interactive Patient Education  2017 [employer].

## 2016-09-09 NOTE — Progress Notes (Signed)
    S:     No chief complaint on file.   Patient arrives in good spirits.  Presents for diabetes evaluation, education, and management at the request of Dr. Adrian Blackwater. Patient was referred on 08/26/16.  Patient was last seen by Primary Care Provider on 08/26/16.   Patient reports adherence with medications.  Current diabetes medications include: Metformin 1000 mg BID and glimepiride 4 mg daily.  Patient denies hypoglycemic events.  Patient reported dietary habits: Eats 3 meals/day Breakfast: oatmeal with no added sugar Lunch:salads from [employer] with no dressing but will have 2 pieces of toast with it. Dinner: meats and vegetables.  Drinks: water and diet soda  Patient reported exercise habits: walks   Patient denies nocturia.  Patient denies neuropathy. Patient denies visual changes. Patient reports self foot exams.   O:  Physical Exam   ROS   Lab Results  Component Value Date   HGBA1C 12.6 08/12/2016   There were no vitals filed for this visit.  Home fasting CBG: 181-312 2 hour post-prandial/random CBG: 157-300s   A/P: Diabetes longstanding currently UNcontrolled based on A1c of 12.6 and home CBGs. Patient denies hypoglycemic events and is able to verbalize appropriate hypoglycemia management plan. Patient reports adherence with medication. Control is suboptimal due to some dietary indiscretion and lack of more vigorous physical activity.  Continue current medications as prescribed. Patient has [facility] and would like to avoid insulin if he can but understands that he will likely need it in the future. Start Januvia 100 mg daily (covered by [facility]) - reviewed the medication with him including how to take, how it works, and possible adverse effects. Vania Rea is an option to consider in the future if he needs additional medication but still unwilling to start insulin.   Next A1C anticipated May 2018.    Written patient instructions provided.  Total time in face to face  counseling 30 minutes.   Follow up in Pharmacist Clinic Visit in 2 weeks.

## 2016-09-15 ENCOUNTER — Encounter: Payer: Self-pay | Admitting: Family Medicine

## 2016-09-23 ENCOUNTER — Ambulatory Visit: Attending: Family Medicine | Admitting: Pharmacist

## 2016-09-23 DIAGNOSIS — Z79899 Other long term (current) drug therapy: Secondary | ICD-10-CM | POA: Insufficient documentation

## 2016-09-23 NOTE — Patient Instructions (Addendum)
Thanks for coming to see [place]!  Restart glimepiride for now.   Follow up with Dr. Adrian Blackwater in April.

## 2016-09-23 NOTE — Progress Notes (Signed)
    S:     Chief Complaint  Patient presents with  . Medication Management    Patient arrives in good spirits.  Presents for diabetes evaluation, education, and management at the request of Dr. Adrian Blackwater. Patient was referred on 08/26/16.  Patient was last seen by Primary Care Provider on 08/26/16.   Patient reports adherence with medications.  Current diabetes medications include: Metformin 1000 mg BID and Januvia 100 mg daily. He stopped the glimepiride because he thought the Januvia was replacing it.  Patient denies hypoglycemic events.  Patient reported dietary habits: working on cutting out carbs. Will have pasta occasionally but much less than he used to.   Patient reported exercise habits: walks   Patient denies nocturia.  Patient denies neuropathy. Patient denies visual changes. Patient reports self foot exams.   O:  Physical Exam   ROS   Lab Results  Component Value Date   HGBA1C 12.6 08/12/2016   There were no vitals filed for this visit.  Home fasting CBG: 150s-250s 2 hour post-prandial/random CBG: 140s-210s   A/P: Diabetes longstanding currently UNcontrolled based on A1c of 12.6 and home CBGs. Patient denies hypoglycemic events and is able to verbalize appropriate hypoglycemia management plan. Patient reports adherence with medication. Control is suboptimal due to some dietary indiscretion and lack of more vigorous physical activity.  Continue current medications as prescribed which includes restarting the glimepiride 4 mg daily. The Januvia helped his blood glucose to be lower than his last visit so adding the glimepiride back will likely get him to goal or very close to it.  Encouraged him to keep eating low carb foods and exercising and this will help with lowering his blood sugars as well. Patient to call clinic if he experiences any hypoglycemia or to go to emergency room or call 911 if hypoglycemia is severe. Patient verbalized understanding.   Next A1C  anticipated May 2018.    Written patient instructions provided.  Total time in face to face counseling 30 minutes.   Follow up with Dr. Adrian Blackwater in 2 weeks (already scheduled).

## 2016-10-07 ENCOUNTER — Ambulatory Visit: Attending: Family Medicine | Admitting: Family Medicine

## 2016-10-07 ENCOUNTER — Encounter: Payer: Self-pay | Admitting: Family Medicine

## 2016-10-07 VITALS — BP 118/75 | HR 71 | Temp 98.2°F | Ht 70.5 in | Wt 181.8 lb

## 2016-10-07 DIAGNOSIS — Z7984 Long term (current) use of oral hypoglycemic drugs: Secondary | ICD-10-CM | POA: Diagnosis not present

## 2016-10-07 DIAGNOSIS — E1165 Type 2 diabetes mellitus with hyperglycemia: Secondary | ICD-10-CM | POA: Insufficient documentation

## 2016-10-07 DIAGNOSIS — J0110 Acute frontal sinusitis, unspecified: Secondary | ICD-10-CM | POA: Insufficient documentation

## 2016-10-07 DIAGNOSIS — IMO0001 Reserved for inherently not codable concepts without codable children: Secondary | ICD-10-CM

## 2016-10-07 LAB — GLUCOSE, POCT (MANUAL RESULT ENTRY): POC Glucose: 144 mg/dl — AB (ref 70–99)

## 2016-10-07 LAB — POCT GLYCOSYLATED HEMOGLOBIN (HGB A1C): Hemoglobin A1C: 9

## 2016-10-07 MED ORDER — SITAGLIPTIN PHOSPHATE 100 MG PO TABS: 100.0000 mg | ORAL_TABLET | Freq: Every day | ORAL | 3 refills | Status: DC

## 2016-10-07 MED ORDER — GLIMEPIRIDE 4 MG PO TABS: 8.0000 mg | ORAL_TABLET | Freq: Every day | ORAL | 5 refills | Status: DC

## 2016-10-07 NOTE — Patient Instructions (Addendum)
Paul [name] was seen today for diabetes.  Diagnoses and all orders for this visit:  Uncontrolled type 2 diabetes mellitus without complication, without long-term current use of insulin (HCC) -     POCT glucose (manual entry) -     POCT glycosylated hemoglobin (Hb A1C) -     glimepiride (AMARYL) 4 MG tablet; Take 2 tablets (8 mg total) by mouth daily before breakfast.  Other orders -     Discontinue: sitaGLIPtin (JANUVIA) 100 MG tablet; Take 1 tablet (100 mg total) by mouth daily. -     sitaGLIPtin (JANUVIA) 100 MG tablet; Take 1 tablet (100 mg total) by mouth daily.  increase amaryl to 8 mg before breakfast  Take xyzal in the evening   f/u in 6 weeks for diabetes, sooner if needed Call if sinus infection symptoms do not resolve  Dr. Adrian Blackwater

## 2016-10-07 NOTE — Assessment & Plan Note (Addendum)
Acute sinusitis Plan: Finish amoxicillin Add xyzal antihistamine Continue Flonase and nasal saline Plan for Augmentin treatment if symptoms fail to resolve

## 2016-10-07 NOTE — Progress Notes (Signed)
Subjective:  [name], male    DOB: [birth year]  Age: 46 y.o. MRN: [id number]  CC: Diabetes   HPI Paul [name] presents for   1. CHRONIC DIABETES dx in 2014.   Disease Monitoring  Blood Sugar Ranges:   Fasting:  175  Postprandial: 135-241     Polyuria: no   Visual problems: no   Medication Compliance: yes metformin 1000 mg BID and amaryl 4 mg daily, januvia 100 mg daily  Medication Side Effects  Hypoglycemia: no   Diet: low carb   Exercise: He walks 2.5 miles, 5 days a week.  He eats grilled chicken salad and no dressing for lunch, he eats oatmeal with cinnamon and 1/2 banana at breakfast. He has lost weight.   2. Recent sinus infection: R sided. Taking amoxicillin. Has 2 days left. No fever or chills. Still with some congestion. Also having some R sided frontal and maxillary sinus pressure. Using nasal saline during the day and Flonase at night. No history of seasonal allergies.  Social History  Substance Use Topics  . Smoking status: Never Smoker  . Smokeless tobacco: Never Used  . Alcohol use Yes     Comment: Social    Outpatient Medications Prior to Visit  Medication Sig Dispense Refill  . glimepiride (AMARYL) 2 MG tablet Take 2 tablets (4 mg total) by mouth daily before breakfast. 60 tablet 3  . metFORMIN (GLUCOPHAGE) 1000 MG tablet Take 1 tablet (1,000 mg total) by mouth 2 (two) times daily with a meal. 180 tablet 3  . pravastatin (PRAVACHOL) 40 MG tablet Take 1 tablet (40 mg total) by mouth daily. 90 tablet 3  . sitaGLIPtin (JANUVIA) 100 MG tablet Take 1 tablet (100 mg total) by mouth daily. 30 tablet 2  . terbinafine (LAMISIL) 250 MG tablet Take 1 tablet (250 mg total) by mouth daily. For 12 weeks 30 tablet 2   No facility-administered medications prior to visit.     ROS Review of Systems  Constitutional: Negative for chills, fatigue, fever and unexpected weight change.  HENT: Positive for congestion and sinus pressure.   Eyes:  Negative for visual disturbance.  Respiratory: Negative for cough and shortness of breath.   Cardiovascular: Negative for chest pain, palpitations and leg swelling.  Gastrointestinal: Negative for abdominal pain, blood in stool, constipation, diarrhea, nausea and vomiting.  Endocrine: Negative for polydipsia, polyphagia and polyuria.  Musculoskeletal: Negative for arthralgias, back pain, gait problem, myalgias and neck pain.  Skin: Negative for rash.  Allergic/Immunologic: Negative for immunocompromised state.  Hematological: Negative for adenopathy. Does not bruise/bleed easily.  Psychiatric/Behavioral: Negative for dysphoric mood, sleep disturbance and suicidal ideas. The patient is not nervous/anxious.     Objective:  BP 118/75   Pulse 71   Temp 98.2 F (36.8 C) (Oral)   Ht 5' 10.5" (1.791 m)   Wt 181 lb 12.8 oz (82.5 kg)   SpO2 100%   BMI 25.72 kg/m   BP/Weight 10/07/2016 08/26/2016 7/61/9509  Systolic BP 326 712 458  Diastolic BP 75 70 82  Wt. (Lbs) 181.8 181 182.4  BMI 25.72 25.6 25.8   Physical Exam  Constitutional: He appears well-developed and well-nourished. No distress.  HENT:  Head: Normocephalic and atraumatic.  Right Ear: Tympanic membrane, external ear and ear canal normal.  Left Ear: Tympanic membrane and external ear normal.  Nose: Nose normal. No mucosal edema. Right sinus exhibits no maxillary sinus tenderness and no frontal sinus tenderness. Left sinus exhibits no maxillary  sinus tenderness and no frontal sinus tenderness.  Mouth/Throat: Oropharynx is clear and moist and mucous membranes are normal.  Neck: Normal range of motion. Neck supple.  Cardiovascular: Normal rate, regular rhythm, normal heart sounds and intact distal pulses.   Pulmonary/Chest: Effort normal and breath sounds normal.  Musculoskeletal: He exhibits no edema.  Neurological: He is alert.  Skin: Skin is warm and dry. No rash noted. No erythema.  Psychiatric: He has a normal mood and  affect.    Lab Results  Component Value Date   HGBA1C 12.6 08/12/2016   CBG 144  Assessment & Plan:   Jamisen was seen today for diabetes.  Diagnoses and all orders for this visit:  Uncontrolled type 2 diabetes mellitus without complication, without long-term current use of insulin (HCC) -     POCT glucose (manual entry) -     POCT glycosylated hemoglobin (Hb A1C) -     glimepiride (AMARYL) 4 MG tablet; Take 2 tablets (8 mg total) by mouth daily before breakfast.  Acute non-recurrent frontal sinusitis  Other orders -     Discontinue: sitaGLIPtin (JANUVIA) 100 MG tablet; Take 1 tablet (100 mg total) by mouth daily. -     sitaGLIPtin (JANUVIA) 100 MG tablet; Take 1 tablet (100 mg total) by mouth daily.    No orders of the defined types were placed in this encounter.   Follow-up: Return in about 6 weeks (around 11/18/2016) for diabetes .   Boykin Nearing MD

## 2016-10-07 NOTE — Assessment & Plan Note (Signed)
A; improving Compliant Increase amaryl to 8 mg in AM

## 2016-10-14 ENCOUNTER — Encounter: Payer: Self-pay | Admitting: Gastroenterology

## 2016-11-12 ENCOUNTER — Encounter: Payer: Self-pay | Admitting: Family Medicine

## 2016-11-18 ENCOUNTER — Encounter: Payer: Self-pay | Admitting: Gastroenterology

## 2016-11-18 ENCOUNTER — Encounter: Payer: Self-pay | Admitting: Family Medicine

## 2016-11-18 ENCOUNTER — Ambulatory Visit: Attending: Family Medicine | Admitting: Family Medicine

## 2016-11-18 ENCOUNTER — Ambulatory Visit (INDEPENDENT_AMBULATORY_CARE_PROVIDER_SITE_OTHER): Admitting: Gastroenterology

## 2016-11-18 VITALS — BP 106/60 | HR 80 | Ht 70.0 in | Wt 185.0 lb

## 2016-11-18 VITALS — BP 103/67 | HR 71 | Temp 97.4°F | Wt 184.4 lb

## 2016-11-18 DIAGNOSIS — Z7984 Long term (current) use of oral hypoglycemic drugs: Secondary | ICD-10-CM | POA: Diagnosis not present

## 2016-11-18 DIAGNOSIS — Z86010 Personal history of colonic polyps: Secondary | ICD-10-CM

## 2016-11-18 DIAGNOSIS — Z79899 Other long term (current) drug therapy: Secondary | ICD-10-CM | POA: Diagnosis not present

## 2016-11-18 DIAGNOSIS — E1165 Type 2 diabetes mellitus with hyperglycemia: Secondary | ICD-10-CM | POA: Diagnosis not present

## 2016-11-18 DIAGNOSIS — B351 Tinea unguium: Secondary | ICD-10-CM | POA: Diagnosis not present

## 2016-11-18 DIAGNOSIS — IMO0001 Reserved for inherently not codable concepts without codable children: Secondary | ICD-10-CM

## 2016-11-18 LAB — POCT GLYCOSYLATED HEMOGLOBIN (HGB A1C): Hemoglobin A1C: 7.3

## 2016-11-18 LAB — GLUCOSE, POCT (MANUAL RESULT ENTRY): POC Glucose: 154 mg/dl — AB (ref 70–99)

## 2016-11-18 MED ORDER — TERBINAFINE HCL 250 MG PO TABS: 250.0000 mg | ORAL_TABLET | Freq: Every day | ORAL | 2 refills | Status: DC

## 2016-11-18 MED ORDER — NA SULFATE-K SULFATE-MG SULF 17.5-3.13-1.6 GM/177ML PO SOLN: ORAL | 0 refills | Status: DC

## 2016-11-18 NOTE — Progress Notes (Signed)
Subjective:  [name], male    DOB: [birth year]  Age: 46 y.o. MRN: [id number]  CC: No chief complaint on file.   HPI NYEEM STOKE presents for   1. CHRONIC DIABETES dx in 2014.   Disease Monitoring  Blood Sugar Ranges:   Fasting:   131-224  Postprandial: not checking   Polyuria: no   Visual problems: no   Medication Compliance: yes metformin 1000 mg BID and amaryl 4 mg daily, januvia 100 mg daily  Medication Side Effects  Hypoglycemia: yes x 2 days 5/10 and 5/15 CBG of 94 and 68 around 11:30 AM   Diet: low carb   Exercise: He walks 2.5 miles, 5 days a week.  He eats grilled chicken salad and no dressing for lunch, he eats oatmeal with cinnamon and 1/2 banana at breakfast. He has lost weight. 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    Social History  Substance Use Topics  . Smoking status: Never Smoker  . Smokeless tobacco: Never Used  . Alcohol use Yes     Comment: Social    Outpatient Medications Prior to Visit  Medication Sig Dispense Refill  . glimepiride (AMARYL) 4 MG tablet Take 2 tablets (8 mg total) by mouth daily before breakfast. 60 tablet 5  . metFORMIN (GLUCOPHAGE) 1000 MG tablet Take 1 tablet (1,000 mg total) by mouth 2 (two) times daily with a meal. 180 tablet 3  . pravastatin (PRAVACHOL) 40 MG tablet Take 1 tablet (40 mg total) by mouth daily. 90 tablet 3  . sitaGLIPtin (JANUVIA) 100 MG tablet Take 1 tablet (100 mg total) by mouth daily. 90 tablet 3  . terbinafine (LAMISIL) 250 MG tablet Take 1 tablet (250 mg total) by mouth daily. For 12 weeks 30 tablet 2   No facility-administered medications prior to visit.     ROS Review of Systems  Constitutional: Negative for chills, fatigue, fever and unexpected weight change.  HENT: Positive for congestion and sinus pressure.   Eyes: Negative for visual disturbance.  Respiratory: Negative for cough and shortness of breath.   Cardiovascular:  Negative for chest pain, palpitations and leg swelling.  Gastrointestinal: Negative for abdominal pain, blood in stool, constipation, diarrhea, nausea and vomiting.  Endocrine: Negative for polydipsia, polyphagia and polyuria.  Musculoskeletal: Negative for arthralgias, back pain, gait problem, myalgias and neck pain.  Skin: Negative for rash.  Allergic/Immunologic: Negative for immunocompromised state.  Hematological: Negative for adenopathy. Does not bruise/bleed easily.  Psychiatric/Behavioral: Negative for dysphoric mood, sleep disturbance and suicidal ideas. The patient is not nervous/anxious.     Objective:  BP 103/67   Pulse 71   Temp 97.4 F (36.3 C) (Oral)   Wt 184 lb 6.4 oz (83.6 kg)   SpO2 100%   BMI 26.08 kg/m   BP/Weight 11/18/2016 10/07/2016 2/50/0370  Systolic BP 488 891 694  Diastolic BP 67 75 70  Wt. (Lbs) 184.4 181.8 181  BMI 26.08 25.72 25.6   Physical Exam  Constitutional: He appears well-developed and well-nourished. No distress.  HENT:  Head: Normocephalic and atraumatic.  Right Ear: Tympanic membrane, external ear and ear canal normal.  Left Ear: Tympanic membrane and external ear normal.  Nose: Nose normal. No mucosal edema. Right sinus exhibits no maxillary sinus tenderness and no frontal sinus tenderness. Left sinus exhibits no maxillary sinus tenderness and no frontal sinus tenderness.  Mouth/Throat: Oropharynx is clear and moist and mucous membranes are normal.  Neck: Normal range of motion. Neck supple.  Cardiovascular: Normal rate, regular rhythm, normal heart sounds and intact distal pulses.   Pulmonary/Chest: Effort normal and breath sounds normal.  Musculoskeletal: He exhibits no edema.  Neurological: He is alert.  Skin: Skin is warm and dry. No rash noted. No erythema.  Psychiatric: He has a normal mood and affect.    Lab Results  Component Value Date   HGBA1C 9.0 10/07/2016   Lab Results  Component Value Date   HGBA1C 7.3 11/18/2016     CBG 154  Assessment & Plan:   Javares was seen today for diabetes.  Diagnoses and all orders for this visit:  Uncontrolled type 2 diabetes mellitus without complication, without long-term current use of insulin (HCC) -     POCT glucose (manual entry) -     POCT glycosylated hemoglobin (Hb A1C)  Onychomycosis of right great toe -     terbinafine (LAMISIL) 250 MG tablet; Take 1 tablet (250 mg total) by mouth daily. For 12 weeks    No orders of the defined types were placed in this encounter.   Follow-up: No Follow-up on file.   Boykin Nearing MD

## 2016-11-18 NOTE — Assessment & Plan Note (Signed)
A: improving, A1c is near goal Two evenings of low sugars P: Continue current regimen Evening snack avoid excessive evening exercise

## 2016-11-18 NOTE — Progress Notes (Signed)
HPI: This is a very pleasant 46 year old Paul [name]  who was referred to me by Boykin Nearing, MD  to evaluate  personal history of colon polyps .    Chief complaint is personal history of colon polyps   He underwent a colonoscopy due to anal itching, bleeding. While in [employer].  No FH of colon cancer. He has had no trouble with bleeding. No constipation or diarrhea. No significant changes in his bowel habits   Overall weight down, losing weight intentionally after diagnosed with DM 2  Old Data Reviewed:   I reviewed records from [facility]. Colonoscopy March 2013. The actual colonoscopy report is not available however the pathology report is available and it shows 2 out of 3 of the polyps that were removed were hyperplastic and one was a sessile serrated adenoma.   Review of systems: Pertinent positive and negative review of systems were noted in the above HPI section. All other review negative.   Past Medical History:  Diagnosis Date  . Diabetes mellitus without complication ([facility])   . Hyperlipidemia   . Kidney stones   . Sleep apnea     Past Surgical History:  Procedure Laterality Date  . KNEE ARTHROSCOPY Right   . UMBILICAL HERNIA REPAIR      Current Outpatient Prescriptions  Medication Sig Dispense Refill  . glimepiride (AMARYL) 4 MG tablet Take 2 tablets (8 mg total) by mouth daily before breakfast. Paul tablet 5  . metFORMIN (GLUCOPHAGE) 1000 MG tablet Take 1 tablet (1,000 mg total) by mouth 2 (two) times daily with a meal. 180 tablet 3  . pravastatin (PRAVACHOL) 40 MG tablet Take 1 tablet (40 mg total) by mouth daily. 90 tablet 3  . sitaGLIPtin (JANUVIA) 100 MG tablet Take 1 tablet (100 mg total) by mouth daily. 90 tablet 3  . terbinafine (LAMISIL) 250 MG tablet Take 1 tablet (250 mg total) by mouth daily. For 12 weeks 30 tablet 2   No current facility-administered medications for this visit.     Allergies as of 11/18/2016 - Review Complete 11/18/2016  Allergen  Reaction Noted  . Codeine Nausea And Vomiting and Other (See Comments) 05/24/2013    Family History  Problem Relation Age of Onset  . Hypertension Father   . Diabetes Mother   . Sleep apnea Other   . Heart attack Maternal Grandfather   . Heart attack Paternal Grandfather   . Cancer Neg Hx     Social History   Social History  . Marital status: Married    Spouse name: N/A  . Number of children: 2  . Years of education: N/A   Occupational History  . Security officer    Social History Main Topics  . Smoking status: Never Smoker  . Smokeless tobacco: Never Used  . Alcohol use Yes     Comment: Social  . Drug use: No  . Sexual activity: Not on file   Other Topics Concern  . Not on file   Social History Narrative  . No narrative on file     Physical Exam: BP 106/Paul   Pulse 80   Ht 5\' 10"  (1.778 m)   Wt 185 lb (83.9 kg)   SpO2 96%   BMI 26.54 kg/m  Constitutional: generally well-appearing Psychiatric: alert and oriented x3 Eyes: extraocular movements intact Mouth: oral pharynx moist, no lesions Neck: supple no lymphadenopathy Cardiovascular: heart regular rate and rhythm Lungs: clear to auscultation bilaterally Abdomen: soft, nontender, nondistended, no obvious ascites, no peritoneal signs,  normal bowel sounds Extremities: no lower extremity edema bilaterally Skin: no lesions on visible extremities   Assessment and plan: 46 y.o. male with  Personal history of precancerous colon polyp  I recommended we proceed with repeat colonoscopy at his soonest convenience. Fridays work for him better in terms of work schedule and I'm sure will be able to find one in the near future that will work. I see no reason for any further blood tests or imaging studies prior to then. He does understand that if no polyps or cancers or found and he would  probably be safe for 10 years for he needs repeat colon cancer screening.   Please see the "Patient Instructions" section for  addition details about the plan.   Owens Loffler, MD [facility] 11/18/2016, 3:20 PM  Cc: Boykin Nearing, MD

## 2016-11-18 NOTE — Patient Instructions (Addendum)
Paul [name] was seen today for diabetes.  Diagnoses and all orders for this visit:  Uncontrolled type 2 diabetes mellitus without complication, without long-term current use of insulin (HCC) -     POCT glucose (manual entry) -     POCT glycosylated hemoglobin (Hb A1C)  Onychomycosis of right great toe -     terbinafine (LAMISIL) 250 MG tablet; Take 1 tablet (250 mg total) by mouth daily. For 12 weeks   Start checking 2 hrs after dinner 2-3 times per week Keep an evening snack on hand for evening sugars below 130 especially if working the evening job Avoid walking for extra exercise on the evenings you work   Diabetes blood sugar goals  Fasting (in AM before breakfast, 8 hrs of no eating or drinking (except water or unsweetened coffee or tea): 90-110 2 hrs after meals: < 160,   No low sugars: nothing < 70   F/u in 2 months for diabetes and cholesterol check   Dr. Adrian Blackwater

## 2016-11-18 NOTE — Patient Instructions (Addendum)
You have been scheduled for a colonoscopy. Please follow written instructions given to you at your visit today.  Please pick up your prep supplies at the pharmacy within the next 1-3 days. If you use inhalers (even only as needed), please bring them with you on the day of your procedure. Your physician has requested that you go to www.startemmi.com and enter the access code given to you at your visit today. This web site gives a general overview about your procedure. However, you should still follow specific instructions given to you by our office regarding your preparation for the procedure.  Your body mass index should be between 19-25. Your Body mass index is 26.54 kg/m. If this is out of the aformentioned range listed, please consider follow up with your Primary Care Provider.

## 2017-01-09 ENCOUNTER — Encounter: Payer: Self-pay | Admitting: Gastroenterology

## 2017-01-09 ENCOUNTER — Ambulatory Visit (AMBULATORY_SURGERY_CENTER): Admitting: Gastroenterology

## 2017-01-09 VITALS — BP 108/79 | HR 67 | Temp 97.8°F | Resp 18 | Ht 70.0 in | Wt 185.0 lb

## 2017-01-09 DIAGNOSIS — K635 Polyp of colon: Secondary | ICD-10-CM | POA: Diagnosis not present

## 2017-01-09 DIAGNOSIS — D125 Benign neoplasm of sigmoid colon: Secondary | ICD-10-CM

## 2017-01-09 DIAGNOSIS — Z86010 Personal history of colonic polyps: Secondary | ICD-10-CM

## 2017-01-09 MED ORDER — SODIUM CHLORIDE 0.9 % IV SOLN: 500.0000 mL | INTRAVENOUS | Status: DC

## 2017-01-09 NOTE — Progress Notes (Signed)
Called to room to assist during endoscopic procedure.  Patient ID and intended procedure confirmed with present staff. Received instructions for my participation in the procedure from the performing physician.  

## 2017-01-09 NOTE — Op Note (Signed)
[facility] Patient Name: Paul [name] Procedure Date: 01/09/2017 1:10 PM MRN: [id number] Endoscopist: Milus Banister , MD Age: 46 Referring MD:  Date of Birth: 1971/03/31 Gender: Male Account #: [id number] Procedure:                Colonoscopy Indications:              High risk colon cancer surveillance: Personal                            history of colonic polyps: colonoscopy 2013 three                            polyps removed, two were HPs one was SSA Medicines:                Monitored Anesthesia Care Procedure:                Pre-Anesthesia Assessment:                           - Prior to the procedure, a History and Physical                            was performed, and patient medications and                            allergies were reviewed. The patient's tolerance of                            previous anesthesia was also reviewed. The risks                            and benefits of the procedure and the sedation                            options and risks were discussed with the patient.                            All questions were answered, and informed consent                            was obtained. Prior Anticoagulants: The patient has                            taken no previous anticoagulant or antiplatelet                            agents. ASA Grade Assessment: I - A normal, healthy                            patient. After reviewing the risks and benefits,                            the patient was deemed in satisfactory condition to  undergo the procedure.                           After obtaining informed consent, the colonoscope                            was passed under direct vision. Throughout the                            procedure, the patient's blood pressure, pulse, and                            oxygen saturations were monitored continuously. The                            Model CF-HQ190L [id number])  scope was introduced                            through the anus and advanced to the the cecum,                            identified by appendiceal orifice and ileocecal                            valve. The colonoscopy was performed without                            difficulty. The patient tolerated the procedure                            well. The quality of the bowel preparation was                            good. The ileocecal valve, appendiceal orifice, and                            rectum were photographed. Scope In: 1:12:39 PM Scope Out: 1:25:26 PM Scope Withdrawal Time: 0 hours 8 minutes 31 seconds  Total Procedure Duration: 0 hours 12 minutes 47 seconds  Findings:                 A 6 mm polyp was found in the sigmoid colon. The                            polyp was sessile. The polyp was removed with a                            cold snare. Resection and retrieval were complete.                           The exam was otherwise without abnormality on                            direct and retroflexion views. Complications:  No immediate complications. Estimated blood loss:                            None. Estimated Blood Loss:     Estimated blood loss: none. Impression:               - One 6 mm polyp in the sigmoid colon, removed with                            a cold snare. Resected and retrieved.                           - The examination was otherwise normal on direct                            and retroflexion views. Recommendation:           - Patient has a contact number available for                            emergencies. The signs and symptoms of potential                            delayed complications were discussed with the                            patient. Return to normal activities tomorrow.                            Written discharge instructions were provided to the                            patient.                           - Resume previous  diet.                           - Continue present medications.                           You will receive a letter within 2-3 weeks with the                            pathology results and my final recommendations.                           If the polyp(s) is proven to be 'pre-cancerous' on                            pathology, you will need repeat colonoscopy in 5                            years. If the polyp(s) is NOT 'precancerous' on  pathology then you should repeat colon cancer                            screening in 10 years with colonoscopy without need                            for colon cancer screening by any method prior to                            then (including stool testing). Milus Banister, MD 01/09/2017 1:28:03 PM This report has been signed electronically.

## 2017-01-09 NOTE — Progress Notes (Signed)
Report to PACU, RN, vss, BBS= Clear.  

## 2017-01-09 NOTE — Patient Instructions (Signed)
Impression:  [place] (handout given)  YOU HAD AN ENDOSCOPIC PROCEDURE TODAY AT [facility]:   Refer to the procedure report that was given to you for any specific questions about what was found during the examination.  If the procedure report does not answer your questions, please call your gastroenterologist to clarify.  If you requested that your care partner not be given the details of your procedure findings, then the procedure report has been included in a sealed envelope for you to review at your convenience later.  YOU SHOULD EXPECT: Some feelings of bloating in the abdomen. Passage of more gas than usual.  Walking can help get rid of the air that was put into your GI tract during the procedure and reduce the bloating. If you had a lower endoscopy (such as a colonoscopy or flexible sigmoidoscopy) you may notice spotting of blood in your stool or on the toilet paper. If you underwent a bowel prep for your procedure, you may not have a normal bowel movement for a few days.  Please Note:  You might notice some irritation and congestion in your nose or some drainage.  This is from the oxygen used during your procedure.  There is no need for concern and it should clear up in a day or so.  SYMPTOMS TO REPORT IMMEDIATELY:   Following lower endoscopy (colonoscopy or flexible sigmoidoscopy):  Excessive amounts of blood in the stool  Significant tenderness or worsening of abdominal pains  Swelling of the abdomen that is new, acute  Fever of 100F or higher  For urgent or emergent issues, a gastroenterologist can be reached at any hour by calling [phone].   DIET:  We do recommend a small meal at first, but then you may proceed to your regular diet.  Drink plenty of fluids but you should avoid alcoholic beverages for 24 hours.  ACTIVITY:  You should plan to take it easy for the rest of today and you should NOT DRIVE or use heavy machinery until tomorrow (because of the  sedation medicines used during the test).    FOLLOW UP: Our staff will call the number listed on your records the next business day following your procedure to check on you and address any questions or concerns that you may have regarding the information given to you following your procedure. If we do not reach you, we will leave a message.  However, if you are feeling well and you are not experiencing any problems, there is no need to return our call.  We will assume that you have returned to your regular daily activities without incident.  If any biopsies were taken you will be contacted by phone or by letter within the next 1-3 weeks.  Please call us at [phone] if you have not heard about the biopsies in 3 weeks.    SIGNATURES/CONFIDENTIALITY: You and/or your care partner have signed paperwork which will be entered into your electronic medical record.  These signatures attest to the fact that that the information above on your After Visit Summary has been reviewed and is understood.  Full responsibility of the confidentiality of this discharge information lies with you and/or your care-partner.

## 2017-01-12 ENCOUNTER — Telehealth: Payer: Self-pay | Admitting: *Deleted

## 2017-01-12 NOTE — Telephone Encounter (Signed)
  Follow up Call-  Call back number 01/09/2017  Post procedure Call Back phone  # [phone]  Permission to leave phone message Yes  Some recent data might be hidden     Patient questions:  Message left to call us if necessary.  Second call.

## 2017-01-12 NOTE — Telephone Encounter (Signed)
  Follow up Call-  Call back number 01/09/2017  Post procedure Call Back phone  # [phone]  Permission to leave phone message Yes  Some recent data might be hidden     No answer, left message

## 2017-01-15 ENCOUNTER — Encounter: Payer: Self-pay | Admitting: Gastroenterology

## 2017-01-19 ENCOUNTER — Ambulatory Visit: Attending: Family Medicine | Admitting: Family Medicine

## 2017-01-19 ENCOUNTER — Encounter: Payer: Self-pay | Admitting: Family Medicine

## 2017-01-19 VITALS — BP 120/77 | HR 69 | Temp 97.7°F | Ht 70.0 in | Wt 191.6 lb

## 2017-01-19 DIAGNOSIS — E1165 Type 2 diabetes mellitus with hyperglycemia: Secondary | ICD-10-CM | POA: Diagnosis not present

## 2017-01-19 DIAGNOSIS — Z7984 Long term (current) use of oral hypoglycemic drugs: Secondary | ICD-10-CM | POA: Diagnosis not present

## 2017-01-19 DIAGNOSIS — E1169 Type 2 diabetes mellitus with other specified complication: Secondary | ICD-10-CM | POA: Diagnosis not present

## 2017-01-19 DIAGNOSIS — E785 Hyperlipidemia, unspecified: Secondary | ICD-10-CM | POA: Diagnosis not present

## 2017-01-19 DIAGNOSIS — Z79899 Other long term (current) drug therapy: Secondary | ICD-10-CM | POA: Diagnosis not present

## 2017-01-19 DIAGNOSIS — IMO0001 Reserved for inherently not codable concepts without codable children: Secondary | ICD-10-CM

## 2017-01-19 DIAGNOSIS — E119 Type 2 diabetes mellitus without complications: Secondary | ICD-10-CM | POA: Insufficient documentation

## 2017-01-19 LAB — POCT GLYCOSYLATED HEMOGLOBIN (HGB A1C): HEMOGLOBIN A1C: 7.2

## 2017-01-19 LAB — GLUCOSE, POCT (MANUAL RESULT ENTRY): POC GLUCOSE: 250 mg/dL — AB (ref 70–99)

## 2017-01-19 MED ORDER — "INSULIN SYRINGE-NEEDLE U-100 31G X 5/16"" 0.5 ML MISC": 1.0000 | Freq: Every day | 2 refills | Status: DC

## 2017-01-19 MED ORDER — INSULIN GLARGINE 100 UNIT/ML ~~LOC~~ SOLN: 10.0000 [IU] | Freq: Every day | SUBCUTANEOUS | 2 refills | Status: DC

## 2017-01-19 NOTE — Progress Notes (Addendum)
Subjective:  [name], male    DOB: 04-02-1971  Age: 46 y.o. MRN: [id number]  CC: Diabetes   HPI Paul [name] presents for   1. CHRONIC DIABETES dx in 04/2013 after being out of the [employer] for one year. He served for 24 years.  Disease Monitoring  Blood Sugar Ranges:   Fasting:   90-190  Postprandial: not checking   Polyuria: no   Visual problems: no   Medication Compliance: yes metformin 1000 mg BID and amaryl 4 mg daily, januvia 100 mg daily  Medication Side Effects  Hypoglycemia: no  Diet: low carb   Exercise: He walks 2.5 miles, 5 days a week.  He eats grilled chicken salad and no dressing for lunch, he eats oatmeal with cinnamon and 1/2 banana at breakfast. He has lost weight. 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    Social History  Substance Use Topics  . Smoking status: Never Smoker  . Smokeless tobacco: Never Used  . Alcohol use Yes     Comment: Social    Outpatient Medications Prior to Visit  Medication Sig Dispense Refill  . glimepiride (AMARYL) 4 MG tablet Take 2 tablets (8 mg total) by mouth daily before breakfast. 60 tablet 5  . metFORMIN (GLUCOPHAGE) 1000 MG tablet Take 1 tablet (1,000 mg total) by mouth 2 (two) times daily with a meal. 180 tablet 3  . Na Sulfate-K Sulfate-Mg Sulf 17.5-3.13-1.6 GM/180ML SOLN Suprep-Use as directed 354 mL 0  . pravastatin (PRAVACHOL) 40 MG tablet Take 1 tablet (40 mg total) by mouth daily. 90 tablet 3  . sitaGLIPtin (JANUVIA) 100 MG tablet Take 1 tablet (100 mg total) by mouth daily. 90 tablet 3  . terbinafine (LAMISIL) 250 MG tablet Take 1 tablet (250 mg total) by mouth daily. For 12 weeks 30 tablet 2   Facility-Administered Medications Prior to Visit  Medication Dose Route Frequency Provider Last Rate Last Dose  . 0.9 %  sodium chloride infusion  500 mL Intravenous Continuous Milus Banister, MD        ROS Review of Systems  Constitutional:  Negative for chills, fatigue, fever and unexpected weight change.  HENT: Positive for congestion and sinus pressure.   Eyes: Negative for visual disturbance.  Respiratory: Negative for cough and shortness of breath.   Cardiovascular: Negative for chest pain, palpitations and leg swelling.  Gastrointestinal: Negative for abdominal pain, blood in stool, constipation, diarrhea, nausea and vomiting.  Endocrine: Negative for polydipsia, polyphagia and polyuria.  Musculoskeletal: Negative for arthralgias, back pain, gait problem, myalgias and neck pain.  Skin: Negative for rash.  Allergic/Immunologic: Negative for immunocompromised state.  Hematological: Negative for adenopathy. Does not bruise/bleed easily.  Psychiatric/Behavioral: Negative for dysphoric mood, sleep disturbance and suicidal ideas. The patient is not nervous/anxious.     Objective:  BP 120/77   Pulse 69   Temp 97.7 F (36.5 C) (Oral)   Ht 5\' 10"  (1.778 m)   Wt 191 lb 9.6 oz (86.9 kg)   SpO2 99%   BMI 27.49 kg/m   BP/Weight 01/09/2017 11/18/2016 5/46/5681  Systolic BP 275 170 017  Diastolic BP 79 60 67  Wt. (Lbs) 185 185 184.4  BMI 26.54 26.54 26.08   Physical Exam  Constitutional: He appears well-developed and well-nourished. No distress.  HENT:  Head: Normocephalic and atraumatic.  Right Ear: Tympanic membrane, external ear and ear canal normal.  Left Ear: Tympanic membrane and external ear  normal.  Nose: Nose normal. No mucosal edema. Right sinus exhibits no maxillary sinus tenderness and no frontal sinus tenderness. Left sinus exhibits no maxillary sinus tenderness and no frontal sinus tenderness.  Mouth/Throat: Oropharynx is clear and moist and mucous membranes are normal.  Neck: Normal range of motion. Neck supple.  Cardiovascular: Normal rate, regular rhythm, normal heart sounds and intact distal pulses.   Pulmonary/Chest: Effort normal and breath sounds normal.  Musculoskeletal: He exhibits no edema.    Neurological: He is alert.  Skin: Skin is warm and dry. No rash noted. No erythema.  Psychiatric: He has a normal mood and affect.    Lab Results  Component Value Date   HGBA1C 7.3 11/18/2016   Lab Results  Component Value Date   HGBA1C 7.2 01/19/2017    CBG 250 Assessment & Plan:   Paul [name] was seen today for diabetes.  Diagnoses and all orders for this visit:  Uncontrolled type 2 diabetes mellitus without complication, without long-term current use of insulin (HCC) -     POCT glucose (manual entry) -     POCT glycosylated hemoglobin (Hb A1C) -     insulin glargine (LANTUS) 100 UNIT/ML injection; Inject 0.1 mLs (10 Units total) into the skin at bedtime. -     Insulin Syringe-Needle U-100 (B-D INS SYRINGE 0.5CC/31GX5/16) 31G X 5/16" 0.5 ML MISC; 1 each by Does not apply route at bedtime. -     Insulin and C-Peptide; Future  Dyslipidemia associated with type 2 diabetes mellitus ([place]) -     Lipid Panel; Future    No orders of the defined types were placed in this encounter.   Follow-up: Return in about 2 weeks (around 02/02/2017) for diabetes/ follow up lantus .   Boykin Nearing MD

## 2017-01-19 NOTE — Patient Instructions (Addendum)
[place] was seen today for diabetes.  Diagnoses and all orders for this visit:  Uncontrolled type 2 diabetes mellitus without complication, without long-term current use of insulin (HCC) -     POCT glucose (manual entry) -     POCT glycosylated hemoglobin (Hb A1C) -     insulin glargine (LANTUS) 100 UNIT/ML injection; Inject 0.1 mLs (10 Units total) into the skin at bedtime. -     Insulin Syringe-Needle U-100 (B-D INS SYRINGE 0.5CC/31GX5/16) 31G X 5/16" 0.5 ML MISC; 1 each by Does not apply route at bedtime. -     Insulin and C-Peptide; Future  Dyslipidemia associated with type 2 diabetes mellitus ([place]) -     Lipid Panel; Future   Please start lantus to help reach A1c goal and reduce fasting sugars   Diabetes blood sugar goals  Fasting (in AM before breakfast, 8 hrs of no eating or drinking (except water or unsweetened coffee or tea): 90-110 2 hrs after meals: < 160,   No low sugars: nothing < 70    If you have low sugars the plan will be to stop amaryl  F/u in 2 weeks for diabetes/ follow up lantus   Dr. Adrian Blackwater

## 2017-01-19 NOTE — Assessment & Plan Note (Signed)
A: elevated fasting sugars Med: compliant P: Add lantus 10 U nightly Continue metformin 1000 mg BID Amaryl 8 mg in AM januvia  100 mg in AM  Checking insulin C-peptide Will stop amaryl if hypoglycemia occurs

## 2017-01-20 ENCOUNTER — Ambulatory Visit: Attending: Family Medicine

## 2017-01-20 DIAGNOSIS — E1165 Type 2 diabetes mellitus with hyperglycemia: Principal | ICD-10-CM

## 2017-01-20 DIAGNOSIS — E1169 Type 2 diabetes mellitus with other specified complication: Secondary | ICD-10-CM

## 2017-01-20 DIAGNOSIS — E785 Hyperlipidemia, unspecified: Secondary | ICD-10-CM

## 2017-01-20 DIAGNOSIS — IMO0001 Reserved for inherently not codable concepts without codable children: Secondary | ICD-10-CM

## 2017-01-21 ENCOUNTER — Telehealth: Payer: Self-pay

## 2017-01-21 LAB — LIPID PANEL
Chol/HDL Ratio: 3.1 ratio (ref 0.0–5.0)
Cholesterol, Total: 134 mg/dL (ref 100–199)
HDL: 43 mg/dL (ref >39)
LDL Calculated: 77 mg/dL (ref 0–99)
TRIGLYCERIDES: 68 mg/dL (ref 0–149)
VLDL CHOLESTEROL CAL: 14 mg/dL (ref 5–40)

## 2017-01-21 LAB — INSULIN AND C-PEPTIDE, SERUM
C PEPTIDE: 0.9 ng/mL — AB (ref 1.1–4.4)
INSULIN: 3 u[IU]/mL (ref 2.6–24.9)

## 2017-01-21 NOTE — Telephone Encounter (Signed)
Pt was called and informed of lab results. 

## 2017-01-23 ENCOUNTER — Telehealth: Payer: Self-pay

## 2017-01-23 NOTE — Telephone Encounter (Signed)
Pt was called and informed of lab results. 

## 2017-02-03 ENCOUNTER — Encounter: Payer: Self-pay | Admitting: Family Medicine

## 2017-02-03 ENCOUNTER — Ambulatory Visit: Attending: Family Medicine | Admitting: Family Medicine

## 2017-02-03 VITALS — BP 109/70 | HR 79 | Temp 97.8°F | Ht 70.0 in | Wt 185.8 lb

## 2017-02-03 DIAGNOSIS — Z794 Long term (current) use of insulin: Secondary | ICD-10-CM | POA: Diagnosis not present

## 2017-02-03 DIAGNOSIS — E1165 Type 2 diabetes mellitus with hyperglycemia: Secondary | ICD-10-CM

## 2017-02-03 DIAGNOSIS — IMO0001 Reserved for inherently not codable concepts without codable children: Secondary | ICD-10-CM

## 2017-02-03 LAB — GLUCOSE, POCT (MANUAL RESULT ENTRY): POC Glucose: 90 mg/dl (ref 70–99)

## 2017-02-03 MED ORDER — GLIMEPIRIDE 4 MG PO TABS: 8.0000 mg | ORAL_TABLET | Freq: Every day | ORAL | 5 refills | Status: DC

## 2017-02-03 NOTE — Patient Instructions (Addendum)
Paul [name] was seen today for diabetes.  Diagnoses and all orders for this visit:  Uncontrolled type 2 diabetes mellitus without complication, without long-term current use of insulin (HCC) -     POCT glucose (manual entry)   Continue metformin 1000 mg twice daily Continue amaryl 8 mg in the morning Continue januvia 100 mg in the morning  Diabetes blood sugar goals  Fasting (in AM before breakfast, 8 hrs of no eating or drinking (except water or unsweetened coffee or tea): 90-110 2 hrs after meals: < 160,   No low sugars: nothing < 70   F/u in 3 months for diabetes, return sooner if needed (persistently low or high sugars)  Dr. Adrian Blackwater

## 2017-02-03 NOTE — Progress Notes (Signed)
Subjective:  [name], male    DOB: [birth year]  Age: 46 y.o. MRN: [id number]  CC: Diabetes   HPI Paul [name] presents for   1. CHRONIC DIABETES dx in 04/2013 after being out of the [facility] for one year. He served for 24 years.  Disease Monitoring  Blood Sugar Ranges:  64-301  Postprandial: not checking   Polyuria: no   Visual problems: no   Medication Compliance: yes metformin 1000 mg BID and amaryl 4 mg daily, januvia 100 mg daily. He has not started lantus.  Medication Side Effects  Hypoglycemia: 64 at 1 AM on 6.25.18  Diet: low carb   Exercise: He walks 2.5 miles, 5 days a week.  He eats grilled chicken salad and no dressing for lunch, he eats oatmeal with cinnamon and 1/2 banana at breakfast. He has lost weight. 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    Social History  Substance Use Topics  . Smoking status: Never Smoker  . Smokeless tobacco: Never Used  . Alcohol use Yes     Comment: Social    Outpatient Medications Prior to Visit  Medication Sig Dispense Refill  . glimepiride (AMARYL) 4 MG tablet Take 2 tablets (8 mg total) by mouth daily before breakfast. 60 tablet 5  . metFORMIN (GLUCOPHAGE) 1000 MG tablet Take 1 tablet (1,000 mg total) by mouth 2 (two) times daily with a meal. 180 tablet 3  . pravastatin (PRAVACHOL) 40 MG tablet Take 1 tablet (40 mg total) by mouth daily. 90 tablet 3  . sitaGLIPtin (JANUVIA) 100 MG tablet Take 1 tablet (100 mg total) by mouth daily. 90 tablet 3  . insulin glargine (LANTUS) 100 UNIT/ML injection Inject 0.1 mLs (10 Units total) into the skin at bedtime. (Patient not taking: Reported on 02/03/2017) 10 mL 2  . Insulin Syringe-Needle U-100 (B-D INS SYRINGE 0.5CC/31GX5/16) 31G X 5/16" 0.5 ML MISC 1 each by Does not apply route at bedtime. (Patient not taking: Reported on 02/03/2017) 30 each 2  . Na Sulfate-K Sulfate-Mg Sulf 17.5-3.13-1.6 GM/180ML SOLN Suprep-Use as  directed (Patient not taking: Reported on 01/19/2017) 354 mL 0  . terbinafine (LAMISIL) 250 MG tablet Take 1 tablet (250 mg total) by mouth daily. For 12 weeks (Patient not taking: Reported on 02/03/2017) 30 tablet 2   Facility-Administered Medications Prior to Visit  Medication Dose Route Frequency Provider Last Rate Last Dose  . 0.9 %  sodium chloride infusion  500 mL Intravenous Continuous Milus Banister, MD        ROS Review of Systems  Constitutional: Negative for chills, fatigue, fever and unexpected weight change.  HENT: Positive for congestion and sinus pressure.   Eyes: Negative for visual disturbance.  Respiratory: Negative for cough and shortness of breath.   Cardiovascular: Negative for chest pain, palpitations and leg swelling.  Gastrointestinal: Negative for abdominal pain, blood in stool, constipation, diarrhea, nausea and vomiting.  Endocrine: Negative for polydipsia, polyphagia and polyuria.  Musculoskeletal: Negative for arthralgias, back pain, gait problem, myalgias and neck pain.  Skin: Negative for rash.  Allergic/Immunologic: Negative for immunocompromised state.  Hematological: Negative for adenopathy. Does not bruise/bleed easily.  Psychiatric/Behavioral: Negative for dysphoric mood, sleep disturbance and suicidal ideas. The patient is not nervous/anxious.     Objective:  BP 109/70   Pulse 79   Temp 97.8 F (36.6 C) (Oral)   Ht 5\' 10"  (1.778 m)   Wt 185 lb 12.8 oz (  84.3 kg)   SpO2 100%   BMI 26.66 kg/m   BP/Weight 02/03/2017 01/19/2017 03/01/4096  Systolic BP 353 299 242  Diastolic BP 70 77 79  Wt. (Lbs) 185.8 191.6 185  BMI 26.66 27.49 26.54   Physical Exam  Constitutional: He appears well-developed and well-nourished. No distress.  HENT:  Head: Normocephalic and atraumatic.  Right Ear: Tympanic membrane, external ear and ear canal normal.  Left Ear: Tympanic membrane and external ear normal.  Nose: Nose normal. No mucosal edema. Right sinus exhibits  no maxillary sinus tenderness and no frontal sinus tenderness. Left sinus exhibits no maxillary sinus tenderness and no frontal sinus tenderness.  Mouth/Throat: Oropharynx is clear and moist and mucous membranes are normal.  Neck: Normal range of motion. Neck supple.  Cardiovascular: Normal rate, regular rhythm, normal heart sounds and intact distal pulses.   Pulmonary/Chest: Effort normal and breath sounds normal.  Musculoskeletal: He exhibits no edema.  Neurological: He is alert.  Skin: Skin is warm and dry. No rash noted. No erythema.  Psychiatric: He has a normal mood and affect.    Lab Results  Component Value Date   HGBA1C 7.2 01/19/2017    CBG 90 Assessment & Plan:   Keniel was seen today for diabetes.  Diagnoses and all orders for this visit:  Uncontrolled type 2 diabetes mellitus without complication, without long-term current use of insulin (HCC) -     POCT glucose (manual entry) -     glimepiride (AMARYL) 4 MG tablet; Take 2 tablets (8 mg total) by mouth daily before breakfast.    No orders of the defined types were placed in this encounter.   Follow-up: Return in about 3 months (around 05/06/2017) for diabetes.   Paul Nearing MD

## 2017-02-05 NOTE — Assessment & Plan Note (Signed)
A: diabetes type 2 with intermittent hyperglycemia. Improving A1c. Patient is very fit and exercises regularly. He decided against starting Lantus. P: Continue Amaryl 8 mg daily, metformin 1000 mg BID, januvia 100 mg daily

## 2017-05-07 ENCOUNTER — Ambulatory Visit: Admitting: Internal Medicine

## 2017-05-14 ENCOUNTER — Encounter: Payer: Self-pay | Admitting: Internal Medicine

## 2017-05-14 ENCOUNTER — Ambulatory Visit: Attending: Internal Medicine | Admitting: Internal Medicine

## 2017-05-14 VITALS — BP 116/66 | HR 69 | Temp 98.2°F | Resp 18 | Ht 70.0 in | Wt 184.2 lb

## 2017-05-14 DIAGNOSIS — Z794 Long term (current) use of insulin: Secondary | ICD-10-CM | POA: Diagnosis not present

## 2017-05-14 DIAGNOSIS — E1165 Type 2 diabetes mellitus with hyperglycemia: Secondary | ICD-10-CM | POA: Insufficient documentation

## 2017-05-14 DIAGNOSIS — E1169 Type 2 diabetes mellitus with other specified complication: Secondary | ICD-10-CM | POA: Diagnosis not present

## 2017-05-14 DIAGNOSIS — G4733 Obstructive sleep apnea (adult) (pediatric): Secondary | ICD-10-CM

## 2017-05-14 DIAGNOSIS — Z23 Encounter for immunization: Secondary | ICD-10-CM | POA: Diagnosis not present

## 2017-05-14 DIAGNOSIS — Z79899 Other long term (current) drug therapy: Secondary | ICD-10-CM | POA: Insufficient documentation

## 2017-05-14 DIAGNOSIS — IMO0001 Reserved for inherently not codable concepts without codable children: Secondary | ICD-10-CM

## 2017-05-14 DIAGNOSIS — Z9989 Dependence on other enabling machines and devices: Secondary | ICD-10-CM

## 2017-05-14 DIAGNOSIS — E785 Hyperlipidemia, unspecified: Secondary | ICD-10-CM

## 2017-05-14 DIAGNOSIS — N529 Male erectile dysfunction, unspecified: Secondary | ICD-10-CM | POA: Insufficient documentation

## 2017-05-14 LAB — POCT GLYCOSYLATED HEMOGLOBIN (HGB A1C): Hemoglobin A1C: 9

## 2017-05-14 LAB — GLUCOSE, POCT (MANUAL RESULT ENTRY): POC Glucose: 225 mg/dl — AB (ref 70–99)

## 2017-05-14 MED ORDER — INSULIN PEN NEEDLE 31G X 6 MM MISC: 6 refills | Status: DC

## 2017-05-14 MED ORDER — INSULIN GLARGINE 100 UNIT/ML SOLOSTAR PEN: 10.0000 [IU] | PEN_INJECTOR | Freq: Every day | SUBCUTANEOUS | 11 refills | Status: DC

## 2017-05-14 NOTE — Progress Notes (Signed)
[name], male    DOB: 04/03/1971  MRN: [id number]  CC: No chief complaint on file.   Subjective: Paul [name] is a 46 y.o. male who presents for chronic ds mangement. Wife, Cyril Mourning, is with him.  Last saw Dr. Adrian Blackwater 12/2016. His concerns today include:  Patient with history of diabetes type 2, HL, OSA on CPAP  1. DM: Reports compliance with medications.  He is on maximum dose of Januvia, metformin, and Amaryl. -Brings logs of blood sugar readings.  His morning blood sugars have been in the 200s. -Does well with eating habits except he eats fast food for dinner.  Both he and his wife are in school and just have not found the time to cook as much as they would like.  Drinks mainly water.  -Exercise: walks daily for 115 mins and does weigh lifting 4 days a week Due for eye exam.  He plans to schedule this himself  2.  OSA: Compliant with CPAP  3.  HL: Tolerating pravastatin  Health maintenance: Due for flu shot, Tdap and eye exam. Patient Active Problem List   Diagnosis Date Noted  . Erectile dysfunction 08/12/2016  . Polyp of colon 08/12/2016  . Obstructive sleep apnea 04/03/2015  . Onychomycosis of right great toe 04/03/2015  . Diabetes type 2, uncontrolled ([place]) 01/25/2014  . Dyslipidemia associated with type 2 diabetes mellitus ([place]) 01/25/2014  . Skin tag 01/25/2014     Current Outpatient Medications on File Prior to Visit  Medication Sig Dispense Refill  . glimepiride (AMARYL) 4 MG tablet Take 2 tablets (8 mg total) by mouth daily before breakfast. 60 tablet 5  . metFORMIN (GLUCOPHAGE) 1000 MG tablet Take 1 tablet (1,000 mg total) by mouth 2 (two) times daily with a meal. 180 tablet 3  . pravastatin (PRAVACHOL) 40 MG tablet Take 1 tablet (40 mg total) by mouth daily. 90 tablet 3  . sitaGLIPtin (JANUVIA) 100 MG tablet Take 1 tablet (100 mg total) by mouth daily. 90 tablet 3  . Insulin Syringe-Needle U-100 (B-D INS SYRINGE 0.5CC/31GX5/16) 31G X  5/16" 0.5 ML MISC 1 each by Does not apply route at bedtime. (Patient not taking: Reported on 02/03/2017) 30 each 2  . terbinafine (LAMISIL) 250 MG tablet Take 1 tablet (250 mg total) by mouth daily. For 12 weeks (Patient not taking: Reported on 02/03/2017) 30 tablet 2   Current Facility-Administered Medications on File Prior to Visit  Medication Dose Route Frequency Provider Last Rate Last Dose  . 0.9 %  sodium chloride infusion  500 mL Intravenous Continuous Milus Banister, MD        Allergies  Allergen Reactions  . Codeine Nausea And Vomiting and Other (See Comments)    headache    Social History   Socioeconomic History  . Marital status: Married    Spouse name: Not on file  . Number of children: 2  . Years of education: Not on file  . Highest education level: Not on file  Social Needs  . Financial resource strain: Not on file  . Food insecurity - worry: Not on file  . Food insecurity - inability: Not on file  . Transportation needs - medical: Not on file  . Transportation needs - non-medical: Not on file  Occupational History  . Occupation: Animal nutritionist  Tobacco Use  . Smoking status: Never Smoker  . Smokeless tobacco: Never Used  Substance and Sexual Activity  . Alcohol use: Yes  Comment: Social  . Drug use: No  . Sexual activity: Not on file  Other Topics Concern  . Not on file  Social History Narrative  . Not on file    Family History  Problem Relation Age of Onset  . Hypertension Father   . Diabetes Mother   . Sleep apnea Other   . Heart attack Maternal Grandfather   . Heart attack Paternal Grandfather   . Cancer Neg Hx   . Colon cancer Neg Hx   . Esophageal cancer Neg Hx   . Stomach cancer Neg Hx   . Rectal cancer Neg Hx     Past Surgical History:  Procedure Laterality Date  . COLONOSCOPY    . KNEE ARTHROSCOPY Right   . UMBILICAL HERNIA REPAIR      ROS: Review of Systems Negative except as stated above  No chest pains or shortness of  breath with exercise  PHYSICAL EXAM: BP 116/66 (BP Location: Left Arm, Patient Position: Sitting, Cuff Size: Normal)   Pulse 69   Temp 98.2 F (36.8 C) (Oral)   Resp 18   Ht 5\' 10"  (1.778 m)   Wt 184 lb 3.2 oz (83.6 kg)   SpO2 98%   BMI 26.43 kg/m    Physical Exam  General appearance - alert, well appearing, and in no distress Mental status - alert, oriented to person, place, and time, normal mood, behavior, speech, dress, motor activity, and thought processes   Results for orders placed or performed in visit on 05/14/17  Glucose (CBG)  Result Value Ref Range   POC Glucose 225 (A) 70 - 99 mg/dl  HgB A1c  Result Value Ref Range   Hemoglobin A1C 9.0      Chemistry      Component Value Date/Time   NA 136 08/12/2016 1704   K 3.9 08/12/2016 1704   CL 99 08/12/2016 1704   CO2 27 08/12/2016 1704   BUN 19 08/12/2016 1704   CREATININE 0.84 08/12/2016 1704      Component Value Date/Time   CALCIUM 9.3 08/12/2016 1704   ALKPHOS 68 08/12/2016 1704   AST 14 08/12/2016 1704   ALT 19 08/12/2016 1704   BILITOT 0.7 08/12/2016 1704     Lab Results  Component Value Date   CHOL 134 01/20/2017   HDL 43 01/20/2017   LDLCALC 77 01/20/2017   TRIG 68 01/20/2017   CHOLHDL 3.1 01/20/2017    ASSESSMENT AND PLAN: 1. Diabetes mellitus type 2, uncontrolled, without complications ([place]) -Not at goal despite his best efforts with medication compliance, exercise and eating habits. We discussed starting him on Lantus insulin.  Patient agreeable to this.  We went over signs and symptoms of hypoglycemia and how to treat should it occur - Glucose (CBG) - HgB A1c - Insulin Glargine (LANTUS SOLOSTAR) 100 UNIT/ML Solostar Pen; Inject 10 Units daily at 10 pm into the skin.  Dispense: 5 pen; Refill: 11 - Insulin Pen Needle 31G X 6 MM MISC; Use as directed  Dispense: 100 each; Refill: 6  2. Dyslipidemia associated with type 2 diabetes mellitus (HCC) Continue pravastatin  3. OSA on CPAP Continue  using CPAP  4. Need for influenza vaccination - Flu Vaccine QUAD 6+ mos PF IM (Fluarix Quad PF)  5. Need for Tdap vaccination - Tdap vaccine greater than or equal to 7yo IM  Patient was given the opportunity to ask questions.  Patient verbalized understanding of the plan and was able to repeat key elements of  the plan.   Orders Placed This Encounter  Procedures  . Glucose (CBG)  . HgB A1c     Requested Prescriptions    No prescriptions requested or ordered in this encounter    Follow-up in 6 weeks to see how he is doing on Lantus Karle Plumber, MD, Rosalita Chessman

## 2017-05-14 NOTE — Patient Instructions (Signed)
Start Lantus 10 units in the evenings. Continue to monitor and record blood sugars.

## 2017-05-14 NOTE — Progress Notes (Signed)
Provided Lantus Solostar education. Patient able to demonstrate use.

## 2017-06-25 ENCOUNTER — Encounter: Payer: Self-pay | Admitting: Internal Medicine

## 2017-06-25 ENCOUNTER — Ambulatory Visit: Attending: Internal Medicine | Admitting: Internal Medicine

## 2017-06-25 VITALS — BP 112/70 | HR 69 | Temp 98.2°F | Resp 16 | Wt 188.6 lb

## 2017-06-25 DIAGNOSIS — Z794 Long term (current) use of insulin: Secondary | ICD-10-CM | POA: Diagnosis not present

## 2017-06-25 DIAGNOSIS — Z79899 Other long term (current) drug therapy: Secondary | ICD-10-CM | POA: Insufficient documentation

## 2017-06-25 DIAGNOSIS — N529 Male erectile dysfunction, unspecified: Secondary | ICD-10-CM | POA: Insufficient documentation

## 2017-06-25 DIAGNOSIS — G4733 Obstructive sleep apnea (adult) (pediatric): Secondary | ICD-10-CM | POA: Insufficient documentation

## 2017-06-25 DIAGNOSIS — B351 Tinea unguium: Secondary | ICD-10-CM | POA: Insufficient documentation

## 2017-06-25 DIAGNOSIS — E119 Type 2 diabetes mellitus without complications: Secondary | ICD-10-CM | POA: Diagnosis not present

## 2017-06-25 DIAGNOSIS — E1169 Type 2 diabetes mellitus with other specified complication: Secondary | ICD-10-CM

## 2017-06-25 DIAGNOSIS — E785 Hyperlipidemia, unspecified: Secondary | ICD-10-CM | POA: Insufficient documentation

## 2017-06-25 DIAGNOSIS — L600 Ingrowing nail: Secondary | ICD-10-CM | POA: Diagnosis not present

## 2017-06-25 LAB — GLUCOSE, POCT (MANUAL RESULT ENTRY): POC GLUCOSE: 152 mg/dL — AB (ref 70–99)

## 2017-06-25 NOTE — Progress Notes (Signed)
[name], male    DOB: May 08, 1971  MRN: [id number]  CC: Follow-up   Subjective: Paul [name] is a 46 y.o. male who presents for 6 wks f/u DM His concerns today include: Patient with history of diabetes type 2, HL, OSA on CPAP  DM: Lantus 10 units at bedtime added to drug regimen on last visit.  He has been taking consistently. -checking BS in a.m several times a wk. gives a range below 130 most times. Low 87 one time Eating habits: cheated a little due to holidays but overall does well.  2. Damage nailbed on RT big toe several yrs ago. Nail almost came off -recenttly tried to trim the nail. Some pus expressed -nail started to dig into flesh proximal lateral corner  Has Lamisil which was given to him earlier this yr for fungal nail infection of this same toe. Never used it  Patient Active Problem List   Diagnosis Date Noted  . Erectile dysfunction 08/12/2016  . Polyp of colon 08/12/2016  . Obstructive sleep apnea 04/03/2015  . Onychomycosis of right great toe 04/03/2015  . Diabetes type 2, uncontrolled ([place]) 01/25/2014  . Dyslipidemia associated with type 2 diabetes mellitus ([place]) 01/25/2014  . Skin tag 01/25/2014     Current Outpatient Medications on File Prior to Visit  Medication Sig Dispense Refill  . glimepiride (AMARYL) 4 MG tablet Take 2 tablets (8 mg total) by mouth daily before breakfast. 60 tablet 5  . Insulin Glargine (LANTUS SOLOSTAR) 100 UNIT/ML Solostar Pen Inject 10 Units daily at 10 pm into the skin. 5 pen 11  . Insulin Pen Needle 31G X 6 MM MISC Use as directed 100 each 6  . Insulin Syringe-Needle U-100 (B-D INS SYRINGE 0.5CC/31GX5/16) 31G X 5/16" 0.5 ML MISC 1 each by Does not apply route at bedtime. (Patient not taking: Reported on 02/03/2017) 30 each 2  . metFORMIN (GLUCOPHAGE) 1000 MG tablet Take 1 tablet (1,000 mg total) by mouth 2 (two) times daily with a meal. 180 tablet 3  . pravastatin (PRAVACHOL) 40 MG tablet Take 1 tablet  (40 mg total) by mouth daily. 90 tablet 3  . sitaGLIPtin (JANUVIA) 100 MG tablet Take 1 tablet (100 mg total) by mouth daily. 90 tablet 3  . terbinafine (LAMISIL) 250 MG tablet Take 1 tablet (250 mg total) by mouth daily. For 12 weeks (Patient not taking: Reported on 02/03/2017) 30 tablet 2   Current Facility-Administered Medications on File Prior to Visit  Medication Dose Route Frequency Provider Last Rate Last Dose  . 0.9 %  sodium chloride infusion  500 mL Intravenous Continuous Milus Banister, MD        Allergies  Allergen Reactions  . Codeine Nausea And Vomiting and Other (See Comments)    headache    Social History   Socioeconomic History  . Marital status: Married    Spouse name: Not on file  . Number of children: 2  . Years of education: Not on file  . Highest education level: Not on file  Social Needs  . Financial resource strain: Not on file  . Food insecurity - worry: Not on file  . Food insecurity - inability: Not on file  . Transportation needs - medical: Not on file  . Transportation needs - non-medical: Not on file  Occupational History  . Occupation: Animal nutritionist  Tobacco Use  . Smoking status: Never Smoker  . Smokeless tobacco: Never Used  Substance and Sexual Activity  .  Alcohol use: Yes    Comment: Social  . Drug use: No  . Sexual activity: Not on file  Other Topics Concern  . Not on file  Social History Narrative  . Not on file    Family History  Problem Relation Age of Onset  . Hypertension Father   . Diabetes Mother   . Sleep apnea Other   . Heart attack Maternal Grandfather   . Heart attack Paternal Grandfather   . Cancer Neg Hx   . Colon cancer Neg Hx   . Esophageal cancer Neg Hx   . Stomach cancer Neg Hx   . Rectal cancer Neg Hx     Past Surgical History:  Procedure Laterality Date  . COLONOSCOPY    . KNEE ARTHROSCOPY Right   . UMBILICAL HERNIA REPAIR      ROS: Review of Systems Negative except as stated above PHYSICAL  EXAM: BP 112/70   Pulse 69   Temp 98.2 F (36.8 C) (Oral)   Resp 16   Wt 188 lb 9.6 oz (85.5 kg)   SpO2 98%   BMI 27.06 kg/m   Physical Exam General appearance - alert, well appearing, and in no distress Mental status - alert, oriented to person, place, and time, normal mood, behavior, speech, dress, motor activity, and thought processes Skin - RT big toe: nail has receded, is discolored and only covers about 1/2 of the nail bed. No erythema or pus expressed.  The nail is ingrown and pushes through the skin proximal lateral edge.  Results for orders placed or performed in visit on 06/25/17  POCT glucose (manual entry)  Result Value Ref Range   POC Glucose 152 (A) 70 - 99 mg/dl   ASSESSMENT AND PLAN: 1. Controlled type 2 diabetes mellitus without complication, without long-term current use of insulin (HCC) Blood sugars are much better.  He will continue current dose of Lantus, glimepiride, metformin and Januvia - POCT glucose (manual entry)  2. Ingrowing nail, right great toe - Ambulatory referral to Podiatry  3. Onychomycosis Patient will hold off on taking Lamisil.  It looks like he will best be served with having this toenail removed completely. - Ambulatory referral to Podiatry  Patient was given the opportunity to ask questions.  Patient verbalized understanding of the plan and was able to repeat key elements of the plan.   Orders Placed This Encounter  Procedures  . Ambulatory referral to Podiatry  . POCT glucose (manual entry)     Requested Prescriptions    No prescriptions requested or ordered in this encounter    Return in about 4 months (around 10/24/2017).  Karle Plumber, MD, FACP

## 2017-07-30 ENCOUNTER — Telehealth: Payer: Self-pay | Admitting: Internal Medicine

## 2017-07-30 DIAGNOSIS — E1169 Type 2 diabetes mellitus with other specified complication: Secondary | ICD-10-CM

## 2017-07-30 DIAGNOSIS — IMO0001 Reserved for inherently not codable concepts without codable children: Secondary | ICD-10-CM

## 2017-07-30 DIAGNOSIS — E1165 Type 2 diabetes mellitus with hyperglycemia: Principal | ICD-10-CM

## 2017-07-30 DIAGNOSIS — E785 Hyperlipidemia, unspecified: Secondary | ICD-10-CM

## 2017-07-30 MED ORDER — METFORMIN HCL 1000 MG PO TABS: 1000.0000 mg | ORAL_TABLET | Freq: Two times a day (BID) | ORAL | 0 refills | Status: DC

## 2017-07-30 MED ORDER — GLIMEPIRIDE 4 MG PO TABS: 8.0000 mg | ORAL_TABLET | Freq: Every day | ORAL | 0 refills | Status: DC

## 2017-07-30 MED ORDER — SITAGLIPTIN PHOSPHATE 100 MG PO TABS: 100.0000 mg | ORAL_TABLET | Freq: Every day | ORAL | 0 refills | Status: DC

## 2017-07-30 MED ORDER — PRAVASTATIN SODIUM 40 MG PO TABS: 40.0000 mg | ORAL_TABLET | Freq: Every day | ORAL | 0 refills | Status: DC

## 2017-07-30 MED ORDER — INSULIN PEN NEEDLE 31G X 6 MM MISC: 0 refills | Status: DC

## 2017-07-30 MED ORDER — INSULIN GLARGINE 100 UNIT/ML SOLOSTAR PEN: 10.0000 [IU] | PEN_INJECTOR | Freq: Every day | SUBCUTANEOUS | 0 refills | Status: DC

## 2017-07-30 NOTE — Telephone Encounter (Signed)
Pt. Called stating that he now gets all his medications [facility]. Pt. Would like his Rx sent to the company.  Please f/u with pt.   Phone: [phone]  [phone]

## 2017-07-30 NOTE — Telephone Encounter (Signed)
Current meds sent to [facility]

## 2017-10-19 ENCOUNTER — Encounter: Payer: Self-pay | Admitting: Internal Medicine

## 2017-10-19 ENCOUNTER — Ambulatory Visit: Attending: Internal Medicine | Admitting: Internal Medicine

## 2017-10-19 VITALS — BP 124/80 | HR 69 | Temp 98.6°F | Resp 16 | Wt 191.4 lb

## 2017-10-19 DIAGNOSIS — M545 Low back pain, unspecified: Secondary | ICD-10-CM

## 2017-10-19 DIAGNOSIS — Z794 Long term (current) use of insulin: Secondary | ICD-10-CM | POA: Insufficient documentation

## 2017-10-19 DIAGNOSIS — Z833 Family history of diabetes mellitus: Secondary | ICD-10-CM | POA: Insufficient documentation

## 2017-10-19 DIAGNOSIS — E785 Hyperlipidemia, unspecified: Secondary | ICD-10-CM | POA: Diagnosis not present

## 2017-10-19 DIAGNOSIS — E1165 Type 2 diabetes mellitus with hyperglycemia: Secondary | ICD-10-CM

## 2017-10-19 DIAGNOSIS — Z79899 Other long term (current) drug therapy: Secondary | ICD-10-CM | POA: Insufficient documentation

## 2017-10-19 DIAGNOSIS — Z885 Allergy status to narcotic agent status: Secondary | ICD-10-CM | POA: Insufficient documentation

## 2017-10-19 DIAGNOSIS — E1169 Type 2 diabetes mellitus with other specified complication: Secondary | ICD-10-CM | POA: Diagnosis not present

## 2017-10-19 DIAGNOSIS — Z8249 Family history of ischemic heart disease and other diseases of the circulatory system: Secondary | ICD-10-CM | POA: Diagnosis not present

## 2017-10-19 DIAGNOSIS — IMO0001 Reserved for inherently not codable concepts without codable children: Secondary | ICD-10-CM

## 2017-10-19 DIAGNOSIS — E119 Type 2 diabetes mellitus without complications: Secondary | ICD-10-CM | POA: Insufficient documentation

## 2017-10-19 LAB — POCT GLYCOSYLATED HEMOGLOBIN (HGB A1C): HEMOGLOBIN A1C: 10.2

## 2017-10-19 LAB — GLUCOSE, POCT (MANUAL RESULT ENTRY): POC GLUCOSE: 292 mg/dL — AB (ref 70–99)

## 2017-10-19 MED ORDER — GLIMEPIRIDE 4 MG PO TABS: 8.0000 mg | ORAL_TABLET | Freq: Every day | ORAL | 3 refills | Status: DC

## 2017-10-19 MED ORDER — INSULIN GLARGINE 100 UNIT/ML SOLOSTAR PEN: PEN_INJECTOR | SUBCUTANEOUS | 6 refills | Status: DC

## 2017-10-19 MED ORDER — SITAGLIPTIN PHOSPHATE 100 MG PO TABS: 100.0000 mg | ORAL_TABLET | Freq: Every day | ORAL | 3 refills | Status: DC

## 2017-10-19 MED ORDER — METFORMIN HCL 1000 MG PO TABS: 1000.0000 mg | ORAL_TABLET | Freq: Two times a day (BID) | ORAL | 3 refills | Status: DC

## 2017-10-19 MED ORDER — PRAVASTATIN SODIUM 40 MG PO TABS: 40.0000 mg | ORAL_TABLET | Freq: Every day | ORAL | 3 refills | Status: DC

## 2017-10-19 NOTE — Progress Notes (Signed)
Patient ID: DEMONE [name], male    DOB: 13-Dec-1970  MRN: [id number]  CC: Diabetes   Subjective: Paul [name] is a 47 y.o. male who presents for chronic disease management.  Wife is with him. His concerns today include:   DM  Not checking BS regularly as he should.  No low BS episodes. Meds: compliant with meds which include Januvia, metformin, Lantus and glimepiride Diet: Doing well with eating habits.   Exercise:  Not walking as much because he is now in school 5 days a week.  He used to walk twice a day now he is down to once a day  He is wondering whether he is a type I or type II diabetic.  He was diagnosed with diabetes 4 years ago.  He has family history of diabetes in his mother. Due for eye exam.  Plans to schedule this on his own  LBP:  Back little sore at the end of the day.  Rates 5 or below.  He is concerned that it may be his kidneys.  No aggravating factors.  No radiation of pain down the legs.  No numbness or tingling in the legs.  No dysuria or hematuria.  HL: Compliant with pravastatin.  Patient Active Problem List   Diagnosis Date Noted  . Erectile dysfunction 08/12/2016  . Polyp of colon 08/12/2016  . Obstructive sleep apnea 04/03/2015  . Onychomycosis of right great toe 04/03/2015  . Diabetes type 2, uncontrolled ([place]) 01/25/2014  . Dyslipidemia associated with type 2 diabetes mellitus ([place]) 01/25/2014  . Skin tag 01/25/2014     Current Outpatient Medications on File Prior to Visit  Medication Sig Dispense Refill  . Insulin Pen Needle 31G X 6 MM MISC Use as directed 100 each 0  . Insulin Syringe-Needle U-100 (B-D INS SYRINGE 0.5CC/31GX5/16) 31G X 5/16" 0.5 ML MISC 1 each by Does not apply route at bedtime. (Patient not taking: Reported on 02/03/2017) 30 each 2   No current facility-administered medications on file prior to visit.     Allergies  Allergen Reactions  . Codeine Nausea And Vomiting and Other (See Comments)    headache    Social  History   Socioeconomic History  . Marital status: Married    Spouse name: Not on file  . Number of children: 2  . Years of education: Not on file  . Highest education level: Not on file  Occupational History  . Occupation: Animal nutritionist  Social Needs  . Financial resource strain: Not on file  . Food insecurity:    Worry: Not on file    Inability: Not on file  . Transportation needs:    Medical: Not on file    Non-medical: Not on file  Tobacco Use  . Smoking status: Never Smoker  . Smokeless tobacco: Never Used  Substance and Sexual Activity  . Alcohol use: Yes    Comment: Social  . Drug use: No  . Sexual activity: Not on file  Lifestyle  . Physical activity:    Days per week: Not on file    Minutes per session: Not on file  . Stress: Not on file  Relationships  . Social connections:    Talks on phone: Not on file    Gets together: Not on file    Attends religious service: Not on file    Active member of club or organization: Not on file    Attends meetings of clubs or organizations: Not on file  Relationship status: Not on file  . Intimate partner violence:    Fear of current or ex partner: Not on file    Emotionally abused: Not on file    Physically abused: Not on file    Forced sexual activity: Not on file  Other Topics Concern  . Not on file  Social History Narrative  . Not on file    Family History  Problem Relation Age of Onset  . Hypertension Father   . Diabetes Mother   . Sleep apnea Other   . Heart attack Maternal Grandfather   . Heart attack Paternal Grandfather   . Cancer Neg Hx   . Colon cancer Neg Hx   . Esophageal cancer Neg Hx   . Stomach cancer Neg Hx   . Rectal cancer Neg Hx     Past Surgical History:  Procedure Laterality Date  . COLONOSCOPY    . KNEE ARTHROSCOPY Right   . UMBILICAL HERNIA REPAIR      ROS: Review of Systems Negative except as stated above. PHYSICAL EXAM: BP 124/80   Pulse 69   Temp 98.6 F (37 C)  (Oral)   Resp 16   Wt 191 lb 6.4 oz (86.8 kg)   SpO2 95%   BMI 27.46 kg/m   Physical Exam  General appearance - alert, well appearing, and in no distress Mental status - alert, oriented to person, place, and time, normal mood, behavior, speech, dress, motor activity, and thought processes Neck - supple, no significant adenopathy Chest - clear to auscultation, no wheezes, rales or rhonchi, symmetric air entry Heart - normal rate, regular rhythm, normal S1, S2, no murmurs, rubs, clicks or gallops Abdomen: Soft and nontender.  No organomegaly. Musculoskeletal -tenderness on palpation of the flank and lumbosacral muscles. Extremities -lower extremity edema. Diabetic Foot Exam - Simple   Simple Foot Form Visual Inspection No deformities, no ulcerations, no other skin breakdown bilaterally:  Yes Sensation Testing Intact to touch and monofilament testing bilaterally:  Yes Pulse Check Posterior Tibialis and Dorsalis pulse intact bilaterally:  Yes Comments     A1C 10.2/BS 292  ASSESSMENT AND PLAN: 1. Uncontrolled type 2 diabetes mellitus without complication, without long-term current use of insulin ([place]) -Patient most likely diabetes type 2 given his family history and being diagnosed well into his adulthood.  He is not had any episodes of DKA.  I told him that to be sure we can always check gad antibodies but patient was undecided on this. -Recommend increase Lantus from 10 units to 14 units daily.  Advised that he check blood sugars every morning and titrate every 3 days in 3 unit increments until morning blood sugars are consistently below 130.  Maximum dose of Lantus to go to before calling in is 22 units Continue regular exercise and healthy eating habits - POCT glucose (manual entry) - POCT glycosylated hemoglobin (Hb A1C) - Microalbumin / creatinine urine ratio - metFORMIN (GLUCOPHAGE) 1000 MG tablet; Take 1 tablet (1,000 mg total) by mouth 2 (two) times daily with a meal.   Dispense: 180 tablet; Refill: 3 - glimepiride (AMARYL) 4 MG tablet; Take 2 tablets (8 mg total) by mouth daily before breakfast.  Dispense: 180 tablet; Refill: 3 - sitaGLIPtin (JANUVIA) 100 MG tablet; Take 1 tablet (100 mg total) by mouth daily.  Dispense: 90 tablet; Refill: 3 - Comprehensive metabolic panel - CBC - Lipid panel - Urinalysis - Insulin Glargine (LANTUS SOLOSTAR) 100 UNIT/ML Solostar Pen; Lantus 14 units subcut daily.  Increase  in 3 unit increments every 3 days until morning blood sugars consistently less than 130 or you reach 22 units daily.  Dispense: 5 pen; Refill: 6  2. Dyslipidemia associated with type 2 diabetes mellitus (HCC) - pravastatin (PRAVACHOL) 40 MG tablet; Take 1 tablet (40 mg total) by mouth daily.  Dispense: 90 tablet; Refill: 3  3. Acute bilateral low back pain without sciatica Doubt this is his kidney.  Likely MSK in nature. Medications that he is on that can cause musculoskeletal pain include Pravachol and Januvia but we have decided to continue these meds for now.   Patient was given the opportunity to ask questions.  Patient verbalized understanding of the plan and was able to repeat key elements of the plan.   Orders Placed This Encounter  Procedures  . Microalbumin / creatinine urine ratio  . Comprehensive metabolic panel  . CBC  . Lipid panel  . Urinalysis  . POCT glucose (manual entry)  . POCT glycosylated hemoglobin (Hb A1C)     Requested Prescriptions   Signed Prescriptions Disp Refills  . metFORMIN (GLUCOPHAGE) 1000 MG tablet 180 tablet 3    Sig: Take 1 tablet (1,000 mg total) by mouth 2 (two) times daily with a meal.  . pravastatin (PRAVACHOL) 40 MG tablet 90 tablet 3    Sig: Take 1 tablet (40 mg total) by mouth daily.  Marland Kitchen glimepiride (AMARYL) 4 MG tablet 180 tablet 3    Sig: Take 2 tablets (8 mg total) by mouth daily before breakfast.  . sitaGLIPtin (JANUVIA) 100 MG tablet 90 tablet 3    Sig: Take 1 tablet (100 mg total) by mouth  daily.  . Insulin Glargine (LANTUS SOLOSTAR) 100 UNIT/ML Solostar Pen 5 pen 6    Sig: Lantus 14 units subcut daily.  Increase in 3 unit increments every 3 days until morning blood sugars consistently less than 130 or you reach 22 units daily.    Return in about 2 months (around 12/19/2017).  Karle Plumber, MD, FACP

## 2017-10-19 NOTE — Patient Instructions (Signed)
Check your blood sugar every morning before breakfast.  Increase Lantus to 14 units daily.  Increase Lantus insulin by 3 units every three days until morning blood sugars are consistently less than 130 OR you reach 22 units daily.  If you get to 22 units and your morning blood sugars are still running higher than 130 consistently, please send me a Mychart message.

## 2017-10-20 LAB — CBC
HEMATOCRIT: 42.4 % (ref 37.5–51.0)
Hemoglobin: 14.5 g/dL (ref 13.0–17.7)
MCH: 30.1 pg (ref 26.6–33.0)
MCHC: 34.2 g/dL (ref 31.5–35.7)
MCV: 88 fL (ref 79–97)
Platelets: 198 10*3/uL (ref 150–379)
RBC: 4.81 x10E6/uL (ref 4.14–5.80)
RDW: 13 % (ref 12.3–15.4)
WBC: 8.7 10*3/uL (ref 3.4–10.8)

## 2017-10-20 LAB — COMPREHENSIVE METABOLIC PANEL
ALBUMIN: 4.3 g/dL (ref 3.5–5.5)
ALT: 16 IU/L (ref 0–44)
AST: 12 IU/L (ref 0–40)
Albumin/Globulin Ratio: 1.8 (ref 1.2–2.2)
Alkaline Phosphatase: 73 IU/L (ref 39–117)
BUN / CREAT RATIO: 17 (ref 9–20)
BUN: 15 mg/dL (ref 6–24)
Bilirubin Total: 0.6 mg/dL (ref 0.0–1.2)
CALCIUM: 9.6 mg/dL (ref 8.7–10.2)
CO2: 24 mmol/L (ref 20–29)
CREATININE: 0.87 mg/dL (ref 0.76–1.27)
Chloride: 97 mmol/L (ref 96–106)
GFR calc non Af Amer: 102 mL/min/{1.73_m2} (ref >59)
GFR, EST AFRICAN AMERICAN: 118 mL/min/{1.73_m2} (ref >59)
Globulin, Total: 2.4 g/dL (ref 1.5–4.5)
Glucose: 295 mg/dL — ABNORMAL HIGH (ref 65–99)
Potassium: 4.4 mmol/L (ref 3.5–5.2)
Sodium: 138 mmol/L (ref 134–144)
TOTAL PROTEIN: 6.7 g/dL (ref 6.0–8.5)

## 2017-10-20 LAB — URINALYSIS
Bilirubin, UA: NEGATIVE
KETONES UA: NEGATIVE
LEUKOCYTES UA: NEGATIVE
NITRITE UA: NEGATIVE
Protein, UA: NEGATIVE
RBC UA: NEGATIVE
SPEC GRAV UA: 1.025 (ref 1.005–1.030)
Urobilinogen, Ur: 0.2 mg/dL (ref 0.2–1.0)
pH, UA: 5.5 (ref 5.0–7.5)

## 2017-10-20 LAB — LIPID PANEL
CHOL/HDL RATIO: 3.9 ratio (ref 0.0–5.0)
Cholesterol, Total: 131 mg/dL (ref 100–199)
HDL: 34 mg/dL — AB (ref >39)
LDL Calculated: 64 mg/dL (ref 0–99)
Triglycerides: 166 mg/dL — ABNORMAL HIGH (ref 0–149)
VLDL Cholesterol Cal: 33 mg/dL (ref 5–40)

## 2017-10-20 LAB — MICROALBUMIN / CREATININE URINE RATIO: Creatinine, Urine: 54.5 mg/dL

## 2017-10-21 ENCOUNTER — Telehealth: Payer: Self-pay

## 2017-10-21 NOTE — Telephone Encounter (Signed)
Contacted pt to go over lab results pt didn't answer lvm asking pt to give me a call at his earliest convenience  

## 2017-10-21 NOTE — Telephone Encounter (Signed)
Pt returned call to receive lab results pt is aware and doesn't have any questions or concerns

## 2017-12-21 ENCOUNTER — Encounter: Payer: Self-pay | Admitting: Internal Medicine

## 2017-12-21 ENCOUNTER — Ambulatory Visit: Attending: Internal Medicine | Admitting: Internal Medicine

## 2017-12-21 VITALS — BP 118/75 | HR 63 | Temp 97.6°F | Resp 16 | Wt 194.2 lb

## 2017-12-21 DIAGNOSIS — E785 Hyperlipidemia, unspecified: Secondary | ICD-10-CM | POA: Insufficient documentation

## 2017-12-21 DIAGNOSIS — N529 Male erectile dysfunction, unspecified: Secondary | ICD-10-CM | POA: Diagnosis not present

## 2017-12-21 DIAGNOSIS — Z79899 Other long term (current) drug therapy: Secondary | ICD-10-CM | POA: Diagnosis not present

## 2017-12-21 DIAGNOSIS — Z794 Long term (current) use of insulin: Secondary | ICD-10-CM | POA: Insufficient documentation

## 2017-12-21 DIAGNOSIS — M545 Low back pain, unspecified: Secondary | ICD-10-CM

## 2017-12-21 DIAGNOSIS — IMO0001 Reserved for inherently not codable concepts without codable children: Secondary | ICD-10-CM

## 2017-12-21 DIAGNOSIS — L84 Corns and callosities: Secondary | ICD-10-CM | POA: Diagnosis not present

## 2017-12-21 DIAGNOSIS — G4733 Obstructive sleep apnea (adult) (pediatric): Secondary | ICD-10-CM | POA: Insufficient documentation

## 2017-12-21 DIAGNOSIS — E119 Type 2 diabetes mellitus without complications: Secondary | ICD-10-CM | POA: Insufficient documentation

## 2017-12-21 DIAGNOSIS — E1165 Type 2 diabetes mellitus with hyperglycemia: Secondary | ICD-10-CM

## 2017-12-21 DIAGNOSIS — G8929 Other chronic pain: Secondary | ICD-10-CM

## 2017-12-21 LAB — GLUCOSE, POCT (MANUAL RESULT ENTRY): POC Glucose: 129 mg/dl — AB (ref 70–99)

## 2017-12-21 MED ORDER — INSULIN GLARGINE 100 UNIT/ML SOLOSTAR PEN: PEN_INJECTOR | SUBCUTANEOUS | 6 refills | Status: DC

## 2017-12-21 NOTE — Patient Instructions (Addendum)
Increase Lantus to 25 units daily.  Continue to titrate by 2 units every 3 days until morning blood sugars are consistently between 90-140.   Do not for get to call and schedule a diabetic eye exam.

## 2017-12-21 NOTE — Progress Notes (Signed)
[name], male    DOB: 12-26-1970  MRN: [id number]  CC: Diabetes   Subjective: Paul [name] is a 47 y.o. male who presents for chronic ds management. Wife is with him. His concerns today include:  Hx of DM, HL,   DM: He brings a log of blood sugar readings with him.  For the past 3 to 4 weeks he has been checking once a day in the mornings.  A.m. blood sugars have been in the 200 range except for the last 2 readings done 3 days ago and today which were in the mid 100 range He is on Lantus 22 units daily Exercise: walking 2 x a wk and lift wghs 4 days a wk Eating habits:  "If I'm strick about my food and portion size, my blood sugars are good." Sleeping only 5.5-6 hrs at nights.  Wife wonders if he is getting adequate sleep and whether lack of adequate sleep can also contribute to increased blood sugars Vision a little blurry at times.  He has not scheduled eye exam as yet No numbness in feet but does have callus at the ball of both fifth toes  Soreness in lower back usually at end of day.  This is unchanged from last visit. Does wgh lifting several times a week including bench pressing.  He does not do any overhead weight lifting.he does try to do stretches after he is done with his weightlifting sessions .  Patient Active Problem List   Diagnosis Date Noted  . Erectile dysfunction 08/12/2016  . Polyp of colon 08/12/2016  . Obstructive sleep apnea 04/03/2015  . Onychomycosis of right great toe 04/03/2015  . Diabetes type 2, uncontrolled ([place]) 01/25/2014  . Dyslipidemia associated with type 2 diabetes mellitus ([place]) 01/25/2014  . Skin tag 01/25/2014     Current Outpatient Medications on File Prior to Visit  Medication Sig Dispense Refill  . glimepiride (AMARYL) 4 MG tablet Take 2 tablets (8 mg total) by mouth daily before breakfast. 180 tablet 3  . Insulin Pen Needle 31G X 6 MM MISC Use as directed 100 each 0  . Insulin Syringe-Needle U-100 (B-D INS  SYRINGE 0.5CC/31GX5/16) 31G X 5/16" 0.5 ML MISC 1 each by Does not apply route at bedtime. (Patient not taking: Reported on 02/03/2017) 30 each 2  . metFORMIN (GLUCOPHAGE) 1000 MG tablet Take 1 tablet (1,000 mg total) by mouth 2 (two) times daily with a meal. 180 tablet 3  . pravastatin (PRAVACHOL) 40 MG tablet Take 1 tablet (40 mg total) by mouth daily. 90 tablet 3  . sitaGLIPtin (JANUVIA) 100 MG tablet Take 1 tablet (100 mg total) by mouth daily. 90 tablet 3   No current facility-administered medications on file prior to visit.     Allergies  Allergen Reactions  . Codeine Nausea And Vomiting and Other (See Comments)    headache    Social History   Socioeconomic History  . Marital status: Married    Spouse name: Not on file  . Number of children: 2  . Years of education: Not on file  . Highest education level: Not on file  Occupational History  . Occupation: Animal nutritionist  Social Needs  . Financial resource strain: Not on file  . Food insecurity:    Worry: Not on file    Inability: Not on file  . Transportation needs:    Medical: Not on file    Non-medical: Not on file  Tobacco Use  .  Smoking status: Never Smoker  . Smokeless tobacco: Never Used  Substance and Sexual Activity  . Alcohol use: Yes    Comment: Social  . Drug use: No  . Sexual activity: Not on file  Lifestyle  . Physical activity:    Days per week: Not on file    Minutes per session: Not on file  . Stress: Not on file  Relationships  . Social connections:    Talks on phone: Not on file    Gets together: Not on file    Attends religious service: Not on file    Active member of club or organization: Not on file    Attends meetings of clubs or organizations: Not on file    Relationship status: Not on file  . Intimate partner violence:    Fear of current or ex partner: Not on file    Emotionally abused: Not on file    Physically abused: Not on file    Forced sexual activity: Not on file  Other  Topics Concern  . Not on file  Social History Narrative  . Not on file    Family History  Problem Relation Age of Onset  . Hypertension Father   . Diabetes Mother   . Sleep apnea Other   . Heart attack Maternal Grandfather   . Heart attack Paternal Grandfather   . Cancer Neg Hx   . Colon cancer Neg Hx   . Esophageal cancer Neg Hx   . Stomach cancer Neg Hx   . Rectal cancer Neg Hx     Past Surgical History:  Procedure Laterality Date  . COLONOSCOPY    . KNEE ARTHROSCOPY Right   . UMBILICAL HERNIA REPAIR      ROS: Review of Systems Negative except as stated above  PHYSICAL EXAM: BP 118/75   Pulse 63   Temp 97.6 F (36.4 C) (Oral)   Resp 16   Wt 194 lb 3.2 oz (88.1 kg)   SpO2 98%   BMI 27.86 kg/m   Physical Exam   General appearance - alert, well appearing, and in no distress Mental status - alert, oriented to person, place, and time, normal mood, behavior, speech, dress, motor activity, and thought processes Neck - supple, no significant adenopathy Chest - clear to auscultation, no wheezes, rales or rhonchi, symmetric air entry Heart - normal rate, regular rhythm, normal S1, S2, no murmurs, rubs, clicks or gallops Musculoskeletal -no tenderness on palpation of lumbar spine.   Extremities - peripheral pulses normal, no pedal edema, no clubbing or cyanosis Feet: Good dorsalis pedis and posterior tibialis pulses.  Small callous on ball 5th MT BL Results for orders placed or performed in visit on 12/21/17  POCT glucose (manual entry)  Result Value Ref Range   POC Glucose 129 (A) 70 - 99 mg/dl   Lab Results  Component Value Date   HGBA1C 10.2 10/19/2017   Lab Results  Component Value Date   CHOL 131 10/19/2017   HDL 34 (L) 10/19/2017   LDLCALC 64 10/19/2017   TRIG 166 (H) 10/19/2017   CHOLHDL 3.9 10/19/2017     Chemistry      Component Value Date/Time   NA 138 10/19/2017 1610   K 4.4 10/19/2017 1610   CL 97 10/19/2017 1610   CO2 24 10/19/2017 1610     BUN 15 10/19/2017 1610   CREATININE 0.87 10/19/2017 1610   CREATININE 0.84 08/12/2016 1704      Component Value Date/Time   CALCIUM  9.6 10/19/2017 1610   ALKPHOS 73 10/19/2017 1610   AST 12 10/19/2017 1610   ALT 16 10/19/2017 1610   BILITOT 0.6 10/19/2017 1610      ASSESSMENT AND PLAN: 1. Diabetes mellitus type 2, uncontrolled, without complications ([place]) Encourage him to continue healthy eating habits.  Increase Lantus to 25 units daily.  Titrate by 2 units every 3 days until morning blood sugars consistently between 90-140 Encouraged to get about 8 hours of sleep at nights - POCT glucose (manual entry) - Insulin Glargine (LANTUS SOLOSTAR) 100 UNIT/ML Solostar Pen; Lantus 25 units subcut daily.  Increase in 2 unit increments every 3 days until morning blood sugars consistently between 90-130.  Dispense: 5 pen; Refill: 6  2. Pre-ulcerative corn or callous Patient declines referral to podiatry at this time.  He tries to shave them himself using something similar to an emory board/stone. I advise  Also using Dr. Felicie Morn circular pad to help depress pressure on the area  3. Chronic midline low back pain without sciatica -This has not progressed.  Again I think this is musculoskeletal in nature.  Advised care with weight lifting.  He is on Pravachol which can cause muscle cramps.  I doubt this is causing the soreness in his lower back.  Januvia can sometimes cause joint pains.  Patient declines a muscle relaxant to use as needed at this time  Patient was given the opportunity to ask questions.  Patient verbalized understanding of the plan and was able to repeat key elements of the plan.   Orders Placed This Encounter  Procedures  . POCT glucose (manual entry)     Requested Prescriptions   Signed Prescriptions Disp Refills  . Insulin Glargine (LANTUS SOLOSTAR) 100 UNIT/ML Solostar Pen 5 pen 6    Sig: Lantus 25 units subcut daily.  Increase in 2 unit increments every 3 days until  morning blood sugars consistently between 90-130.    Return in about 3 months (around 03/23/2018).  Karle Plumber, MD, FACP

## 2018-03-22 ENCOUNTER — Ambulatory Visit: Admitting: Internal Medicine

## 2018-04-06 ENCOUNTER — Ambulatory Visit: Attending: Internal Medicine | Admitting: Internal Medicine

## 2018-04-06 ENCOUNTER — Encounter: Payer: Self-pay | Admitting: Internal Medicine

## 2018-04-06 VITALS — BP 120/78 | HR 71 | Temp 98.2°F | Resp 16 | Wt 195.6 lb

## 2018-04-06 DIAGNOSIS — E1169 Type 2 diabetes mellitus with other specified complication: Secondary | ICD-10-CM | POA: Diagnosis not present

## 2018-04-06 DIAGNOSIS — Z79899 Other long term (current) drug therapy: Secondary | ICD-10-CM | POA: Insufficient documentation

## 2018-04-06 DIAGNOSIS — E785 Hyperlipidemia, unspecified: Secondary | ICD-10-CM | POA: Diagnosis not present

## 2018-04-06 DIAGNOSIS — Z794 Long term (current) use of insulin: Secondary | ICD-10-CM | POA: Insufficient documentation

## 2018-04-06 DIAGNOSIS — G4733 Obstructive sleep apnea (adult) (pediatric): Secondary | ICD-10-CM | POA: Insufficient documentation

## 2018-04-06 DIAGNOSIS — E1165 Type 2 diabetes mellitus with hyperglycemia: Secondary | ICD-10-CM | POA: Diagnosis not present

## 2018-04-06 DIAGNOSIS — Z885 Allergy status to narcotic agent status: Secondary | ICD-10-CM | POA: Insufficient documentation

## 2018-04-06 DIAGNOSIS — IMO0001 Reserved for inherently not codable concepts without codable children: Secondary | ICD-10-CM

## 2018-04-06 LAB — POCT GLYCOSYLATED HEMOGLOBIN (HGB A1C): HbA1c, POC (controlled diabetic range): 9.9 % — AB (ref 0.0–7.0)

## 2018-04-06 LAB — GLUCOSE, POCT (MANUAL RESULT ENTRY): POC Glucose: 400 mg/dl — AB (ref 70–99)

## 2018-04-06 MED ORDER — GLIMEPIRIDE 4 MG PO TABS: 4.0000 mg | ORAL_TABLET | Freq: Every day | ORAL | 3 refills | Status: DC

## 2018-04-06 MED ORDER — INSULIN GLARGINE 100 UNIT/ML SOLOSTAR PEN: PEN_INJECTOR | SUBCUTANEOUS | 6 refills | Status: DC

## 2018-04-06 MED ORDER — INSULIN ASPART 100 UNIT/ML FLEXPEN: PEN_INJECTOR | SUBCUTANEOUS | 11 refills | Status: DC

## 2018-04-06 NOTE — Progress Notes (Signed)
[name], male    DOB: April 21, 1971  MRN: [id number]  CC: Diabetes   Subjective: Paul [name] is a 47 y.o. male who presents for chronic ds management.  Last seen in June of this year. His concerns today include:  Hx of DM, HL,   DM: Patient checks BS in a.m.  Range 130-mid 200s.  BS for the past 4 mornings were 199, 256, 228, 237.  Lowest was 82 at which point he did feel shaky He is on Januvia, Amaryl, metformin and Lantus.  On last visit we left him on Lantus 25 units with instructions to increase the dose by 2 units every 2 to 3 days until morning blood sugars are consistently between 90-1 30.  Just increase the dose to 27 units 3 days ago. Not working out as much due to increase course work in school.  He anticipates that this will get better after the spring. Reports good eating habits.  Does no drink sweet drinks.    Patient Active Problem List   Diagnosis Date Noted  . Erectile dysfunction 08/12/2016  . Polyp of colon 08/12/2016  . Obstructive sleep apnea 04/03/2015  . Onychomycosis of right great toe 04/03/2015  . Diabetes type 2, uncontrolled ([place]) 01/25/2014  . Dyslipidemia associated with type 2 diabetes mellitus ([place]) 01/25/2014  . Skin tag 01/25/2014     Current Outpatient Medications on File Prior to Visit  Medication Sig Dispense Refill  . Insulin Pen Needle 31G X 6 MM MISC Use as directed 100 each 0  . Insulin Syringe-Needle U-100 (B-D INS SYRINGE 0.5CC/31GX5/16) 31G X 5/16" 0.5 ML MISC 1 each by Does not apply route at bedtime. (Patient not taking: Reported on 02/03/2017) 30 each 2  . metFORMIN (GLUCOPHAGE) 1000 MG tablet Take 1 tablet (1,000 mg total) by mouth 2 (two) times daily with a meal. 180 tablet 3  . pravastatin (PRAVACHOL) 40 MG tablet Take 1 tablet (40 mg total) by mouth daily. 90 tablet 3  . sitaGLIPtin (JANUVIA) 100 MG tablet Take 1 tablet (100 mg total) by mouth daily. 90 tablet 3   No current facility-administered medications  on file prior to visit.     Allergies  Allergen Reactions  . Codeine Nausea And Vomiting and Other (See Comments)    headache    Social History   Socioeconomic History  . Marital status: Married    Spouse name: Not on file  . Number of children: 2  . Years of education: Not on file  . Highest education level: Not on file  Occupational History  . Occupation: Animal nutritionist  Social Needs  . Financial resource strain: Not on file  . Food insecurity:    Worry: Not on file    Inability: Not on file  . Transportation needs:    Medical: Not on file    Non-medical: Not on file  Tobacco Use  . Smoking status: Never Smoker  . Smokeless tobacco: Never Used  Substance and Sexual Activity  . Alcohol use: Yes    Comment: Social  . Drug use: No  . Sexual activity: Not on file  Lifestyle  . Physical activity:    Days per week: Not on file    Minutes per session: Not on file  . Stress: Not on file  Relationships  . Social connections:    Talks on phone: Not on file    Gets together: Not on file    Attends religious service: Not on file  Active member of club or organization: Not on file    Attends meetings of clubs or organizations: Not on file    Relationship status: Not on file  . Intimate partner violence:    Fear of current or ex partner: Not on file    Emotionally abused: Not on file    Physically abused: Not on file    Forced sexual activity: Not on file  Other Topics Concern  . Not on file  Social History Narrative  . Not on file    Family History  Problem Relation Age of Onset  . Hypertension Father   . Diabetes Mother   . Sleep apnea Other   . Heart attack Maternal Grandfather   . Heart attack Paternal Grandfather   . Cancer Neg Hx   . Colon cancer Neg Hx   . Esophageal cancer Neg Hx   . Stomach cancer Neg Hx   . Rectal cancer Neg Hx     Past Surgical History:  Procedure Laterality Date  . COLONOSCOPY    . KNEE ARTHROSCOPY Right   . UMBILICAL  HERNIA REPAIR      ROS: Review of Systems Neg except as above  PHYSICAL EXAM: BP 131/82   Pulse 71   Temp 98.2 F (36.8 C) (Oral)   Resp 16   Wt 195 lb 9.6 oz (88.7 kg)   SpO2 97%   BMI 28.07 kg/m   Wt Readings from Last 3 Encounters:  04/06/18 195 lb 9.6 oz (88.7 kg)  12/21/17 194 lb 3.2 oz (88.1 kg)  10/19/17 191 lb 6.4 oz (86.8 kg)  Repeat blood pressure 120/78.  Physical Exam  General appearance - alert, well appearing, and in no distress Mental status - normal mood, behavior, speech, dress, motor activity, and thought processes Neck - supple, no significant adenopathy Chest - clear to auscultation, no wheezes, rales or rhonchi, symmetric air entry Heart - normal rate, regular rhythm, normal S1, S2, no murmurs, rubs, clicks or gallops Extremities - peripheral pulses normal, no pedal edema, no clubbing or cyanosis  BS 400/A1C  9.9  ASSESSMENT AND PLAN: 1. Diabetes mellitus type 2, uncontrolled, without complications ([place]) I recommend increasing Lantus to 30 units daily.  I went over with him again how to titrate the Lantus and 2 unit increments every 3 days until morning blood sugars are consistently between 90-130.  Add NovoLog insulin 6 units to the 2 largest meals of the day. Decrease Amaryl to 4 mg once a day. Healthy eating habits discussed.  He will exercise more as his schedule allows. Advised to check blood sugars at least twice a day before breakfast and dinner.  We have scheduled an appointment for him to come back in 1 month to see our clinical pharmacist.  Advised to bring blood sugar readings with him. -He is due for flu vaccine but we are currently out of it.  Advised that he could get it at any outside pharmacy or when he comes back to see our clinical pharmacist in a month. - POCT glycosylated hemoglobin (Hb A1C) - POCT glucose (manual entry) - glimepiride (AMARYL) 4 MG tablet; Take 1 tablet (4 mg total) by mouth daily before breakfast.  Dispense: 90  tablet; Refill: 3 - Insulin Glargine (LANTUS SOLOSTAR) 100 UNIT/ML Solostar Pen; Lantus 30 units subcut daily.  Increase in 2 unit increments Q3 days until a.m BS consistently between 90-130. Max of 40 units daily  Dispense: 5 pen; Refill: 6 - insulin aspart (NOVOLOG FLEXPEN) 100  UNIT/ML FlexPen; 6 units subcut with the 2 largest meals of the day.  Dispense: 15 mL; Refill: 11   Patient was given the opportunity to ask questions.  Patient verbalized understanding of the plan and was able to repeat key elements of the plan.   Orders Placed This Encounter  Procedures  . POCT glycosylated hemoglobin (Hb A1C)  . POCT glucose (manual entry)     Requested Prescriptions   Signed Prescriptions Disp Refills  . glimepiride (AMARYL) 4 MG tablet 90 tablet 3    Sig: Take 1 tablet (4 mg total) by mouth daily before breakfast.  . Insulin Glargine (LANTUS SOLOSTAR) 100 UNIT/ML Solostar Pen 5 pen 6    Sig: Lantus 30 units subcut daily.  Increase in 2 unit increments Q3 days until a.m BS consistently between 90-130. Max of 40 units daily  . insulin aspart (NOVOLOG FLEXPEN) 100 UNIT/ML FlexPen 15 mL 11    Sig: 6 units subcut with the 2 largest meals of the day.    Return in about 3 months (around 07/07/2018).  Karle Plumber, MD, FACP

## 2018-04-06 NOTE — Patient Instructions (Signed)
Please give patient an appointment with Lurena Joiner our clinical pharmacist in 1 month.  Increase Lantus to 30 units daily.  Increase by 2 unit increments every 3 days until morning blood sugars are consistently between 90-130.  Please check your blood sugars at least twice a day before meals and bring in readings on your visit with the clinical pharmacist in 1 month.  Start NovoLog insulin.  Give yourself 6 units with the 2 largest meals of the day.  Decrease Amaryl to 4 mg once a day.  Remember to get your flu shot at any outside pharmacy or you can get it when you return in 1 month to meet with our clinical pharmacist.

## 2018-05-07 ENCOUNTER — Encounter: Payer: Self-pay | Admitting: Pharmacist

## 2018-05-07 ENCOUNTER — Ambulatory Visit: Attending: Family Medicine | Admitting: Pharmacist

## 2018-05-07 DIAGNOSIS — E1165 Type 2 diabetes mellitus with hyperglycemia: Secondary | ICD-10-CM

## 2018-05-07 DIAGNOSIS — Z8249 Family history of ischemic heart disease and other diseases of the circulatory system: Secondary | ICD-10-CM | POA: Insufficient documentation

## 2018-05-07 DIAGNOSIS — IMO0001 Reserved for inherently not codable concepts without codable children: Secondary | ICD-10-CM

## 2018-05-07 DIAGNOSIS — Z833 Family history of diabetes mellitus: Secondary | ICD-10-CM | POA: Insufficient documentation

## 2018-05-07 DIAGNOSIS — Z7984 Long term (current) use of oral hypoglycemic drugs: Secondary | ICD-10-CM | POA: Insufficient documentation

## 2018-05-07 DIAGNOSIS — Z794 Long term (current) use of insulin: Secondary | ICD-10-CM | POA: Insufficient documentation

## 2018-05-07 LAB — GLUCOSE, POCT (MANUAL RESULT ENTRY): POC GLUCOSE: 298 mg/dL — AB (ref 70–99)

## 2018-05-07 MED ORDER — INSULIN ASPART 100 UNIT/ML FLEXPEN: PEN_INJECTOR | SUBCUTANEOUS | 11 refills | Status: DC

## 2018-05-07 MED ORDER — INSULIN GLARGINE 100 UNIT/ML SOLOSTAR PEN: PEN_INJECTOR | SUBCUTANEOUS | 6 refills | Status: DC

## 2018-05-07 NOTE — Patient Instructions (Signed)
Thank you for coming to see me today. Please do the following:  1. Start Lantus 30 units daily.  Increase in 2 unit increments every days until morning blood sugar is consistently less than 130. Max of 40 units daily.  2. Continue rapid acting insulin 6 units 2 times a day.  3. Continue glimepiride, metformin, and Januvia.  4. Continue checking blood sugars at home.  5. Continue making the lifestyle changes we've discussed together during our visit. Diet and exercise play a significant role in improving your blood sugars.  6. Follow-up with me in 2-3 weeks.   Hypoglycemia or low blood sugar:   Low blood sugar can happen quickly and may become an emergency if not treated right away.   While this shouldn't happen often, it can be brought upon if you skip a meal or do not eat enough. Also, if your insulin or other diabetes medications are dosed too high, this can cause your blood sugar to go to low.   Warning signs of low blood sugar include: 1. Feeling shaky or dizzy 2. Feeling weak or tired  3. Excessive hunger 4. Feeling anxious or upset  5. Sweating even when you aren't exercising  What to do if I experience low blood sugar? 1. Check your blood sugar with your meter. If lower than 70, proceed to step 2.  2. Treat with 3-4 glucose tablets or 3 packets of regular sugar. If these aren't around, you can try hard candy. Yet another option would be to drink 4 ounces of fruit juice or 6 ounces of REGULAR soda.  3. Re-check your sugar in 15 minutes. If it is still below 70, do what you did in step 2 again. If has come back up, go ahead and eat a snack or small meal at this time.

## 2018-05-07 NOTE — Progress Notes (Signed)
    S:    PCP: Dr. Wynetta Emery  No chief complaint on file.  Patient arrives in good spirits. Presents for diabetes management at the request of Dr. Wynetta Emery. Patient was referred on 04/06/18. He was instructed to self-titrate Lantus to achieve goal fasting sugar levels.     Family/Social History:  - FHx: HTN (father), DM (mother) - Tobacco: never smoker - Alcohol: social/occasional  Insurance coverage/medication affordability:  - Tricare  Patient reports adherence with medications.  Current diabetes medications include:  - glimepiride 4 mg daily - insulin aspart 6 units BID with 2 largest meals - insulin glargine 30 units daily. Pt reports he is still taking 30 units daily.   Patient denies hypoglycemic events.  Patient reported dietary habits:  - Reports he is aware of the components of a diabetic diet - Reports that it is hard to limit carbohydrates  - Reports eating a hotdog with hotchips today before seeing me  Patient-reported exercise habits:  - Has been limited d/t to school schedule - Plans to begin lifting/walking more   Patient denies nocturia.  Patient denies neuropathy. Patient reports visual changes. Patient reports self foot exams.   O:  POCT: 298 post prandial Home fasting: 150s-190s Home random/post-prandial: 200s  Lab Results  Component Value Date   HGBA1C 9.9 (A) 04/06/2018   There were no vitals filed for this visit.  Lipid Panel     Component Value Date/Time   CHOL 131 10/19/2017 1610   TRIG 166 (H) 10/19/2017 1610   HDL 34 (L) 10/19/2017 1610   CHOLHDL 3.9 10/19/2017 1610   CHOLHDL 4.9 08/12/2016 1704   VLDL 23 08/12/2016 1704   LDLCALC 64 10/19/2017 1610   Clinical ASCVD: No  10 year ASCVD: Can't be calculated with LDL < 64  A/P: Diabetes longstanding currently uncontrolled. Patient is able to verbalize appropriate hypoglycemia management plan. Patient is adherent with medication. As he was not self-titrating insulin, will have him do  so and follow-up with me in 2-3 weeks for reassessment.   -Continued Lantus (insulin glargine). Patient will continue to titrate 2 units every 2-3 days until fasting CBGs reach goal or he reaches a dose of 40 units daily. -Continued Novolog (insulin aspart) 6 units BID. Patient educated to take 10-15 minutes before eating. -Continued metformin, glimepiride, and Januvia for now.  -Extensively discussed pathophysiology of DM, recommended lifestyle interventions, dietary effects on glycemic control -Counseled on s/sx of and management of hypoglycemia -HM: up to date on PNA and Tdap. -Next A1C anticipated 06/2018.   ASCVD risk - primary prevention in patient with DM. Last LDL is controlled. ASCVD risk score is not >20%  - moderate intensity statin indicated.  -Continued pravastatin 40 mg.   Written patient instructions provided.  Total time in face to face counseling 15 minutes.   Follow up Pharmacist Clinic Visit in 2-3 weeks.   Benard Halsted, PharmD, [place] [phone]

## 2018-05-10 ENCOUNTER — Telehealth: Payer: Self-pay | Admitting: Internal Medicine

## 2018-05-10 NOTE — Telephone Encounter (Signed)
[place] there is no message here

## 2018-05-21 ENCOUNTER — Ambulatory Visit: Attending: Internal Medicine | Admitting: Pharmacist

## 2018-05-21 ENCOUNTER — Encounter: Payer: Self-pay | Admitting: Pharmacist

## 2018-05-21 DIAGNOSIS — E1165 Type 2 diabetes mellitus with hyperglycemia: Secondary | ICD-10-CM

## 2018-05-21 DIAGNOSIS — E119 Type 2 diabetes mellitus without complications: Secondary | ICD-10-CM | POA: Insufficient documentation

## 2018-05-21 DIAGNOSIS — IMO0001 Reserved for inherently not codable concepts without codable children: Secondary | ICD-10-CM

## 2018-05-21 DIAGNOSIS — Z794 Long term (current) use of insulin: Secondary | ICD-10-CM | POA: Diagnosis not present

## 2018-05-21 DIAGNOSIS — Z833 Family history of diabetes mellitus: Secondary | ICD-10-CM | POA: Diagnosis not present

## 2018-05-21 LAB — GLUCOSE, POCT (MANUAL RESULT ENTRY): POC Glucose: 259 mg/dl — AB (ref 70–99)

## 2018-05-21 NOTE — Patient Instructions (Signed)
Thank you for coming to see me today. Please do the following:  1. Start taking Lantus 34 units daily. Increase by 2 units every couple of days if your blood sugar in the morning is above 130. Max is 40 units. 2. Continue metformin 2 times a day. 3. Continue Januvia 1 time a day. 4. Stop Glimepiride 5. Stop Novolog 6. Continue checking blood sugars at home. It's really important that you record these and bring these in to your next doctor's appointment. If you get in readings above 500 or lower than 70, call me or the clinic to let your doctor know. See below on how to treat low blood sugar.  7. Continue making the lifestyle changes we've discussed together during our visit. Diet and exercise play a significant role in improving your blood sugars.  8. Follow-up with me in 2 weeks.   Hypoglycemia or low blood sugar:   Low blood sugar can happen quickly and may become an emergency if not treated right away.   While this shouldn't happen often, it can be brought upon if you skip a meal or do not eat enough. Also, if your insulin or other diabetes medications are dosed too high, this can cause your blood sugar to go to low.   Warning signs of low blood sugar include: 1. Feeling shaky or dizzy 2. Feeling weak or tired  3. Excessive hunger 4. Feeling anxious or upset  5. Sweating even when you aren't exercising  What to do if I experience low blood sugar? 1. Check your blood sugar with your meter. If lower than 70, proceed to step 2.  2. Treat with 3-4 glucose tablets or 3 packets of regular sugar. If these aren't around, you can try hard candy. Yet another option would be to drink 4 ounces of fruit juice or 6 ounces of REGULAR soda.  3. Re-check your sugar in 15 minutes. If it is still below 70, do what you did in step 2 again. If has come back up, go ahead and eat a snack or small meal at this time.

## 2018-05-21 NOTE — Progress Notes (Signed)
    S:    PCP: Dr. Wynetta Emery  No chief complaint on file.  Patient arrives in good spirits. Presents for diabetes management at the request of Dr. Wynetta Emery. Patient was referred on 04/06/18. I last saw him 05/07/18. Pt was asked to self-titrate Lantus.   Family/Social History:  - FHx: HTN (father), DM (mother) - Tobacco: never smoker - Alcohol: social/occasional  Insurance coverage/medication affordability:  - Tricare  Patient reports adherence with medications.  Current diabetes medications include:  - glimepiride 4 mg daily - insulin aspart 6 units BID with 2 largest meals - insulin glargine 30 units daily. Reports taking 32 units daily -metformin 100 mg BID -Januvia 100 mg   Patient reports hypoglycemic events. Several objective values in 60-70s.   Patient reported dietary habits:  - Reports he is aware of the components of a diabetic diet - Reports that it is hard to limit carbohydrates   Patient-reported exercise habits:  - Has been limited d/t to school schedule - Plans to begin lifting/walking more   Patient denies nocturia.  Patient denies neuropathy. Patient denies visual changes. Patient reports self foot exams.   O:  POCT: 259  Home fasting: 97 - 184  Home random/post-prandial: 71 - 260   Lab Results  Component Value Date   HGBA1C 9.9 (A) 04/06/2018   There were no vitals filed for this visit.  Lipid Panel     Component Value Date/Time   CHOL 131 10/19/2017 1610   TRIG 166 (H) 10/19/2017 1610   HDL 34 (L) 10/19/2017 1610   CHOLHDL 3.9 10/19/2017 1610   CHOLHDL 4.9 08/12/2016 1704   VLDL 23 08/12/2016 1704   LDLCALC 64 10/19/2017 1610   Clinical ASCVD: No  10 year ASCVD: Can't be calculated with LDL < 64  A/P: Diabetes longstanding currently uncontrolled. Patient is able to verbalize appropriate hypoglycemia management plan. Patient is adherent with medication. Pt to hold glimepiride and Novolog for now d/t hypoglycemia. Will have him increase  and continue to self-titrate Lantus. He will continue Januvia and metformin for now.  -Increase Lantus to 24 units daily. Increase by 2 units every 2-3 days if morning sugars are above 130. Max dose is 40 units. -Continued metformin and Januvia for now.  -Hold Novolog -Hold glimepiride -Extensively discussed pathophysiology of DM, recommended lifestyle interventions, dietary effects on glycemic control -Counseled on s/sx of and management of hypoglycemia -HM: up to date on PNA and Tdap. Flu given today. -Next A1C anticipated 06/2018.   ASCVD risk - primary prevention in patient with DM. Last LDL is controlled. ASCVD risk score is not >20%  - moderate intensity statin indicated.  -Continued pravastatin 40 mg.   Written patient instructions provided.  Total time in face to face counseling 15 minutes.   Follow up Pharmacist Clinic Visit in 2 weeks.   Benard Halsted, PharmD, [place] [phone]

## 2018-06-03 ENCOUNTER — Ambulatory Visit: Attending: Family Medicine | Admitting: Pharmacist

## 2018-06-03 ENCOUNTER — Encounter: Payer: Self-pay | Admitting: Pharmacist

## 2018-06-03 DIAGNOSIS — Z794 Long term (current) use of insulin: Secondary | ICD-10-CM | POA: Diagnosis not present

## 2018-06-03 DIAGNOSIS — E1165 Type 2 diabetes mellitus with hyperglycemia: Secondary | ICD-10-CM | POA: Insufficient documentation

## 2018-06-03 DIAGNOSIS — IMO0001 Reserved for inherently not codable concepts without codable children: Secondary | ICD-10-CM

## 2018-06-03 NOTE — Progress Notes (Signed)
    S:    PCP: Dr. Wynetta Emery  No chief complaint on file.  Patient arrives in good spirits. Presents for diabetes management at the request of Dr. Wynetta Emery. Patient was referred on 04/06/18. I last saw him 05/21/18. We stopped his Amaryl and Novolog d/t hypoglycemia  Family/Social History:  - FHx: HTN (father), DM (mother) - Tobacco: never smoker - Alcohol: social/occasional  Insurance coverage/medication affordability:  - Tricare  Patient reports adherence with medications.  Current diabetes medications include:  - insulin glargine 30 units daily. Reports taking 34 units daily -metformin 100 mg BID -Januvia 100 mg   Patient reports hypoglycemic events. Several fasting level 60-70s.   Patient reported dietary habits:  - Reports he is aware of the components of a diabetic diet - Reports that it is hard to limit carbohydrates   Patient-reported exercise habits:  - Has been limited d/t to school schedule - Plans to begin lifting/walking more   Patient denies nocturia.  Patient denies neuropathy. Patient denies visual changes. Patient reports self foot exams.   O:  POCT: not taken Home values:  Fasting 58 - 154 Post-prandial 179 - 290  Lab Results  Component Value Date   HGBA1C 9.9 (A) 04/06/2018   There were no vitals filed for this visit.  Lipid Panel     Component Value Date/Time   CHOL 131 10/19/2017 1610   TRIG 166 (H) 10/19/2017 1610   HDL 34 (L) 10/19/2017 1610   CHOLHDL 3.9 10/19/2017 1610   CHOLHDL 4.9 08/12/2016 1704   VLDL 23 08/12/2016 1704   LDLCALC 64 10/19/2017 1610   Clinical ASCVD: No  10 year ASCVD: Can't be calculated with LDL < 64  A/P: Diabetes longstanding currently uncontrolled. Patient is able to verbalize appropriate hypoglycemia management plan. Patient is adherent with medication. From his history, I believe pt is having nocturnal hypoglycemia. He reports eating more during the day with not much in the evening time. Will move Lantus  to 34 units in the morning. If he continues to have hypoglycemia, will have to back off Lantus.   -Lantus 34 units in the morning -Continued metformin and Januvia for now.  -Hold Novolog -Hold glimepiride -Extensively discussed pathophysiology of DM, recommended lifestyle interventions, dietary effects on glycemic control -Counseled on s/sx of and management of hypoglycemia -HM: up to date on PNA, Tdap, and flu -Next A1C anticipated 06/2018.   ASCVD risk - primary prevention in patient with DM. Last LDL is controlled. ASCVD risk score is not >20%  - moderate intensity statin indicated.  -Continued pravastatin 40 mg.   Written patient instructions provided.  Total time in face to face counseling 15 minutes.   Follow up w/ PCP in 1 month.   Benard Halsted, PharmD, [place] [phone]

## 2018-06-03 NOTE — Patient Instructions (Signed)
Thank you for coming to see me today. Please do the following:  1. Start taking Lantus 34 units in the morning.  2. Continue metformin and Januvia.  3. Do not take Novolog or glimepiride.  4. Continue checking blood sugars at home.  5. Continue making the lifestyle changes we've discussed together during our visit. Diet and exercise play a significant role in improving your blood sugars.  6. Follow-up with Dr. Wynetta Emery in 1 month.    Hypoglycemia or low blood sugar:   Low blood sugar can happen quickly and may become an emergency if not treated right away.   While this shouldn't happen often, it can be brought upon if you skip a meal or do not eat enough. Also, if your insulin or other diabetes medications are dosed too high, this can cause your blood sugar to go to low.   Warning signs of low blood sugar include: 1. Feeling shaky or dizzy 2. Feeling weak or tired  3. Excessive hunger 4. Feeling anxious or upset  5. Sweating even when you aren't exercising  What to do if I experience low blood sugar? 1. Check your blood sugar with your meter. If lower than 70, proceed to step 2.  2. Treat with 3-4 glucose tablets or 3 packets of regular sugar. If these aren't around, you can try hard candy. Yet another option would be to drink 4 ounces of fruit juice or 6 ounces of REGULAR soda.  3. Re-check your sugar in 15 minutes. If it is still below 70, do what you did in step 2 again. If has come back up, go ahead and eat a snack or small meal at this time.

## 2018-06-12 ENCOUNTER — Other Ambulatory Visit: Payer: Self-pay | Admitting: Internal Medicine

## 2018-06-12 DIAGNOSIS — IMO0001 Reserved for inherently not codable concepts without codable children: Secondary | ICD-10-CM

## 2018-06-12 DIAGNOSIS — E1165 Type 2 diabetes mellitus with hyperglycemia: Principal | ICD-10-CM

## 2018-07-08 ENCOUNTER — Encounter: Payer: Self-pay | Admitting: Internal Medicine

## 2018-07-08 ENCOUNTER — Ambulatory Visit: Attending: Internal Medicine | Admitting: Internal Medicine

## 2018-07-08 VITALS — BP 113/78 | HR 74 | Temp 98.2°F | Resp 16 | Ht 70.0 in | Wt 204.6 lb

## 2018-07-08 DIAGNOSIS — E7849 Other hyperlipidemia: Secondary | ICD-10-CM | POA: Diagnosis not present

## 2018-07-08 DIAGNOSIS — E1165 Type 2 diabetes mellitus with hyperglycemia: Secondary | ICD-10-CM

## 2018-07-08 DIAGNOSIS — IMO0001 Reserved for inherently not codable concepts without codable children: Secondary | ICD-10-CM

## 2018-07-08 LAB — POCT GLYCOSYLATED HEMOGLOBIN (HGB A1C): HbA1c, POC (controlled diabetic range): 8.7 % — AB (ref 0.0–7.0)

## 2018-07-08 LAB — GLUCOSE, POCT (MANUAL RESULT ENTRY): POC Glucose: 356 mg/dl — AB (ref 70–99)

## 2018-07-08 MED ORDER — INSULIN GLARGINE 100 UNIT/ML SOLOSTAR PEN: PEN_INJECTOR | SUBCUTANEOUS | 6 refills | Status: DC

## 2018-07-08 NOTE — Patient Instructions (Signed)
Increase Lantus to 40 units daily.  Please bring in blood sugar logs with you on next visit.

## 2018-07-08 NOTE — Progress Notes (Signed)
[name], male    DOB: 1970-11-25  MRN: [id number]  CC: Diabetes   Subjective: Paul [name] is a 48 y.o. male who presents forf/u DM His concerns today include:  Hx of DM, HL,   DM: Since last visit with me in October, patient saw the clinical pharmacist a few times.  Amaryl and NovoLog were discontinued because he was having hypoglycemic episodes.  Patient continued on Lantus, metformin and Januvia.  He is currently on 36 units of Lantus.  -Admits that he did not do as well over the Christmas holidays as he found himself eating more sweet treats and not exercising as much.  He also was not checking his blood sugars regularly.  Blood sugar this morning was 277 before breakfast. -Had his eye exam done at [facility] last week.  Prescribed new glasses.  No retinopathy. Had DM eye exam  - good.  New glasses Patient Active Problem List   Diagnosis Date Noted  . Erectile dysfunction 08/12/2016  . Polyp of colon 08/12/2016  . Obstructive sleep apnea 04/03/2015  . Onychomycosis of right great toe 04/03/2015  . Diabetes type 2, uncontrolled ([place]) 01/25/2014  . Dyslipidemia associated with type 2 diabetes mellitus ([place]) 01/25/2014  . Skin tag 01/25/2014     Current Outpatient Medications on File Prior to Visit  Medication Sig Dispense Refill  . Insulin Pen Needle (B-D UF III MINI PEN NEEDLES) 31G X 5 MM MISC USE AS DIRECTED, 100 each 3  . Insulin Syringe-Needle U-100 (B-D INS SYRINGE 0.5CC/31GX5/16) 31G X 5/16" 0.5 ML MISC 1 each by Does not apply route at bedtime. (Patient not taking: Reported on 02/03/2017) 30 each 2  . metFORMIN (GLUCOPHAGE) 1000 MG tablet Take 1 tablet (1,000 mg total) by mouth 2 (two) times daily with a meal. 180 tablet 3  . pravastatin (PRAVACHOL) 40 MG tablet Take 1 tablet (40 mg total) by mouth daily. 90 tablet 3  . sitaGLIPtin (JANUVIA) 100 MG tablet Take 1 tablet (100 mg total) by mouth daily. 90 tablet 3   No current facility-administered  medications on file prior to visit.     Allergies  Allergen Reactions  . Codeine Nausea And Vomiting and Other (See Comments)    headache    Social History   Socioeconomic History  . Marital status: Married    Spouse name: Not on file  . Number of children: 2  . Years of education: Not on file  . Highest education level: Not on file  Occupational History  . Occupation: Animal nutritionist  Social Needs  . Financial resource strain: Not on file  . Food insecurity:    Worry: Not on file    Inability: Not on file  . Transportation needs:    Medical: Not on file    Non-medical: Not on file  Tobacco Use  . Smoking status: Never Smoker  . Smokeless tobacco: Never Used  Substance and Sexual Activity  . Alcohol use: Yes    Comment: Social  . Drug use: No  . Sexual activity: Not on file  Lifestyle  . Physical activity:    Days per week: Not on file    Minutes per session: Not on file  . Stress: Not on file  Relationships  . Social connections:    Talks on phone: Not on file    Gets together: Not on file    Attends religious service: Not on file    Active member of club or organization: Not  on file    Attends meetings of clubs or organizations: Not on file    Relationship status: Not on file  . Intimate partner violence:    Fear of current or ex partner: Not on file    Emotionally abused: Not on file    Physically abused: Not on file    Forced sexual activity: Not on file  Other Topics Concern  . Not on file  Social History Narrative  . Not on file    Family History  Problem Relation Age of Onset  . Hypertension Father   . Diabetes Mother   . Sleep apnea Other   . Heart attack Maternal Grandfather   . Heart attack Paternal Grandfather   . Cancer Neg Hx   . Colon cancer Neg Hx   . Esophageal cancer Neg Hx   . Stomach cancer Neg Hx   . Rectal cancer Neg Hx     Past Surgical History:  Procedure Laterality Date  . COLONOSCOPY    . KNEE ARTHROSCOPY Right   .  UMBILICAL HERNIA REPAIR      ROS: Review of Systems Negative except as above. PHYSICAL EXAM: BP 113/78   Pulse 74   Temp 98.2 F (36.8 C) (Oral)   Resp 16   Ht 5\' 10"  (1.778 m)   Wt 204 lb 9.6 oz (92.8 kg)   SpO2 99%   BMI 29.36 kg/m   Physical Exam  General appearance - alert, well appearing, and in no distress Mental status - normal mood, behavior, speech, dress, motor activity, and thought processes Neck - supple, no significant adenopathy Chest - clear to auscultation, no wheezes, rales or rhonchi, symmetric air entry Heart - normal rate, regular rhythm, normal S1, S2, no murmurs, rubs, clicks or gallops Extremities - peripheral pulses normal, no pedal edema, no clubbing or cyanosis  Results for orders placed or performed in visit on 07/08/18  POCT glucose (manual entry)  Result Value Ref Range   POC Glucose 356 (A) 70 - 99 mg/dl  POCT glycosylated hemoglobin (Hb A1C)  Result Value Ref Range   Hemoglobin A1C     HbA1c POC (<> result, manual entry)     HbA1c, POC (prediabetic range)     HbA1c, POC (controlled diabetic range) 8.7 (A) 0.0 - 7.0 %    ASSESSMENT AND PLAN:  1. Diabetes mellitus type 2, uncontrolled, without complications (HCC) His G4W is down a little more than one-point.  I commended him on this.  However our goal is to get his A1c less than 7. We discussed adding Trulicity or Victoza versus him working on getting back into his exercise routine and eating better.  Patient opted for the latter.  If things are not improved by follow-up visit he will consider adding Victoza or Trulicity. In the meantime I have recommended increasing Lantus to 40 units daily. - POCT glucose (manual entry) - POCT glycosylated hemoglobin (Hb A1C)    Patient was given the opportunity to ask questions.  Patient verbalized understanding of the plan and was able to repeat key elements of the plan.   Orders Placed This Encounter  Procedures  . POCT glucose (manual entry)  .  POCT glycosylated hemoglobin (Hb A1C)     Requested Prescriptions   Signed Prescriptions Disp Refills  . Insulin Glargine (LANTUS SOLOSTAR) 100 UNIT/ML Solostar Pen 5 pen 6    Sig: Lantus 40 units subcut daily.  Increase in 2 unit increments Q3 days until a.m BS consistently between 90-130.  Return in about 2 months (around 09/06/2018).  Karle Plumber, MD, FACP

## 2018-09-14 ENCOUNTER — Ambulatory Visit: Attending: Internal Medicine | Admitting: Internal Medicine

## 2018-09-14 ENCOUNTER — Encounter: Payer: Self-pay | Admitting: Internal Medicine

## 2018-09-14 ENCOUNTER — Other Ambulatory Visit: Payer: Self-pay

## 2018-09-14 VITALS — BP 113/76 | HR 72 | Temp 98.1°F | Resp 16 | Wt 209.6 lb

## 2018-09-14 DIAGNOSIS — E1169 Type 2 diabetes mellitus with other specified complication: Secondary | ICD-10-CM

## 2018-09-14 DIAGNOSIS — E1165 Type 2 diabetes mellitus with hyperglycemia: Secondary | ICD-10-CM

## 2018-09-14 DIAGNOSIS — E785 Hyperlipidemia, unspecified: Secondary | ICD-10-CM

## 2018-09-14 DIAGNOSIS — IMO0001 Reserved for inherently not codable concepts without codable children: Secondary | ICD-10-CM

## 2018-09-14 LAB — GLUCOSE, POCT (MANUAL RESULT ENTRY): POC Glucose: 268 mg/dl — AB (ref 70–99)

## 2018-09-14 MED ORDER — METFORMIN HCL 1000 MG PO TABS: 1000.0000 mg | ORAL_TABLET | Freq: Two times a day (BID) | ORAL | 3 refills | Status: DC

## 2018-09-14 MED ORDER — SITAGLIPTIN PHOSPHATE 100 MG PO TABS: 100.0000 mg | ORAL_TABLET | Freq: Every day | ORAL | 3 refills | Status: DC

## 2018-09-14 MED ORDER — PRAVASTATIN SODIUM 40 MG PO TABS: 40.0000 mg | ORAL_TABLET | Freq: Every day | ORAL | 3 refills | Status: DC

## 2018-09-14 NOTE — Progress Notes (Signed)
[name], male    DOB: 28-Jul-1970  MRN: [id number]  CC:  Chronic ds management  Subjective: Paul [name] is a 47 y.o. male who presents for 2 months follow-up on diabetes..  His concerns today include:  Hx of DM, HL,  DM: Since last visit with me patient has been checking his blood sugars at least once a day.  He brings in his log with him.  Most of the readings are early morning.  During the month of January blood sugars were fluctuating with some as low as 82 and others in the 200s.  However over the past month, blood sugars have been coming down.  As a matter fact over the past week he has been having readings between 55-92.  Patient attributes these low readings to the fact that he has been exercising more since his classes have been canceled due to the corona virus outbreak.  He walks almost daily and then does weight training in the afternoon.  He reports that he is eating healthy but larger portions since he is working out more.  He reports compliance with his oral medications and Lantus 40 units daily.  He requests refill on his oral medications to his mail order pharmacy. Patient Active Problem List   Diagnosis Date Noted   Erectile dysfunction 08/12/2016   Polyp of colon 08/12/2016   Obstructive sleep apnea 04/03/2015   Onychomycosis of right great toe 04/03/2015   Diabetes type 2, uncontrolled ([place]) 01/25/2014   Dyslipidemia associated with type 2 diabetes mellitus ([place]) 01/25/2014   Skin tag 01/25/2014     Current Outpatient Medications on File Prior to Visit  Medication Sig Dispense Refill   Insulin Glargine (LANTUS SOLOSTAR) 100 UNIT/ML Solostar Pen Lantus 40 units subcut daily.  Increase in 2 unit increments Q3 days until a.m BS consistently between 90-130. 5 pen 6   Insulin Pen Needle (B-D UF III MINI PEN NEEDLES) 31G X 5 MM MISC USE AS DIRECTED, 100 each 3   Insulin Syringe-Needle U-100 (B-D INS SYRINGE 0.5CC/31GX5/16) 31G X 5/16" 0.5  ML MISC 1 each by Does not apply route at bedtime. (Patient not taking: Reported on 02/03/2017) 30 each 2   No current facility-administered medications on file prior to visit.     Allergies  Allergen Reactions   Codeine Nausea And Vomiting and Other (See Comments)    headache    Social History   Socioeconomic History   Marital status: Married    Spouse name: Not on file   Number of children: 2   Years of education: Not on file   Highest education level: Not on file  Occupational History   Occupation: Animal nutritionist  Social Needs   Financial resource strain: Not on file   Food insecurity:    Worry: Not on file    Inability: Not on file   Transportation needs:    Medical: Not on file    Non-medical: Not on file  Tobacco Use   Smoking status: Never Smoker   Smokeless tobacco: Never Used  Substance and Sexual Activity   Alcohol use: Yes    Comment: Social   Drug use: No   Sexual activity: Not on file  Lifestyle   Physical activity:    Days per week: Not on file    Minutes per session: Not on file   Stress: Not on file  Relationships   Social connections:    Talks on phone: Not on file  Gets together: Not on file    Attends religious service: Not on file    Active member of club or organization: Not on file    Attends meetings of clubs or organizations: Not on file    Relationship status: Not on file   Intimate partner violence:    Fear of current or ex partner: Not on file    Emotionally abused: Not on file    Physically abused: Not on file    Forced sexual activity: Not on file  Other Topics Concern   Not on file  Social History Narrative   Not on file    Family History  Problem Relation Age of Onset   Hypertension Father    Diabetes Mother    Sleep apnea Other    Heart attack Maternal Grandfather    Heart attack Paternal Grandfather    Cancer Neg Hx    Colon cancer Neg Hx    Esophageal cancer Neg Hx    Stomach cancer  Neg Hx    Rectal cancer Neg Hx     Past Surgical History:  Procedure Laterality Date   COLONOSCOPY     KNEE ARTHROSCOPY Right    UMBILICAL HERNIA REPAIR      ROS: Review of Systems Negative except as stated above  PHYSICAL EXAM: BP 113/76    Pulse 72    Temp 98.1 F (36.7 C) (Oral)    Resp 16    Wt 209 lb 9.6 oz (95.1 kg)    SpO2 100%    BMI 30.07 kg/m   Physical Exam General appearance - alert, well appearing, and in no distress Mental status - normal mood, behavior, speech, dress, motor activity, and thought processes Chest - clear to auscultation, no wheezes, rales or rhonchi, symmetric air entry Heart - normal rate, regular rhythm, normal S1, S2, no murmurs, rubs, clicks or gallops Extremities - peripheral pulses normal, no pedal edema, no clubbing or cyanosis Results for orders placed or performed in visit on 09/14/18  POCT glucose (manual entry)  Result Value Ref Range   POC Glucose 268 (A) 70 - 99 mg/dl     CMP Latest Ref Rng & Units 10/19/2017 08/12/2016 12/11/2015  Glucose 65 - 99 mg/dL 295(H) 300(H) 135(H)  BUN 6 - 24 mg/dL 15 19 16   Creatinine 0.76 - 1.27 mg/dL 0.87 0.84 0.87  Sodium 134 - 144 mmol/L 138 136 141  Potassium 3.5 - 5.2 mmol/L 4.4 3.9 4.3  Chloride 96 - 106 mmol/L 97 99 104  CO2 20 - 29 mmol/L 24 27 24   Calcium 8.7 - 10.2 mg/dL 9.6 9.3 9.0  Total Protein 6.0 - 8.5 g/dL 6.7 6.5 6.3  Total Bilirubin 0.0 - 1.2 mg/dL 0.6 0.7 1.0  Alkaline Phos 39 - 117 IU/L 73 68 46  AST 0 - 40 IU/L 12 14 13   ALT 0 - 44 IU/L 16 19 14    Lipid Panel     Component Value Date/Time   CHOL 131 10/19/2017 1610   TRIG 166 (H) 10/19/2017 1610   HDL 34 (L) 10/19/2017 1610   CHOLHDL 3.9 10/19/2017 1610   CHOLHDL 4.9 08/12/2016 1704   VLDL 23 08/12/2016 1704   LDLCALC 64 10/19/2017 1610    CBC    Component Value Date/Time   WBC 8.7 10/19/2017 1610   WBC 8.9 01/25/2014 1541   RBC 4.81 10/19/2017 1610   RBC 4.90 01/25/2014 1541   HGB 14.5 10/19/2017 1610   HCT  42.4 10/19/2017 1610  PLT 198 10/19/2017 1610   MCV 88 10/19/2017 1610   MCH 30.1 10/19/2017 1610   MCH 30.4 01/25/2014 1541   MCHC 34.2 10/19/2017 1610   MCHC 35.4 01/25/2014 1541   RDW 13.0 10/19/2017 1610   LYMPHSABS 2.3 01/25/2014 1541   MONOABS 0.6 01/25/2014 1541   EOSABS 0.5 01/25/2014 1541   BASOSABS 0.1 01/25/2014 1541    ASSESSMENT AND PLAN: 1. Diabetes mellitus type 2, uncontrolled, without complications ([place]) Advised patient that he should adjust his Lantus if he finds that his blood sugars in the mornings are running less than 80.  He can decrease the Lantus in increments of 3 units every 2 to 3 days.  He can also adjust up if blood sugars start running higher than 130 in the mornings. - POCT glucose (manual entry) - metFORMIN (GLUCOPHAGE) 1000 MG tablet; Take 1 tablet (1,000 mg total) by mouth 2 (two) times daily with a meal.  Dispense: 180 tablet; Refill: 3 - sitaGLIPtin (JANUVIA) 100 MG tablet; Take 1 tablet (100 mg total) by mouth daily.  Dispense: 90 tablet; Refill: 3  2. Dyslipidemia associated with type 2 diabetes mellitus (HCC) - pravastatin (PRAVACHOL) 40 MG tablet; Take 1 tablet (40 mg total) by mouth daily.  Dispense: 90 tablet; Refill: 3   Patient was given the opportunity to ask questions.  Patient verbalized understanding of the plan and was able to repeat key elements of the plan.   Orders Placed This Encounter  Procedures   POCT glucose (manual entry)     Requested Prescriptions   Signed Prescriptions Disp Refills   metFORMIN (GLUCOPHAGE) 1000 MG tablet 180 tablet 3    Sig: Take 1 tablet (1,000 mg total) by mouth 2 (two) times daily with a meal.   pravastatin (PRAVACHOL) 40 MG tablet 90 tablet 3    Sig: Take 1 tablet (40 mg total) by mouth daily.   sitaGLIPtin (JANUVIA) 100 MG tablet 90 tablet 3    Sig: Take 1 tablet (100 mg total) by mouth daily.    Return in about 3 months (around 12/15/2018).  Karle Plumber, MD, FACP

## 2018-09-14 NOTE — Patient Instructions (Signed)
Continue to monitor your blood sugars.  If you find that your morning blood sugars consistently run less than 80 especially now that you are exercising more, decrease your Lantus insulin in 3 unit increments.  Goal is to get morning blood sugars between 90-30.

## 2018-11-26 ENCOUNTER — Telehealth: Payer: Self-pay

## 2018-11-26 NOTE — Telephone Encounter (Signed)
Called patient for hypoglycemia education. Left HIPAA compliant message. Will attempt to call patient next week.  Paul [name], Sherian Rein D PGY1 Pharmacy Resident  Phone [phone] 11/26/2018      12:31 PM

## 2018-12-16 ENCOUNTER — Encounter: Payer: Self-pay | Admitting: Internal Medicine

## 2018-12-16 ENCOUNTER — Other Ambulatory Visit: Payer: Self-pay

## 2018-12-16 ENCOUNTER — Ambulatory Visit: Attending: Internal Medicine | Admitting: Internal Medicine

## 2018-12-16 DIAGNOSIS — E785 Hyperlipidemia, unspecified: Secondary | ICD-10-CM

## 2018-12-16 DIAGNOSIS — E1169 Type 2 diabetes mellitus with other specified complication: Secondary | ICD-10-CM

## 2018-12-16 DIAGNOSIS — Z794 Long term (current) use of insulin: Secondary | ICD-10-CM

## 2018-12-16 DIAGNOSIS — L728 Other follicular cysts of the skin and subcutaneous tissue: Secondary | ICD-10-CM | POA: Diagnosis not present

## 2018-12-16 DIAGNOSIS — L729 Follicular cyst of the skin and subcutaneous tissue, unspecified: Secondary | ICD-10-CM

## 2018-12-16 DIAGNOSIS — E119 Type 2 diabetes mellitus without complications: Secondary | ICD-10-CM

## 2018-12-16 NOTE — Progress Notes (Signed)
Virtual Visit via Telephone Note Due to current restrictions/limitations of in-office visits due to the COVID-19 pandemic, this scheduled clinical appointment was converted to a telehealth visit  I connected with Paul [name] on 12/16/18 at 2:46 p.m by telephone and verified that I am speaking with the correct person using two identifiers. I am in my office.  The patient is at home.  Only the patient and myself participated in this encounter.  I discussed the limitations, risks, security and privacy concerns of performing an evaluation and management service by telephone and the availability of in person appointments. I also discussed with the patient that there may be a patient responsible charge related to this service. The patient expressed understanding and agreed to proceed.   History of Present Illness: Hx of DM, HL,patient was last seen 08/2018.  Purpose of today's visit is chronic disease management for follow-up on his diabetes.  DM: Blood sugar today was 155 having eaten about 30 minutes ago.  He has not been checking blood sugars regularly since last visit.  He states that he has been checking about 25% of the times.  Blood sugar range when he does check has been 80-130.  He has had occasional low blood sugars.  He is now back in school and the gym is close so he has not been exercising as much.  However he states that he does try to get out and walk sometimes -Eating habits are about the same.  Usually has oatmeal for breakfast, salad for lunch and then a large dinner. -On last visit he had reported low blood sugar episodes and so I have told him how to adjust Lantus.  However he has stayed on the 40 units and has done okay with that.  He reports compliance with his oral medications.  HL: Taking atorvastatin.  Complains of having a bump or cyst on the palmar surface of the left hand close to the fifth digit.  Present for the past 1 to 2 months.  He has not paid attention to whether  it has increased in size.  He thinks it may be a cyst.  Has had cysts removed before from his left forearm and shoulder.  He does not think it is a callus.   Outpatient Encounter Medications as of 12/16/2018  Medication Sig  . Insulin Glargine (LANTUS SOLOSTAR) 100 UNIT/ML Solostar Pen Lantus 40 units subcut daily.  Increase in 2 unit increments Q3 days until a.m BS consistently between 90-130.  [place] Kitchen Insulin Pen Needle (B-D UF III MINI PEN NEEDLES) 31G X 5 MM MISC USE AS DIRECTED,  . Insulin Syringe-Needle U-100 (B-D INS SYRINGE 0.5CC/31GX5/16) 31G X 5/16" 0.5 ML MISC 1 each by Does not apply route at bedtime. (Patient not taking: Reported on 02/03/2017)  . metFORMIN (GLUCOPHAGE) 1000 MG tablet Take 1 tablet (1,000 mg total) by mouth 2 (two) times daily with a meal.  . pravastatin (PRAVACHOL) 40 MG tablet Take 1 tablet (40 mg total) by mouth daily.  . sitaGLIPtin (JANUVIA) 100 MG tablet Take 1 tablet (100 mg total) by mouth daily.   No facility-administered encounter medications on file as of 12/16/2018.     Observations/Objective: No direct observation done as this was a telephone encounter.  Assessment and Plan: 1. Controlled type 2 diabetes mellitus without complication, with long-term current use of insulin ([place]) Patient agreeable to coming to the lab sometime over the next week to have blood work done including an A1c. Encouraged to continue healthy eating habits.  Encouraged him to get out and walk at least 3 to 4 days a week for 30 minutes We will continue Lantus and his oral medications at the current dose - CBC; Future - Lipid panel; Future - Comprehensive metabolic panel; Future - Hemoglobin A1c; Future - Microalbumin / creatinine urine ratio; Future  2. Dyslipidemia associated with type 2 diabetes mellitus (HCC) Continue Pravachol  3. Cyst of skin Likely a cyst.  He will keep an eye on this.  If it increases in size he will make an urgent care appointment to have it  evaluated.   Follow Up Instructions: Follow-up in 3 months or sooner if any problems occur.  I discussed the assessment and treatment plan with the patient. The patient was provided an opportunity to ask questions and all were answered. The patient agreed with the plan and demonstrated an understanding of the instructions.   The patient was advised to call back or seek an in-person evaluation if the symptoms worsen or if the condition fails to improve as anticipated.  I provided 15 minutes of non-face-to-face time during this encounter.   Karle Plumber, MD

## 2018-12-20 ENCOUNTER — Telehealth: Payer: Self-pay | Admitting: Internal Medicine

## 2018-12-20 NOTE — Telephone Encounter (Signed)
-----   Message from Jackelyn Knife, [place] sent at 12/16/2018  3:24 PM EDT ----- Please contact pt and schedule 3 month f/u

## 2018-12-20 NOTE — Telephone Encounter (Signed)
Called the pt to make a 3 month fu lvm for them to call back.

## 2018-12-29 ENCOUNTER — Other Ambulatory Visit: Payer: Self-pay

## 2018-12-29 ENCOUNTER — Ambulatory Visit: Attending: Internal Medicine

## 2018-12-29 DIAGNOSIS — E119 Type 2 diabetes mellitus without complications: Secondary | ICD-10-CM

## 2018-12-29 DIAGNOSIS — Z794 Long term (current) use of insulin: Secondary | ICD-10-CM

## 2018-12-30 LAB — COMPREHENSIVE METABOLIC PANEL
ALT: 17 IU/L (ref 0–44)
AST: 15 IU/L (ref 0–40)
Albumin/Globulin Ratio: 2.5 — ABNORMAL HIGH (ref 1.2–2.2)
Albumin: 4.8 g/dL (ref 4.0–5.0)
Alkaline Phosphatase: 62 IU/L (ref 39–117)
BUN/Creatinine Ratio: 13 (ref 9–20)
BUN: 10 mg/dL (ref 6–24)
Bilirubin Total: 0.7 mg/dL (ref 0.0–1.2)
CO2: 22 mmol/L (ref 20–29)
Calcium: 9.5 mg/dL (ref 8.7–10.2)
Chloride: 102 mmol/L (ref 96–106)
Creatinine, Ser: 0.77 mg/dL (ref 0.76–1.27)
GFR calc Af Amer: 123 mL/min/{1.73_m2} (ref >59)
GFR calc non Af Amer: 107 mL/min/{1.73_m2} (ref >59)
Globulin, Total: 1.9 g/dL (ref 1.5–4.5)
Glucose: 55 mg/dL — ABNORMAL LOW (ref 65–99)
Potassium: 3.6 mmol/L (ref 3.5–5.2)
Sodium: 142 mmol/L (ref 134–144)
Total Protein: 6.7 g/dL (ref 6.0–8.5)

## 2018-12-30 LAB — LIPID PANEL
Chol/HDL Ratio: 3.7 ratio (ref 0.0–5.0)
Cholesterol, Total: 145 mg/dL (ref 100–199)
HDL: 39 mg/dL — ABNORMAL LOW (ref >39)
LDL Calculated: 91 mg/dL (ref 0–99)
Triglycerides: 77 mg/dL (ref 0–149)
VLDL Cholesterol Cal: 15 mg/dL (ref 5–40)

## 2018-12-30 LAB — CBC
Hematocrit: 42.3 % (ref 37.5–51.0)
Hemoglobin: 15.1 g/dL (ref 13.0–17.7)
MCH: 30.6 pg (ref 26.6–33.0)
MCHC: 35.7 g/dL (ref 31.5–35.7)
MCV: 86 fL (ref 79–97)
Platelets: 235 10*3/uL (ref 150–450)
RBC: 4.93 x10E6/uL (ref 4.14–5.80)
RDW: 12.5 % (ref 11.6–15.4)
WBC: 10.7 10*3/uL (ref 3.4–10.8)

## 2018-12-30 LAB — HEMOGLOBIN A1C
Est. average glucose Bld gHb Est-mCnc: 177 mg/dL
Hgb A1c MFr Bld: 7.8 % — ABNORMAL HIGH (ref 4.8–5.6)

## 2018-12-30 LAB — MICROALBUMIN / CREATININE URINE RATIO
Creatinine, Urine: 23.9 mg/dL
Microalb/Creat Ratio: <13 mg/g creat (ref 0–29)
Microalbumin, Urine: <3 ug/mL

## 2019-02-22 ENCOUNTER — Other Ambulatory Visit: Payer: Self-pay | Admitting: Internal Medicine

## 2019-02-22 DIAGNOSIS — IMO0001 Reserved for inherently not codable concepts without codable children: Secondary | ICD-10-CM

## 2019-03-25 ENCOUNTER — Ambulatory Visit: Attending: Internal Medicine | Admitting: Internal Medicine

## 2019-03-25 ENCOUNTER — Other Ambulatory Visit: Payer: Self-pay

## 2019-03-25 ENCOUNTER — Encounter: Payer: Self-pay | Admitting: Internal Medicine

## 2019-03-25 VITALS — BP 111/67 | HR 73 | Temp 98.2°F | Resp 18 | Ht 70.0 in | Wt 206.0 lb

## 2019-03-25 DIAGNOSIS — L729 Follicular cyst of the skin and subcutaneous tissue, unspecified: Secondary | ICD-10-CM

## 2019-03-25 DIAGNOSIS — IMO0001 Reserved for inherently not codable concepts without codable children: Secondary | ICD-10-CM

## 2019-03-25 DIAGNOSIS — E1169 Type 2 diabetes mellitus with other specified complication: Secondary | ICD-10-CM

## 2019-03-25 DIAGNOSIS — E1165 Type 2 diabetes mellitus with hyperglycemia: Secondary | ICD-10-CM

## 2019-03-25 LAB — POCT GLYCOSYLATED HEMOGLOBIN (HGB A1C): Hemoglobin A1C: 8.9 % — AB (ref 4.0–5.6)

## 2019-03-25 LAB — GLUCOSE, POCT (MANUAL RESULT ENTRY): POC Glucose: 182 mg/dl — AB (ref 70–99)

## 2019-03-25 MED ORDER — DAPAGLIFLOZIN PROPANEDIOL 5 MG PO TABS: 5.0000 mg | ORAL_TABLET | Freq: Every day | ORAL | 3 refills | Status: DC

## 2019-03-25 NOTE — Progress Notes (Signed)
[name], male    DOB: 12/20/1970  MRN: [id number]  CC: Diabetes   Subjective: Paul [name] is a 48 y.o. male who presents for chronic ds management. His concerns today include:  Hx of DM, HL, OSA on CPAP  DIABETES TYPE 2 Last A1C:   Results for orders placed or performed in visit on 03/25/19  HgB A1c  Result Value Ref Range   Hemoglobin A1C 8.9 (A) 4.0 - 5.6 %   HbA1c POC (<> result, manual entry)     HbA1c, POC (prediabetic range)     HbA1c, POC (controlled diabetic range)    Glucose (CBG)  Result Value Ref Range   POC Glucose 182 (A) 70 - 99 mg/dl    Med Adherence:  [x]  Yes -currently taking Lantus 42 units, Januvia and metformin   []  No Medication side effects:  []  Yes    [x]  No Home Monitoring?  [x]  Yes but not often enough    []  No Home glucose results range: Diet Adherence: [x]  Yes -splurges once a wk at a [place] resturant Exercise: [x]  Yes -walking and sometimes goes to gym 4 x a wk Hypoglycemic episodes?: [x]  Yes - had hypoglycemic episode around 4 a.m this morning. Level was 59; felt shaky.  Took a glucose tab and eat some peanut butter and drank a glass of milk.  Repeat was 171. Had a few other episodes since last visit 3 mths ago.  Numbness of the feet? []  Yes    []  No Retinopathy hx? []  Yes    []  No Last eye exam:  Comments:   HL: LDL was not at goal when checked on last visit.  It was 91.  Patient reports compliance with Pravachol.  He wanted to show me the bump on the palmar surface of the left hand.  It has not increased in size since we last spoke.   Patient Active Problem List   Diagnosis Date Noted  . Erectile dysfunction 08/12/2016  . Polyp of colon 08/12/2016  . Obstructive sleep apnea 04/03/2015  . Onychomycosis of right great toe 04/03/2015  . Diabetes type 2, uncontrolled ([place]) 01/25/2014  . Dyslipidemia associated with type 2 diabetes mellitus ([place]) 01/25/2014  . Skin tag 01/25/2014     Current Outpatient  Medications on File Prior to Visit  Medication Sig Dispense Refill  . Insulin Glargine (LANTUS SOLOSTAR) 100 UNIT/ML Solostar Pen Lantus 40 units subcut daily. Increase in 2 unit increments Q3 days until a.m BS consistently between 90-130. 15 mL 2  . Insulin Pen Needle (B-D UF III MINI PEN NEEDLES) 31G X 5 MM MISC USE AS DIRECTED, 100 each 3  . metFORMIN (GLUCOPHAGE) 1000 MG tablet Take 1 tablet (1,000 mg total) by mouth 2 (two) times daily with a meal. 180 tablet 3  . pravastatin (PRAVACHOL) 40 MG tablet Take 1 tablet (40 mg total) by mouth daily. 90 tablet 3  . sitaGLIPtin (JANUVIA) 100 MG tablet Take 1 tablet (100 mg total) by mouth daily. 90 tablet 3  . Insulin Syringe-Needle U-100 (B-D INS SYRINGE 0.5CC/31GX5/16) 31G X 5/16" 0.5 ML MISC 1 each by Does not apply route at bedtime. (Patient not taking: Reported on 02/03/2017) 30 each 2   No current facility-administered medications on file prior to visit.     Allergies  Allergen Reactions  . Codeine Nausea And Vomiting and Other (See Comments)    headache    Social History   Socioeconomic History  . Marital status:  Married    Spouse name: Not on file  . Number of children: 2  . Years of education: Not on file  . Highest education level: Not on file  Occupational History  . Occupation: Animal nutritionist  Social Needs  . Financial resource strain: Not on file  . Food insecurity    Worry: Not on file    Inability: Not on file  . Transportation needs    Medical: Not on file    Non-medical: Not on file  Tobacco Use  . Smoking status: Never Smoker  . Smokeless tobacco: Never Used  Substance and Sexual Activity  . Alcohol use: Yes    Comment: Social  . Drug use: No  . Sexual activity: Yes  Lifestyle  . Physical activity    Days per week: Not on file    Minutes per session: Not on file  . Stress: Not on file  Relationships  . Social Herbalist on phone: Not on file    Gets together: Not on file    Attends  religious service: Not on file    Active member of club or organization: Not on file    Attends meetings of clubs or organizations: Not on file    Relationship status: Not on file  . Intimate partner violence    Fear of current or ex partner: Not on file    Emotionally abused: Not on file    Physically abused: Not on file    Forced sexual activity: Not on file  Other Topics Concern  . Not on file  Social History Narrative  . Not on file    Family History  Problem Relation Age of Onset  . Hypertension Father   . Diabetes Mother   . Sleep apnea Other   . Heart attack Maternal Grandfather   . Heart attack Paternal Grandfather   . Cancer Neg Hx   . Colon cancer Neg Hx   . Esophageal cancer Neg Hx   . Stomach cancer Neg Hx   . Rectal cancer Neg Hx     Past Surgical History:  Procedure Laterality Date  . COLONOSCOPY    . KNEE ARTHROSCOPY Right   . UMBILICAL HERNIA REPAIR      ROS: Review of Systems Negative except as stated above  PHYSICAL EXAM: BP 111/67 (BP Location: Left Arm, Patient Position: Sitting, Cuff Size: Large)   Pulse 73   Temp 98.2 F (36.8 C) (Oral)   Resp 18   Ht 5\' 10"  (1.778 m)   Wt 206 lb (93.4 kg)   SpO2 98%   BMI 29.56 kg/m   Physical Exam  General appearance - alert, well appearing, and in no distress Mental status - normal mood, behavior, speech, dress, motor activity, and thought processes Neck - supple, no significant adenopathy Chest - clear to auscultation, no wheezes, rales or rhonchi, symmetric air entry Heart - normal rate, regular rhythm, normal S1, S2, no murmurs, rubs, clicks or gallops Extremities - peripheral pulses normal, no pedal edema, no clubbing or cyanosis Skin - 0.5x0.7 cm cyst on the palmar surface of the left hand between the fourth and fifth metacarpal  Diabetic Foot Exam - Simple   Simple Foot Form Visual Inspection See comments: Yes Sensation Testing Intact to touch and monofilament testing bilaterally: Yes  Pulse Check Posterior Tibialis and Dorsalis pulse intact bilaterally: Yes Comments Tiny nonulcerative callus on the left plantar foot laterally     CMP Latest Ref Rng & Units  12/29/2018 10/19/2017 08/12/2016  Glucose 65 - 99 mg/dL 55(L) 295(H) 300(H)  BUN 6 - 24 mg/dL 10 15 19   Creatinine 0.76 - 1.27 mg/dL 0.77 0.87 0.84  Sodium 134 - 144 mmol/L 142 138 136  Potassium 3.5 - 5.2 mmol/L 3.6 4.4 3.9  Chloride 96 - 106 mmol/L 102 97 99  CO2 20 - 29 mmol/L 22 24 27   Calcium 8.7 - 10.2 mg/dL 9.5 9.6 9.3  Total Protein 6.0 - 8.5 g/dL 6.7 6.7 6.5  Total Bilirubin 0.0 - 1.2 mg/dL 0.7 0.6 0.7  Alkaline Phos 39 - 117 IU/L 62 73 68  AST 0 - 40 IU/L 15 12 14   ALT 0 - 44 IU/L 17 16 19    Lipid Panel     Component Value Date/Time   CHOL 145 12/29/2018 1353   TRIG 77 12/29/2018 1353   HDL 39 (L) 12/29/2018 1353   CHOLHDL 3.7 12/29/2018 1353   CHOLHDL 4.9 08/12/2016 1704   VLDL 23 08/12/2016 1704   LDLCALC 91 12/29/2018 1353    CBC    Component Value Date/Time   WBC 10.7 12/29/2018 1353   WBC 8.9 01/25/2014 1541   RBC 4.93 12/29/2018 1353   RBC 4.90 01/25/2014 1541   HGB 15.1 12/29/2018 1353   HCT 42.3 12/29/2018 1353   PLT 235 12/29/2018 1353   MCV 86 12/29/2018 1353   MCH 30.6 12/29/2018 1353   MCH 30.4 01/25/2014 1541   MCHC 35.7 12/29/2018 1353   MCHC 35.4 01/25/2014 1541   RDW 12.5 12/29/2018 1353   LYMPHSABS 2.3 01/25/2014 1541   MONOABS 0.6 01/25/2014 1541   EOSABS 0.5 01/25/2014 1541   BASOSABS 0.1 01/25/2014 1541    ASSESSMENT AND PLAN: 1. Uncontrolled type 2 diabetes mellitus without complication, without long-term current use of insulin (HCC) A1c is increased compared to last visit.  He is doing well in terms of physical activity.  Reports that he is doing well with his eating habits.  I recommend adding [place].  I went over how the medication works and possible side effects including frequent UTIs.  Advised to watch out for infections in the groin area and to let me  know if it occurs.  Due to his early morning hypoglycemic episodes, I recommend that he decrease the Lantus to 40 units.  Advised to decrease in 2 unit increments if he continues to have hypoglycemia even with the change to 40 units. - HgB A1c - Glucose (CBG) - dapagliflozin propanediol (FARXIGA) 5 MG TABS tablet; Take 5 mg by mouth daily before breakfast.  Dispense: 90 tablet; Refill: 3  2. Dyslipidemia associated with type 2 diabetes mellitus (HCC) Continue Pravachol.  If on repeat his LDL is not at goal, we will change to Lipitor or Crestor  3. Cyst of skin Observe for now since this has not increased since we last spoke.  Patient delay on getting flu shot.  He states that he will return in about a month as a nurse only visit to get his flu shot.    Patient was given the opportunity to ask questions.  Patient verbalized understanding of the plan and was able to repeat key elements of the plan.   Orders Placed This Encounter  Procedures  . HgB A1c  . Glucose (CBG)     Requested Prescriptions   Signed Prescriptions Disp Refills  . dapagliflozin propanediol (FARXIGA) 5 MG TABS tablet 90 tablet 3    Sig: Take 5 mg by mouth daily before breakfast.  Return in about 3 months (around 06/24/2019).  Karle Plumber, MD, FACP

## 2019-03-25 NOTE — Patient Instructions (Signed)
Decrease Lantus insulin to 40 units at bedtime.  Decrease in 2 unit increments if you continue to have low blood sugar episodes in the early morning hours.  Start Farxiga 5 mg daily.  Please check your blood sugars at least twice a day.  Please bring in log on your next visit.

## 2019-03-29 ENCOUNTER — Ambulatory Visit: Admitting: Internal Medicine

## 2019-05-16 ENCOUNTER — Telehealth: Payer: Self-pay | Admitting: Internal Medicine

## 2019-05-16 DIAGNOSIS — E119 Type 2 diabetes mellitus without complications: Secondary | ICD-10-CM

## 2019-05-16 DIAGNOSIS — Z794 Long term (current) use of insulin: Secondary | ICD-10-CM

## 2019-05-16 MED ORDER — SITAGLIPTIN PHOSPHATE 100 MG PO TABS: 100.0000 mg | ORAL_TABLET | Freq: Every day | ORAL | 0 refills | Status: DC

## 2019-05-16 MED ORDER — METFORMIN HCL 1000 MG PO TABS: 1000.0000 mg | ORAL_TABLET | Freq: Two times a day (BID) | ORAL | 0 refills | Status: DC

## 2019-05-16 MED ORDER — LANTUS SOLOSTAR 100 UNIT/ML ~~LOC~~ SOPN: PEN_INJECTOR | SUBCUTANEOUS | 2 refills | Status: DC

## 2019-05-16 NOTE — Telephone Encounter (Signed)
1) Medication(s) Requested (by name): Insulin Glargine (LANTUS SOLOSTAR) 100 UNIT/ML Solostar Pen  metFORMIN (GLUCOPHAGE) 1000 MG tablet  sitaGLIPtin (JANUVIA) 100 MG tablet    2) Pharmacy of Choice: [facility]  [address] [place], [place] [address]   3) Special Requests: Patient request 90 day supply    Approved medications will be sent to the pharmacy, we will reach out if there is an issue.  Requests made after 3pm may not be addressed until the following business day!  If a patient is unsure of the name of the medication(s) please note and ask patient to call back when they are able to provide all info, do not send to responsible party until all information is available!

## 2019-05-16 NOTE — Telephone Encounter (Signed)
Paul [name] may you take car of this

## 2019-05-16 NOTE — Telephone Encounter (Signed)
Refills sent

## 2019-06-08 ENCOUNTER — Telehealth: Payer: Self-pay | Admitting: Internal Medicine

## 2019-06-08 DIAGNOSIS — E785 Hyperlipidemia, unspecified: Secondary | ICD-10-CM

## 2019-06-08 DIAGNOSIS — E1169 Type 2 diabetes mellitus with other specified complication: Secondary | ICD-10-CM

## 2019-06-08 DIAGNOSIS — Z794 Long term (current) use of insulin: Secondary | ICD-10-CM

## 2019-06-08 DIAGNOSIS — E119 Type 2 diabetes mellitus without complications: Secondary | ICD-10-CM

## 2019-06-08 NOTE — Telephone Encounter (Signed)
1) Medication(s) Requested (by name):pravastatin (PRAVACHOL) 40 MG tablet [id number]  Insulin Pen Needle (B-D UF III MINI PEN NEEDLES) 31G X 5 MM MISC [id number]    2) Pharmacy of Choice: [facility] [place] [place] [facility] [address]   3) Special Requests:   Approved medications will be sent to the pharmacy, we will reach out if there is an issue.  Requests made after 3pm may not be addressed until the following business day!  If a patient is unsure of the name of the medication(s) please note and ask patient to call back when they are able to provide all info, do not send to responsible party until all information is available!

## 2019-06-09 MED ORDER — PRAVASTATIN SODIUM 40 MG PO TABS: 40.0000 mg | ORAL_TABLET | Freq: Every day | ORAL | 0 refills | Status: DC

## 2019-06-09 MED ORDER — BD PEN NEEDLE MINI U/F 31G X 5 MM MISC: 0 refills | Status: DC

## 2019-06-09 NOTE — Telephone Encounter (Signed)
Rx sent 

## 2019-06-15 ENCOUNTER — Ambulatory Visit

## 2019-07-04 ENCOUNTER — Encounter (HOSPITAL_COMMUNITY): Payer: Self-pay | Admitting: Emergency Medicine

## 2019-07-04 ENCOUNTER — Other Ambulatory Visit: Payer: Self-pay

## 2019-07-04 ENCOUNTER — Emergency Department (HOSPITAL_COMMUNITY)

## 2019-07-04 ENCOUNTER — Emergency Department (HOSPITAL_COMMUNITY)
Admission: EM | Admit: 2019-07-04 | Discharge: 2019-07-04 | Disposition: A | Discharge status: Home / Self Care | Attending: Emergency Medicine | Admitting: Emergency Medicine

## 2019-07-04 DIAGNOSIS — Z794 Long term (current) use of insulin: Secondary | ICD-10-CM | POA: Diagnosis not present

## 2019-07-04 DIAGNOSIS — E1165 Type 2 diabetes mellitus with hyperglycemia: Secondary | ICD-10-CM | POA: Insufficient documentation

## 2019-07-04 DIAGNOSIS — R0789 Other chest pain: Secondary | ICD-10-CM | POA: Insufficient documentation

## 2019-07-04 DIAGNOSIS — R55 Syncope and collapse: Secondary | ICD-10-CM

## 2019-07-04 DIAGNOSIS — R739 Hyperglycemia, unspecified: Secondary | ICD-10-CM

## 2019-07-04 DIAGNOSIS — R072 Precordial pain: Secondary | ICD-10-CM

## 2019-07-04 LAB — TROPONIN I (HIGH SENSITIVITY)
Troponin I (High Sensitivity): 4 ng/L (ref <18)
Troponin I (High Sensitivity): 4 ng/L (ref <18)

## 2019-07-04 LAB — BASIC METABOLIC PANEL
Anion gap: 13 (ref 5–15)
BUN: 15 mg/dL (ref 6–20)
CO2: 23 mmol/L (ref 22–32)
Calcium: 9.1 mg/dL (ref 8.9–10.3)
Chloride: 100 mmol/L (ref 98–111)
Creatinine, Ser: 0.83 mg/dL (ref 0.61–1.24)
GFR calc Af Amer: >60 mL/min (ref >60)
GFR calc non Af Amer: >60 mL/min (ref >60)
Glucose, Bld: 321 mg/dL — ABNORMAL HIGH (ref 70–99)
Potassium: 3.7 mmol/L (ref 3.5–5.1)
Sodium: 136 mmol/L (ref 135–145)

## 2019-07-04 LAB — CBC
HCT: 41.3 % (ref 39.0–52.0)
Hemoglobin: 15 g/dL (ref 13.0–17.0)
MCH: 30.7 pg (ref 26.0–34.0)
MCHC: 36.3 g/dL — ABNORMAL HIGH (ref 30.0–36.0)
MCV: 84.5 fL (ref 80.0–100.0)
Platelets: 210 10*3/uL (ref 150–400)
RBC: 4.89 MIL/uL (ref 4.22–5.81)
RDW: 12.4 % (ref 11.5–15.5)
WBC: 10.3 10*3/uL (ref 4.0–10.5)
nRBC: 0 % (ref 0.0–0.2)

## 2019-07-04 MED ORDER — SODIUM CHLORIDE 0.9% FLUSH: 3.0000 mL | Freq: Once | INTRAVENOUS | Status: DC

## 2019-07-04 NOTE — Discharge Instructions (Addendum)
It was our pleasure to provide your ER care today - we hope that you feel better.  For earlier symptoms, follow up with cardiologist in the next 1-2 weeks - call office to arrange appointment.   For high blood sugars/diabetes, continue diabetic medication, drink plenty of fluids/water, follow diabetic meal plan, check sugars and record values, and follow up with primary care doctor in the next 1-2 weeks.   Return to ER if worse, new symptoms, increased trouble breathing, recurrent or persistent chest pain, weak/fainting, or other concern.

## 2019-07-04 NOTE — ED Triage Notes (Signed)
Per EMS, pt from home, went to bed feeling fine, was woken up by "a weird feeling" including chest tightness, SOB during the episode, felt dizzy when he stood up and a hot flush feeling.  Pt went to the kitchen to check blood sugar, had a near syncope episode at which time his apple watch read his heart rate at 40.  He had a acute episode of loose stool but then felt better.  Did take 324 ASA from EMS.  No chest pain at the time, did not get nitro.    155/84 HR 76 RR 18 99% RA CBG 362

## 2019-07-04 NOTE — ED Provider Notes (Signed)
[facility] EMERGENCY DEPARTMENT Provider Note   CSN: [id number] Arrival date & time: 07/04/19  0241     History Chief Complaint  Patient presents with  . Chest Pain  . Near Syncope    DEVONAIRE SHINGLETON is a 49 y.o. male.  Patient w hx dm, c/o mid chest discomfort onset at rest last night. States was sleeping, and when awoke around 2 am had 'weird feeling', midline/lower chest. States difficult to describe, just felt strange. Vague dull/tight feeling, non radiating, at rest, not pleuritic. No associated sob or nv. No associated palpitations or sense of rapid, slow or fast heart beat. States got up quickly to check blood sugar, and felt hot, flushed, and then lightheaded feeling. Blood sugar was not low. States he later checked watch/hr monitor and it was 47. States then went to bathroom, had loose bm, and felt improved. No hx syncope or dysrhythmia. No hx cad or fam hx cad. States his father had afib. Patient states exercises regularly, and denies any other recent cp or discomfort, even w exertion. No other faintness or syncope. No unusual doe or sob.   The history is provided by the patient.  Chest Pain Associated symptoms: near-syncope   Associated symptoms: no abdominal pain, no back pain, no cough, no fever, no headache, no palpitations, no shortness of breath and no vomiting   Near Syncope Associated symptoms include chest pain. Pertinent negatives include no abdominal pain, no headaches and no shortness of breath.       Past Medical History:  Diagnosis Date  . Diabetes mellitus without complication ([place])   . Hyperlipidemia   . Kidney stones   . Sleep apnea    wears CPAP    Patient Active Problem List   Diagnosis Date Noted  . Erectile dysfunction 08/12/2016  . Polyp of colon 08/12/2016  . Obstructive sleep apnea 04/03/2015  . Onychomycosis of right great toe 04/03/2015  . Diabetes type 2, uncontrolled ([place] [facility]) 01/25/2014  . Dyslipidemia associated with  type 2 diabetes mellitus ([place]) 01/25/2014  . Skin tag 01/25/2014    Past Surgical History:  Procedure Laterality Date  . COLONOSCOPY    . KNEE ARTHROSCOPY Right   . UMBILICAL HERNIA REPAIR         Family History  Problem Relation Age of Onset  . Hypertension Father   . Diabetes Mother   . Sleep apnea Other   . Heart attack Maternal Grandfather   . Heart attack Paternal Grandfather   . Cancer Neg Hx   . Colon cancer Neg Hx   . Esophageal cancer Neg Hx   . Stomach cancer Neg Hx   . Rectal cancer Neg Hx     Social History   Tobacco Use  . Smoking status: Never Smoker  . Smokeless tobacco: Never Used  Substance Use Topics  . Alcohol use: Yes    Comment: Social  . Drug use: No    Home Medications Prior to Admission medications   Medication Sig Start Date End Date Taking? Authorizing Provider  dapagliflozin propanediol (FARXIGA) 5 MG TABS tablet Take 5 mg by mouth daily before breakfast. 03/25/19   Ladell Pier, MD  Insulin Glargine (LANTUS SOLOSTAR) 100 UNIT/ML Solostar Pen Lantus 40 units subcut daily. Increase in 2 unit increments Q3 days until a.m BS consistently between 90-130. 05/16/19   Ladell Pier, MD  Insulin Pen Needle (B-D UF III MINI PEN NEEDLES) 31G X 5 MM MISC Use to inject insulin daily. 06/09/19  Ladell Pier, MD  Insulin Syringe-Needle U-100 (B-D INS SYRINGE 0.5CC/31GX5/16) 31G X 5/16" 0.5 ML MISC 1 each by Does not apply route at bedtime. Patient not taking: Reported on 02/03/2017 01/19/17   Boykin Nearing, MD  metFORMIN (GLUCOPHAGE) 1000 MG tablet Take 1 tablet (1,000 mg total) by mouth 2 (two) times daily with a meal. 05/16/19   Ladell Pier, MD  pravastatin (PRAVACHOL) 40 MG tablet Take 1 tablet (40 mg total) by mouth daily. 06/09/19   Ladell Pier, MD  sitaGLIPtin (JANUVIA) 100 MG tablet Take 1 tablet (100 mg total) by mouth daily. 05/16/19   Ladell Pier, MD    Allergies    Codeine  Review of Systems   Review  of Systems  Constitutional: Negative for chills and fever.  HENT: Negative for sore throat.   Eyes: Negative for redness.  Respiratory: Negative for cough and shortness of breath.   Cardiovascular: Positive for chest pain and near-syncope. Negative for palpitations and leg swelling.  Gastrointestinal: Negative for abdominal pain, blood in stool and vomiting.  Genitourinary: Negative for flank pain.  Musculoskeletal: Negative for back pain and neck pain.  Skin: Negative for rash.  Neurological: Negative for headaches.  Hematological: Does not bruise/bleed easily.  Psychiatric/Behavioral: Negative for confusion.    Physical Exam Updated Vital Signs BP 129/84   Pulse 67   Temp 97.9 F (36.6 C) (Oral)   Resp 14   Ht 1.778 m (5\' 10" )   Wt 94.3 kg   SpO2 96%   BMI 29.84 kg/m   Physical Exam Vitals and nursing note reviewed.  Constitutional:      Appearance: Normal appearance. He is well-developed.  HENT:     Head: Atraumatic.     Nose: Nose normal.     Mouth/Throat:     Mouth: Mucous membranes are moist.     Pharynx: Oropharynx is clear.  Eyes:     General: No scleral icterus.    Conjunctiva/sclera: Conjunctivae normal.     Pupils: Pupils are equal, round, and reactive to light.  Neck:     Trachea: No tracheal deviation.  Cardiovascular:     Rate and Rhythm: Normal rate and regular rhythm.     Pulses: Normal pulses.     Heart sounds: Normal heart sounds. No murmur. No friction rub. No gallop.   Pulmonary:     Effort: Pulmonary effort is normal. No accessory muscle usage or respiratory distress.     Breath sounds: Normal breath sounds.  Abdominal:     General: Bowel sounds are normal. There is no distension.     Palpations: Abdomen is soft.     Tenderness: There is no abdominal tenderness. There is no guarding.  Genitourinary:    Comments: No cva tenderness. Musculoskeletal:        General: No swelling or tenderness.     Cervical back: Normal range of motion and  neck supple. No rigidity.     Right lower leg: No edema.     Left lower leg: No edema.  Skin:    General: Skin is warm and dry.     Findings: No rash.  Neurological:     Mental Status: He is alert.     Comments: Alert, speech clear. Steady gait.   Psychiatric:        Mood and Affect: Mood normal.     ED Results / Procedures / Treatments   Labs (all labs ordered are listed, but only abnormal results are displayed)  Results for orders placed or performed during the hospital encounter of [id number]  Basic metabolic panel  Result Value Ref Range   Sodium 136 135 - 145 mmol/L   Potassium 3.7 3.5 - 5.1 mmol/L   Chloride 100 98 - 111 mmol/L   CO2 23 22 - 32 mmol/L   Glucose, Bld 321 (H) 70 - 99 mg/dL   BUN 15 6 - 20 mg/dL   Creatinine, Ser 0.83 0.61 - 1.24 mg/dL   Calcium 9.1 8.9 - 10.3 mg/dL   GFR calc non Af Amer >60 >60 mL/min   GFR calc Af Amer >60 >60 mL/min   Anion gap 13 5 - 15  CBC  Result Value Ref Range   WBC 10.3 4.0 - 10.5 K/uL   RBC 4.89 4.22 - 5.81 MIL/uL   Hemoglobin 15.0 13.0 - 17.0 g/dL   HCT 41.3 39.0 - 52.0 %   MCV 84.5 80.0 - 100.0 fL   MCH 30.7 26.0 - 34.0 pg   MCHC 36.3 (H) 30.0 - 36.0 g/dL   RDW 12.4 11.5 - 15.5 %   Platelets 210 150 - 400 K/uL   nRBC 0.0 0.0 - 0.2 %  Troponin I (High Sensitivity)  Result Value Ref Range   Troponin I (High Sensitivity) 4 <18 ng/L  Troponin I (High Sensitivity)  Result Value Ref Range   Troponin I (High Sensitivity) 4 <18 ng/L   DG Chest Portable 1 View  Result Date: 07/04/2019 CLINICAL DATA:  Chest pain EXAM: PORTABLE CHEST 1 VIEW COMPARISON:  None. FINDINGS: The heart size and mediastinal contours are within normal limits. Both lungs are clear. The visualized skeletal structures are unremarkable. IMPRESSION: No active disease. Electronically Signed   By: Donavan Foil M.D.   On: 07/04/2019 03:42    EKG EKG Interpretation  Date/Time:  Monday July 04 2019 02:43:24 EST Ventricular Rate:  75 PR  Interval:  182 QRS Duration: 80 QT Interval:  390 QTC Calculation: 435 R Axis:   37 Text Interpretation: Normal sinus rhythm Normal ECG No old tracing to compare Confirmed by Delora Fuel ([id number]) on 07/04/2019 4:50:21 AM   Radiology DG Chest Portable 1 View  Result Date: 07/04/2019 CLINICAL DATA:  Chest pain EXAM: PORTABLE CHEST 1 VIEW COMPARISON:  None. FINDINGS: The heart size and mediastinal contours are within normal limits. Both lungs are clear. The visualized skeletal structures are unremarkable. IMPRESSION: No active disease. Electronically Signed   By: Donavan Foil M.D.   On: 07/04/2019 03:42    Procedures Procedures (including critical care time)  Medications Ordered in ED Medications  sodium chloride flush (NS) 0.9 % injection 3 mL (has no administration in time range)    ED Course  I have reviewed the triage vital signs and the nursing notes.  Pertinent labs & imaging results that were available during my care of the patient were reviewed by me and considered in my medical decision making (see chart for details).    MDM Rules/Calculators/A&P                     Iv ns. Continuous pulse ox and monitor. Ecg. Labs.   Reviewed nursing notes and prior charts for additional history.   Labs reviewed/interpreted by me - initial trop normal. Glucose elevated, hco3 normal.   CXR reviewed/interpreted by me - no pna.   Currently asymptomatic, no cp or discomfort. No sob. nsr on monitor.   Await delta troponin.  Additional labs reviewed/interpreted by me - delta  trop normal and not increasing. Symptoms/ED eval felt not c/w ACS.    Will rec cardiology outpt f/u. Also rec pcp f/u re hyperglycemia/dm.   Return precautions provided.       Final Clinical Impression(s) / ED Diagnoses Final diagnoses:  None    Rx / DC Orders ED Discharge Orders    None       Lajean Saver, MD 07/04/19 1220

## 2019-07-04 NOTE — ED Notes (Signed)
Patient verbalizes understanding of discharge instructions. Opportunity for questioning and answers were provided. Pt discharged from ED. 

## 2019-07-08 ENCOUNTER — Ambulatory Visit: Attending: Internal Medicine | Admitting: Internal Medicine

## 2019-07-08 ENCOUNTER — Telehealth: Payer: Self-pay | Admitting: Radiology

## 2019-07-08 ENCOUNTER — Telehealth: Payer: Self-pay

## 2019-07-08 ENCOUNTER — Other Ambulatory Visit: Payer: Self-pay

## 2019-07-08 ENCOUNTER — Telehealth (INDEPENDENT_AMBULATORY_CARE_PROVIDER_SITE_OTHER): Admitting: Cardiology

## 2019-07-08 ENCOUNTER — Encounter: Payer: Self-pay | Admitting: Cardiology

## 2019-07-08 VITALS — BP 126/63 | HR 57 | Ht 70.0 in | Wt 208.0 lb

## 2019-07-08 DIAGNOSIS — N529 Male erectile dysfunction, unspecified: Secondary | ICD-10-CM

## 2019-07-08 DIAGNOSIS — R55 Syncope and collapse: Secondary | ICD-10-CM

## 2019-07-08 DIAGNOSIS — E1169 Type 2 diabetes mellitus with other specified complication: Secondary | ICD-10-CM

## 2019-07-08 DIAGNOSIS — R079 Chest pain, unspecified: Secondary | ICD-10-CM

## 2019-07-08 DIAGNOSIS — E119 Type 2 diabetes mellitus without complications: Secondary | ICD-10-CM

## 2019-07-08 DIAGNOSIS — Z794 Long term (current) use of insulin: Secondary | ICD-10-CM | POA: Diagnosis not present

## 2019-07-08 DIAGNOSIS — R072 Precordial pain: Secondary | ICD-10-CM

## 2019-07-08 DIAGNOSIS — E785 Hyperlipidemia, unspecified: Secondary | ICD-10-CM

## 2019-07-08 NOTE — Progress Notes (Signed)
Virtual Visit via Telephone Note Due to current restrictions/limitations of in-office visits due to the COVID-19 pandemic, this scheduled clinical appointment was converted to a telehealth visit  I connected with Paul [name] on 07/08/19 at 11:21 a.m by telephone and verified that I am speaking with the correct person using two identifiers. I am in my office.  The patient is at home.  Only the patient, his wife and myself participated in this encounter.  I discussed the limitations, risks, security and privacy concerns of performing an evaluation and management service by telephone and the availability of in person appointments. I also discussed with the patient that there may be a patient responsible charge related to this service. The patient expressed understanding and agreed to proceed.   History of Present Illness: Hx of DM, HL, OSA on CPAP.  Patient was last seen 03/2019  Pt seen in ER 4 days ago for chest pressure.  Pt states he almost pass out.  Felt cold and clammy.  His apple watch showed pulse in 40s.  BS was 352.  In ER CXR negative, heart enzymes nl, CMP nl except for elev BS. No further episodes -no long distance travel in days prior to this episode Has appt with cardiologist later today.   DM: did not eat as healthy during the holidays.  Did not do as much exercise over the holidays but plans to get back to exercising Has not been checking BS consistently but plans to start doing so. He was not able to get the [place] prescribed on last visit.  Not approved by his insurance.  Currently on Metformin, Januvia and had increased Lantus to 46 units after recent ER visit as BS was high.  He wonders whether he needs to get back on Amaryl since he was not able to get the Farxiga  OSA:  Recently got new CPAP machine through the [place].  ED on Sildenafil 100 mg 1/2 tab PRN.  He has been on it for a couple mths.  Prescribed through [place]   Observations/Objective: Results for orders placed  or performed during the hospital encounter of [id number]  Basic metabolic panel  Result Value Ref Range   Sodium 136 135 - 145 mmol/L   Potassium 3.7 3.5 - 5.1 mmol/L   Chloride 100 98 - 111 mmol/L   CO2 23 22 - 32 mmol/L   Glucose, Bld 321 (H) 70 - 99 mg/dL   BUN 15 6 - 20 mg/dL   Creatinine, Ser 0.83 0.61 - 1.24 mg/dL   Calcium 9.1 8.9 - 10.3 mg/dL   GFR calc non Af Amer >60 >60 mL/min   GFR calc Af Amer >60 >60 mL/min   Anion gap 13 5 - 15  CBC  Result Value Ref Range   WBC 10.3 4.0 - 10.5 K/uL   RBC 4.89 4.22 - 5.81 MIL/uL   Hemoglobin 15.0 13.0 - 17.0 g/dL   HCT 41.3 39.0 - 52.0 %   MCV 84.5 80.0 - 100.0 fL   MCH 30.7 26.0 - 34.0 pg   MCHC 36.3 (H) 30.0 - 36.0 g/dL   RDW 12.4 11.5 - 15.5 %   Platelets 210 150 - 400 K/uL   nRBC 0.0 0.0 - 0.2 %  Troponin I (High Sensitivity)  Result Value Ref Range   Troponin I (High Sensitivity) 4 <18 ng/L  Troponin I (High Sensitivity)  Result Value Ref Range   Troponin I (High Sensitivity) 4 <18 ng/L     Assessment and Plan:  1. Near syncope With CP.  He has not had any recurrent episodes.  He has an appointment with cardiology today.  2. Type 2 diabetes mellitus without complication, with long-term current use of insulin ([place]) Encourage him to get back to healthy eating habits and regular exercise. Advised to check blood sugars daily and to keep a log.  We will have him follow-up with the clinical pharmacist in about 3 weeks for review.  A1C to be checked on that visit Message sent to clinical pharmacist to see if we can get prior approval for the for [place]. He will continue Lantus 46 units.  He had just recently increased it by 6 units.  Continue Metformin and Januvia.  3. Erectile dysfunction, unspecified erectile dysfunction type Viagra added to his med list.  This is prescribed for him through the [place].  I went over how Viagra works and possible side effects.  Advised to be seen in the emergency room if he has any sudden changes  in vision, hearing or an erection lasting longer than 2 hours.   Follow Up Instructions: Follow-up with clinical pharmacist in 3 weeks Follow-up with me in 2 months   I discussed the assessment and treatment plan with the patient. The patient was provided an opportunity to ask questions and all were answered. The patient agreed with the plan and demonstrated an understanding of the instructions.   The patient was advised to call back or seek an in-person evaluation if the symptoms worsen or if the condition fails to improve as anticipated.  I provided 23 minutes of non-face-to-face time during this encounter.   Karle Plumber, MD

## 2019-07-08 NOTE — Progress Notes (Signed)
Virtual Visit via Telephone Note   This visit type was conducted due to national recommendations for restrictions regarding the COVID-19 Pandemic (e.g. social distancing) in an effort to limit this patient's exposure and mitigate transmission in our community.  Due to his co-morbid illnesses, this patient is at least at moderate risk for complications without adequate follow up.  This format is felt to be most appropriate for this patient at this time.  The patient did not have access to video technology/had technical difficulties with video requiring transitioning to audio format only (telephone).  All issues noted in this document were discussed and addressed.  No physical exam could be performed with this format.  Please refer to the patient's chart for his  consent to telehealth for [facility].   Evaluation Performed:  Follow-up visit  This visit type was conducted due to national recommendations for restrictions regarding the COVID-19 Pandemic (e.g. social distancing).  This format is felt to be most appropriate for this patient at this time.  All issues noted in this document were discussed and addressed.  No physical exam was performed (except for noted visual exam findings with Video Visits).  Please refer to the patient's chart (MyChart message for video visits and phone note for telephone visits) for the patient's consent to telehealth for [facility].  Date:  07/08/2019   ID:  Paul [name], DOB June 23, 1971, MRN [id number]  Patient Location:  [place]  Provider location:   [place]  PCP:  Ladell Pier, MD  Cardiologist:  Fransico Him, MD ([place]) Electrophysiologist:  None   Chief Complaint:  Chest pain   History of Present Illness:    Paul [name] is a 49 y.o. male who presents via audio/video conferencing for a telehealth visit today in referral by Karle Plumber, MD for evaluation of chest pain and syncope  This is a 49yo male with a hx of DM, HLD and OSA  on CPAP.  He was recently seen in the ER with complaint of chest pain that started the night before while at rest.  He awakened about 2am with a strange sensation in middle of his chest that was a pressure sensation that was very uncomfortable.  There was no radiation of the sensation and no associated SOB, Nausea but he did have diaphoresis.  He decided to get up and check his BS and when he got up he suddenly felt flushed and lightheaded.  According to his wife he started to fall into the stove and his wife grabbed him and sat him down in the chair and he became pale.  He never completely passed out.  He checked his apple watch when he woke up and his HR was in the 40's.  He denied any palpitations.  In ER EKG was normal with normal QTc and labs were normal.  Trop was 4 x 2.  He does not smoke and has no fm hx of CAD but his Dad had afib and died a few weeks after an afib ablation.  He exercises frequently with no CP or SOB.    Since being seen in the ER he has not had any further symptoms.  He denies any PND, DOE, orthopnea, LE edema, dizziness, palpitations or syncope.  He has noticed in the past week that he has a strange discomfort in his chest at rest but when he gets up to do something like exercise, he does not feel the discomfort. He has not had any SOB, LE edema, PND,  orthopnea.    The patient does not have symptoms concerning for COVID-19 infection (fever, chills, cough, or new shortness of breath).   Prior CV studies:   The following studies were reviewed today:  EKG and labs from ER visit  Past Medical History:  Diagnosis Date  . Diabetes mellitus without complication ([place])   . Hyperlipidemia   . Kidney stones   . Sleep apnea    wears CPAP   Past Surgical History:  Procedure Laterality Date  . COLONOSCOPY    . KNEE ARTHROSCOPY Right   . UMBILICAL HERNIA REPAIR       Current Meds  Medication Sig  . Insulin Glargine (LANTUS SOLOSTAR) 100 UNIT/ML Solostar Pen Lantus 40 units  subcut daily. Increase in 2 unit increments Q3 days until a.m BS consistently between 90-130.  Marland Kitchen Insulin Pen Needle (B-D UF III MINI PEN NEEDLES) 31G X 5 MM MISC Use to inject insulin daily.  . Insulin Syringe-Needle U-100 (B-D INS SYRINGE 0.5CC/31GX5/16) 31G X 5/16" 0.5 ML MISC 1 each by Does not apply route at bedtime.  . metFORMIN (GLUCOPHAGE) 1000 MG tablet Take 1 tablet (1,000 mg total) by mouth 2 (two) times daily with a meal.  . pravastatin (PRAVACHOL) 40 MG tablet Take 1 tablet (40 mg total) by mouth daily.  . sildenafil (VIAGRA) 50 MG tablet Take 50 mg by mouth daily as needed for erectile dysfunction.  . sitaGLIPtin (JANUVIA) 100 MG tablet Take 1 tablet (100 mg total) by mouth daily.     Allergies:   Codeine   Social History   Tobacco Use  . Smoking status: Never Smoker  . Smokeless tobacco: Never Used  Substance Use Topics  . Alcohol use: Yes    Comment: Social  . Drug use: No     Family Hx: The patient's family history includes Diabetes in his mother; Heart attack in his maternal grandfather and paternal grandfather; Hypertension in his father; Sleep apnea in an other family member. There is no history of Cancer, Colon cancer, Esophageal cancer, Stomach cancer, or Rectal cancer.  ROS:   Please see the history of present illness.     All other systems reviewed and are negative.   Labs/Other Tests and Data Reviewed:    Recent Labs: 12/29/2018: ALT 17 07/04/2019: BUN 15; Creatinine, Ser 0.83; Hemoglobin 15.0; Platelets 210; Potassium 3.7; Sodium 136   Recent Lipid Panel Lab Results  Component Value Date/Time   CHOL 145 12/29/2018 01:53 PM   TRIG 77 12/29/2018 01:53 PM   HDL 39 (L) 12/29/2018 01:53 PM   CHOLHDL 3.7 12/29/2018 01:53 PM   CHOLHDL 4.9 08/12/2016 05:04 PM   LDLCALC 91 12/29/2018 01:53 PM    Wt Readings from Last 3 Encounters:  07/08/19 208 lb (94.3 kg)  07/04/19 208 lb (94.3 kg)  03/25/19 206 lb (93.4 kg)     Objective:    Vital Signs:  BP  126/63   Pulse (!) 57   Ht 5\' 10"  (1.778 m)   Wt 208 lb (94.3 kg)   BMI 29.84 kg/m     ASSESSMENT & PLAN:    1.  Chest pain -pain atypical in that it awakened him from sleep -he exercises without any CP or DOE -EKG is normal -he does have CRFs including DM, HTN and HLD -recommend coronary CTA to evaluate for underlying CAD  2.  HLD -LDL goal < 70 given DM -continue Pravastatin 40mg  daily  3.  DM -followed by PCP -continue Metformin 1000mg  BID, Januvia  100mg  daily and Lantus Insulin  4.  Presyncope -he had an episode of what sounds like a vagal episode possibly triggered by pain and getting out of bed quickly -recommend 2D echo to assess LVF and valvular disease -check 30 day event monitor to rule out arrhythmia  COVID-19 Education: The signs and symptoms of COVID-19 were discussed with the patient and how to seek care for testing (follow up with PCP or arrange E-visit).  The importance of social distancing was discussed today.  Patient Risk:   After full review of this patient's clinical status, I feel that they are at least moderate risk at this time.  Time:   Today, I have spent 20 minutes on telemedicine discussing medical problems including Chest pain and HLD.  We also reviewed the symptoms of COVID 19 and the ways to protect against contracting the virus with telehealth technology.  I spent an additional 5 minutes reviewing patient's chart including labs, EKG.  Medication Adjustments/Labs and Tests Ordered: Current medicines are reviewed at length with the patient today.  Concerns regarding medicines are outlined above.  Tests Ordered: No orders of the defined types were placed in this encounter.  Medication Changes: No orders of the defined types were placed in this encounter.   Disposition:  Follow up prn  Signed, Fransico Him, MD  07/08/2019 2:07 PM    [place]

## 2019-07-08 NOTE — Telephone Encounter (Signed)
YOUR CARDIOLOGY TEAM HAS ARRANGED FOR AN E-VISIT FOR YOUR APPOINTMENT - PLEASE REVIEW IMPORTANT INFORMATION BELOW SEVERAL DAYS PRIOR TO YOUR APPOINTMENT  Due to the recent COVID-19 pandemic, we are transitioning in-person office visits to tele-medicine visits in an effort to decrease unnecessary exposure to our patients, their families, and staff. These visits are billed to your insurance just like a normal visit is. We also encourage you to sign up for MyChart if you have not already done so. You will need a smartphone if possible. For patients that do not have this, we can still complete the visit using a regular telephone but do prefer a smartphone to enable video when possible. You may have a family member that lives with you that can help. If possible, we also ask that you have a blood pressure cuff and scale at home to measure your blood pressure, heart rate and weight prior to your scheduled appointment. Patients with clinical needs that need an in-person evaluation and testing will still be able to come to the office if absolutely necessary. If you have any questions, feel free to call our office.   THE DAY OF YOUR APPOINTMENT  Approximately 15 minutes prior to your scheduled appointment, you will receive a telephone call from one of [facility] team - your caller ID may say "Unknown caller."  Our staff will confirm medications, vital signs for the day and any symptoms you may be experiencing. Please have this information available prior to the time of visit start. It may also be helpful for you to have a pad of paper and pen handy for any instructions given during your visit. They will also walk you through joining the smartphone meeting if this is a video visit.    CONSENT FOR TELE-HEALTH VISIT - PLEASE REVIEW  I hereby voluntarily request, consent and authorize [facility] and its employed or contracted physicians, physician assistants, nurse practitioners or other licensed health care  professionals (the Practitioner), to provide me with telemedicine health care services (the "Services") as deemed necessary by the treating Practitioner. I acknowledge and consent to receive the Services by the Practitioner via telemedicine. I understand that the telemedicine visit will involve communicating with the Practitioner through live audiovisual communication technology and the disclosure of certain medical information by electronic transmission. I acknowledge that I have been given the opportunity to request an in-person assessment or other available alternative prior to the telemedicine visit and am voluntarily participating in the telemedicine visit.  I understand that I have the right to withhold or withdraw my consent to the use of telemedicine in the course of my care at any time, without affecting my right to future care or treatment, and that the Practitioner or I may terminate the telemedicine visit at any time. I understand that I have the right to inspect all information obtained and/or recorded in the course of the telemedicine visit and may receive copies of available information for a reasonable fee.  I understand that some of the potential risks of receiving the Services via telemedicine include:  Marland Kitchen Delay or interruption in medical evaluation due to technological equipment failure or disruption; . Information transmitted may not be sufficient (e.g. poor resolution of images) to allow for appropriate medical decision making by the Practitioner; and/or  . In rare instances, security protocols could fail, causing a breach of personal health information.  Furthermore, I acknowledge that it is my responsibility to provide information about my medical history, conditions and care that is complete and  accurate to the best of my ability. I acknowledge that Practitioner's advice, recommendations, and/or decision may be based on factors not within their control, such as incomplete or inaccurate  data provided by me or distortions of diagnostic images or specimens that may result from electronic transmissions. I understand that the practice of medicine is not an exact science and that Practitioner makes no warranties or guarantees regarding treatment outcomes. I acknowledge that I will receive a copy of this consent concurrently upon execution via email to the email address I last provided but may also request a printed copy by calling the office of [place].    I understand that my insurance will be billed for this visit.   I have read or had this consent read to me. . I understand the contents of this consent, which adequately explains the benefits and risks of the Services being provided via telemedicine.  . I have been provided ample opportunity to ask questions regarding this consent and the Services and have had my questions answered to my satisfaction. . I give my informed consent for the services to be provided through the use of telemedicine in my medical care  By participating in this telemedicine visit I agree to the above.

## 2019-07-08 NOTE — Patient Instructions (Addendum)
Medication Instructions:  Your physician recommends that you continue on your current medications as directed. Please refer to the Current Medication list given to you today.  *If you need a refill on your cardiac medications before your next appointment, please call your pharmacy*  Lab Work: BMET prior to CT scan   If you have labs (blood work) drawn today and your tests are completely normal, you will receive your results only by: Marland Kitchen MyChart Message (if you have MyChart) OR . A paper copy in the mail If you have any lab test that is abnormal or we need to change your treatment, we will call you to review the results.  Testing/Procedures: Your physician has requested that you have an echocardiogram. Echocardiography is a painless test that uses sound waves to create images of your heart. It provides your doctor with information about the size and shape of your heart and how well your heart's chambers and valves are working. This procedure takes approximately one hour. There are no restrictions for this procedure.  Your physician has recommended that you wear an event monitor. Event monitors are medical devices that record the heart's electrical activity. Doctors most often [place] these monitors to diagnose arrhythmias. Arrhythmias are problems with the speed or rhythm of the heartbeat. The monitor is a small, portable device. You can wear one while you do your normal daily activities. This is usually used to diagnose what is causing palpitations/syncope (passing out).  Your physician has requested that you have cardiac CT. Cardiac computed tomography (CT) is a painless test that uses an x-ray machine to take clear, detailed pictures of your heart. For further information please visit [facility]uge[employer]tn. Please follow instruction sheet as given.  Follow-Up: At [facility], you and your health needs are our priority.  As part of our continuing mission to provide you with exceptional heart care, we  have created designated Provider Care Teams.  These Care Teams include your primary Cardiologist (physician) and Advanced Practice Providers (APPs -  Physician Assistants and Nurse Practitioners) who all work together to provide you with the care you need, when you need it.  Follow up as needed with Dr. Radford Pax based on results from testing.   Other Instructions Your cardiac CT will be scheduled at one of the below locations:   [facility] [address] [place], [place] [address] [phone]  Please arrive at the [facility] main entrance of [facility] 30-45 minutes prior to test start time. Proceed to the [facility] Radiology Department (first floor) to check-in and test prep.   Please follow these instructions carefully (unless otherwise directed):  Hold all erectile dysfunction medications at least 3 days (72 hrs) prior to test.  On the Night Before the Test: . Be sure to Drink plenty of water. . Do not consume any caffeinated/decaffeinated beverages or chocolate 12 hours prior to your test. . Do not take any antihistamines 12 hours prior to your test.  On the Day of the Test: . Drink plenty of water. Do not drink any water within one hour of the test. . Do not eat any food 4 hours prior to the test. . You may take your regular medications prior to the test.  . Take metoprolol (Lopressor) two hours prior to test.  After the Test: . Drink plenty of water. . After receiving IV contrast, you may experience a mild flushed feeling. This is normal. . On occasion, you may experience a mild rash up to 24  hours after the test. This is not dangerous. If this occurs, you can take Benadryl 25 mg and increase your fluid intake. . If you experience trouble breathing, this can be serious. If it is severe call 911 IMMEDIATELY. If it is mild, please call our office. . If you take any of these medications: Glipizide/Metformin, Avandament, Glucavance, please do not take 48  hours after completing test unless otherwise instructed.   Once we have confirmed authorization from your insurance company, we will call you to set up a date and time for your test.   For non-scheduling related questions, please contact the cardiac imaging nurse navigator should you have any questions/concerns: Marchia Bond, RN Navigator Cardiac Imaging [facility] [phone] Office

## 2019-07-08 NOTE — Telephone Encounter (Signed)
Enrolled patient for a 30 Day [employer] Event monitor to be mailed to patients home.

## 2019-07-12 ENCOUNTER — Other Ambulatory Visit: Payer: Self-pay | Admitting: Internal Medicine

## 2019-07-12 MED ORDER — FARXIGA 5 MG PO TABS: 5.0000 mg | ORAL_TABLET | Freq: Every day | ORAL | 6 refills | Status: DC

## 2019-07-17 ENCOUNTER — Ambulatory Visit (INDEPENDENT_AMBULATORY_CARE_PROVIDER_SITE_OTHER)

## 2019-07-17 DIAGNOSIS — R55 Syncope and collapse: Secondary | ICD-10-CM | POA: Diagnosis not present

## 2019-07-17 DIAGNOSIS — R072 Precordial pain: Secondary | ICD-10-CM | POA: Diagnosis not present

## 2019-07-20 ENCOUNTER — Telehealth: Payer: Self-pay | Admitting: Internal Medicine

## 2019-07-20 NOTE — Telephone Encounter (Signed)
-----   Message from Rickey Barbara, CPhT sent at 07/20/2019  3:30 PM EST ----- PA was approved thru 06/29/2098 per [employer] rep.  Called [facility] and they are filling [place] for him now. Left voicemail for pt 3:31pm.  ----- Message ----- From: Ladell Pier, MD Sent: 07/08/2019   8:15 PM EST To: Tresa Endo, RPH-CPP, #  Can we try to get prior approval on Farxiga for him?  Thanks

## 2019-07-22 ENCOUNTER — Other Ambulatory Visit: Admitting: *Deleted

## 2019-07-22 ENCOUNTER — Other Ambulatory Visit: Payer: Self-pay

## 2019-07-22 ENCOUNTER — Other Ambulatory Visit (HOSPITAL_COMMUNITY)

## 2019-07-22 ENCOUNTER — Ambulatory Visit (HOSPITAL_COMMUNITY): Attending: Cardiology

## 2019-07-22 DIAGNOSIS — E1169 Type 2 diabetes mellitus with other specified complication: Secondary | ICD-10-CM | POA: Insufficient documentation

## 2019-07-22 DIAGNOSIS — R072 Precordial pain: Secondary | ICD-10-CM | POA: Diagnosis present

## 2019-07-22 DIAGNOSIS — R55 Syncope and collapse: Secondary | ICD-10-CM

## 2019-07-22 DIAGNOSIS — E785 Hyperlipidemia, unspecified: Secondary | ICD-10-CM

## 2019-07-22 DIAGNOSIS — E119 Type 2 diabetes mellitus without complications: Secondary | ICD-10-CM

## 2019-07-22 DIAGNOSIS — R079 Chest pain, unspecified: Secondary | ICD-10-CM

## 2019-07-23 LAB — BASIC METABOLIC PANEL
BUN/Creatinine Ratio: 20 (ref 9–20)
BUN: 17 mg/dL (ref 6–24)
CO2: 25 mmol/L (ref 20–29)
Calcium: 9.8 mg/dL (ref 8.7–10.2)
Chloride: 103 mmol/L (ref 96–106)
Creatinine, Ser: 0.85 mg/dL (ref 0.76–1.27)
GFR calc Af Amer: 117 mL/min/{1.73_m2} (ref >59)
GFR calc non Af Amer: 102 mL/min/{1.73_m2} (ref >59)
Glucose: 100 mg/dL — ABNORMAL HIGH (ref 65–99)
Potassium: 4.3 mmol/L (ref 3.5–5.2)
Sodium: 146 mmol/L — ABNORMAL HIGH (ref 134–144)

## 2019-07-25 ENCOUNTER — Other Ambulatory Visit

## 2019-07-25 ENCOUNTER — Other Ambulatory Visit (HOSPITAL_COMMUNITY)

## 2019-08-03 ENCOUNTER — Telehealth: Payer: Self-pay

## 2019-08-03 ENCOUNTER — Other Ambulatory Visit: Payer: Self-pay

## 2019-08-03 DIAGNOSIS — I472 Ventricular tachycardia, unspecified: Secondary | ICD-10-CM

## 2019-08-03 NOTE — Telephone Encounter (Signed)
LMTCB regarding critical cardiac report from [employer]. Pt had 8 beat run of VTach

## 2019-08-08 ENCOUNTER — Other Ambulatory Visit: Payer: Self-pay | Admitting: Internal Medicine

## 2019-08-08 DIAGNOSIS — E119 Type 2 diabetes mellitus without complications: Secondary | ICD-10-CM

## 2019-08-09 ENCOUNTER — Other Ambulatory Visit: Payer: Self-pay | Admitting: Internal Medicine

## 2019-08-09 DIAGNOSIS — E119 Type 2 diabetes mellitus without complications: Secondary | ICD-10-CM

## 2019-08-17 ENCOUNTER — Encounter (HOSPITAL_COMMUNITY): Payer: Self-pay

## 2019-08-18 ENCOUNTER — Telehealth (HOSPITAL_COMMUNITY): Payer: Self-pay | Admitting: Emergency Medicine

## 2019-08-18 NOTE — Telephone Encounter (Signed)
Left message on voicemail with name and callback number Karanveer Ramakrishnan RN Navigator Cardiac Imaging [facility] [phone] Office [phone] Cell  

## 2019-08-19 ENCOUNTER — Ambulatory Visit (HOSPITAL_COMMUNITY)
Admission: RE | Admit: 2019-08-19 | Discharge: 2019-08-19 | Disposition: A | Discharge status: Home / Self Care | Source: Ambulatory Visit | Attending: Cardiology | Admitting: Cardiology

## 2019-08-19 ENCOUNTER — Other Ambulatory Visit: Payer: Self-pay

## 2019-08-19 ENCOUNTER — Encounter (HOSPITAL_COMMUNITY): Payer: Self-pay

## 2019-08-19 DIAGNOSIS — R072 Precordial pain: Secondary | ICD-10-CM | POA: Diagnosis not present

## 2019-08-19 MED ORDER — METOPROLOL TARTRATE 5 MG/5ML IV SOLN
INTRAVENOUS | Status: AC
  Administered 2019-08-19: 5 mg via INTRAVENOUS
  Filled 2019-08-19: qty 15

## 2019-08-19 MED ORDER — NITROGLYCERIN 0.4 MG SL SUBL
0.8000 mg | SUBLINGUAL_TABLET | Freq: Once | SUBLINGUAL | Status: AC
  Administered 2019-08-19: 0.8 mg via SUBLINGUAL

## 2019-08-19 MED ORDER — NITROGLYCERIN 0.4 MG SL SUBL
SUBLINGUAL_TABLET | SUBLINGUAL | Status: AC
  Filled 2019-08-19: qty 2

## 2019-08-19 MED ORDER — METOPROLOL TARTRATE 5 MG/5ML IV SOLN: 5.0000 mg | INTRAVENOUS | Status: DC | PRN

## 2019-08-19 MED ORDER — IOHEXOL 350 MG/ML SOLN
80.0000 mL | Freq: Once | INTRAVENOUS | Status: AC | PRN
  Administered 2019-08-19: 80 mL via INTRAVENOUS

## 2019-08-21 ENCOUNTER — Other Ambulatory Visit: Payer: Self-pay | Admitting: Internal Medicine

## 2019-08-21 DIAGNOSIS — R072 Precordial pain: Secondary | ICD-10-CM | POA: Diagnosis not present

## 2019-08-21 DIAGNOSIS — E119 Type 2 diabetes mellitus without complications: Secondary | ICD-10-CM

## 2019-08-22 ENCOUNTER — Telehealth: Payer: Self-pay

## 2019-08-22 DIAGNOSIS — E1169 Type 2 diabetes mellitus with other specified complication: Secondary | ICD-10-CM

## 2019-08-22 MED ORDER — ASPIRIN EC 81 MG PO TBEC: 81.0000 mg | DELAYED_RELEASE_TABLET | Freq: Every day | ORAL | 3 refills | Status: AC

## 2019-08-22 NOTE — Telephone Encounter (Signed)
The patient has been notified of the result and verbalized understanding.  All questions (if any) were answered. Antonieta Iba, RN 08/22/2019 12:38 PM   He is scheduled for a virtual visit with Dr. Radford Pax on 02/24 to discuss results further.

## 2019-08-22 NOTE — Telephone Encounter (Signed)
-----   Message from Sueanne Margarita, MD sent at 08/19/2019  8:29 PM EST ----- Please let patient know that he has a small amount of calcium in his coronary artery called the LAD.  There is some concern for non calcified plaque in the LAD and a branch off of this that may be around 50-60% narrowed.  The study has been sent for blood flow analysis to determined if the narrowing is significant to limit blood flow.  Please have patient come in for fasting lipids.  He needs to start ASA 81mg  daily. Will let him know when FFR analysis is back.

## 2019-08-23 NOTE — H&P (View-Only) (Signed)
Virtual Visit via Telephone Note   This visit type was conducted due to national recommendations for restrictions regarding the COVID-19 Pandemic (e.g. social distancing) in an effort to limit this patient's exposure and mitigate transmission in our community.  Due to his co-morbid illnesses, this patient is at least at moderate risk for complications without adequate follow up.  This format is felt to be most appropriate for this patient at this time.  All issues noted in this document were discussed and addressed.  A limited physical exam was performed with this format.  Please refer to the patient's chart for his consent to telehealth for [facility].   Evaluation Performed:  Follow-up visit  This visit type was conducted due to national recommendations for restrictions regarding the COVID-19 Pandemic (e.g. social distancing).  This format is felt to be most appropriate for this patient at this time.  All issues noted in this document were discussed and addressed.  No physical exam was performed (except for noted visual exam findings with Video Visits).  Please refer to the patient's chart (MyChart message for video visits and phone note for telephone visits) for the patient's consent to telehealth for [facility].  Date:  08/24/2019   ID:  Paul [name], DOB 10/05/1970, MRN [id number]  Patient Location:  Home  Provider location:   [place]  PCP:  Ladell Pier, MD  Cardiologist:  Fransico Him, MD  Electrophysiologist:  None   Chief Complaint:  Chest pain  History of Present Illness:    Paul [name] is a 49 y.o. male who presents via audio/video conferencing for a telehealth visit today.    This is a 49yo male with a hx of DM, HLD and OSA on CPAP.  He was recently seen in the ER with complaint of chest pain that started the night before while at rest.  He awakened about 2am with a strange sensation in middle of his chest that was a pressure sensation that was  very uncomfortable.  There was no radiation of the sensation and no associated SOB, Nausea but he did have diaphoresis.  He decided to get up and check his BS and when he got up he suddenly felt flushed and lightheaded.  According to his wife he started to fall into the stove and his wife grabbed him and sat him down in the chair and he became pale.  He never completely passed out.  He checked his apple watch when he woke up and his HR was in the 40's.  He denied any palpitations.  In ER EKG was normal with normal QTc and labs were normal.  Trop was 4 x 2.  He does not smoke and has no fm hx of CAD but his Dad had afib and died a few weeks after an afib ablation.  He exercises frequently with no CP or SOB.    He underwent coronary CTA which showed a calcium score of 8 and moderate atherosclerosis of the mid to distal LAD and D1. FFR showed possible flow limiting lesions in the LAD and D1.  He is now back today to discuss results of his studies.    The patient does not have symptoms concerning for COVID-19 infection (fever, chills, cough, or new shortness of breath).    Prior CV studies:   The following studies were reviewed today:  Coronary CTA with FFR, 2D echo  Past Medical History:  Diagnosis Date  . Diabetes mellitus without complication ([place])   .  Hyperlipidemia   . Kidney stones   . Sleep apnea    wears CPAP   Past Surgical History:  Procedure Laterality Date  . COLONOSCOPY    . KNEE ARTHROSCOPY Right   . UMBILICAL HERNIA REPAIR       Current Meds  Medication Sig  . aspirin EC 81 MG tablet Take 1 tablet (81 mg total) by mouth daily.  . Insulin Glargine (LANTUS SOLOSTAR) 100 UNIT/ML Solostar Pen INJECT 40 UNITS UNDER THE SKIN DAILY. INCREASE IN 2 UNIT INCREMENTS EVERY 3 DAYS UNTIL MORNING BLOOD SUGAR CONSISTENTLY BETWEEN 90-130  . Insulin Pen Needle (B-D UF III MINI PEN NEEDLES) 31G X 5 MM MISC Use to inject insulin daily.  . Insulin Syringe-Needle U-100 (B-D INS SYRINGE  0.5CC/31GX5/16) 31G X 5/16" 0.5 ML MISC 1 each by Does not apply route at bedtime.  Marland Kitchen JANUVIA 100 MG tablet TAKE 1 TABLET BY MOUTH EVERY DAY  . metFORMIN (GLUCOPHAGE) 1000 MG tablet TAKE 1 TABLET (1,000 MG TOTAL) BY MOUTH 2 (TWO) TIMES DAILY WITH A MEAL.  . pravastatin (PRAVACHOL) 40 MG tablet Take 1 tablet (40 mg total) by mouth daily.  . sildenafil (VIAGRA) 50 MG tablet Take 50 mg by mouth daily as needed for erectile dysfunction.     Allergies:   Codeine   Social History   Tobacco Use  . Smoking status: Never Smoker  . Smokeless tobacco: Never Used  Substance Use Topics  . Alcohol use: Yes    Comment: Social  . Drug use: No     Family Hx: The patient's family history includes Diabetes in his mother; Heart attack in his maternal grandfather and paternal grandfather; Hypertension in his father; Sleep apnea in an other family member. There is no history of Cancer, Colon cancer, Esophageal cancer, Stomach cancer, or Rectal cancer.  ROS:   Please see the history of present illness.     All other systems reviewed and are negative.   Labs/Other Tests and Data Reviewed:    Recent Labs: 12/29/2018: ALT 17 07/04/2019: Hemoglobin 15.0; Platelets 210 07/22/2019: BUN 17; Creatinine, Ser 0.85; Potassium 4.3; Sodium 146   Recent Lipid Panel Lab Results  Component Value Date/Time   CHOL 145 12/29/2018 01:53 PM   TRIG 77 12/29/2018 01:53 PM   HDL 39 (L) 12/29/2018 01:53 PM   CHOLHDL 3.7 12/29/2018 01:53 PM   CHOLHDL 4.9 08/12/2016 05:04 PM   LDLCALC 91 12/29/2018 01:53 PM    Wt Readings from Last 3 Encounters:  08/24/19 208 lb (94.3 kg)  07/08/19 208 lb (94.3 kg)  07/04/19 208 lb (94.3 kg)     Objective:    Vital Signs:  Pulse 71   Ht 5\' 10"  (1.778 m)   Wt 208 lb (94.3 kg)   BMI 29.84 kg/m     ASSESSMENT & PLAN:    1.  Chest pain -coronary CTA showed possible flow limiting noncalcified plaque in the LAD and D1 -results of coronary CTA reviewed with patient  -recommend  proceeding with cardiac cath to define coronary anatomy given his marked event of chest pressure with near syncope and findings on FFR -continue statin and ASA -Cardiac catheterization was discussed with the patient fully. The patient understands that risks include but are not limited to stroke (1 in 1000), death (1 in 78), kidney failure [usually temporary] (1 in 500), bleeding (1 in 200), allergic reaction [possibly serious] (1 in 200).  The patient understands and is willing to proceed.    2.  HLD -  LDL goal < 70  -LDL was 91 on review of outside labs from PCP  -increase pravastatin 80mg  daily - he has not missed any doses -repeat FLP and ALT in 6 weeks  3.  DM2 -followed by PCP -continue Insulin, metformin 1000mg  BID and Januvia 100mg  daily  COVID-19 Education: The signs and symptoms of COVID-19 were discussed with the patient and how to seek care for testing (follow up with PCP or arrange E-visit).  The importance of social distancing was discussed today.  Patient Risk:   After full review of this patient's clinical status, I feel that they are at least moderate risk at this time.  Time:   Today, I have spent 20 minutes on telemedicine discussing medical problems including chest pain, CAD, HLD, DM2 and reviewing patient's chart including coronary CTA, 2D echo, outside labs from PCP.  Medication Adjustments/Labs and Tests Ordered: Current medicines are reviewed at length with the patient today.  Concerns regarding medicines are outlined above.  Tests Ordered: No orders of the defined types were placed in this encounter.  Medication Changes: No orders of the defined types were placed in this encounter.   Disposition:  Follow up after cath  Signed, Fransico Him, MD  08/24/2019 8:50 AM    [place]

## 2019-08-23 NOTE — Progress Notes (Signed)
Virtual Visit via Telephone Note   This visit type was conducted due to national recommendations for restrictions regarding the COVID-19 Pandemic (e.g. social distancing) in an effort to limit this patient's exposure and mitigate transmission in our community.  Due to his co-morbid illnesses, this patient is at least at moderate risk for complications without adequate follow up.  This format is felt to be most appropriate for this patient at this time.  All issues noted in this document were discussed and addressed.  A limited physical exam was performed with this format.  Please refer to the patient's chart for his consent to telehealth for [facility].   Evaluation Performed:  Follow-up visit  This visit type was conducted due to national recommendations for restrictions regarding the COVID-19 Pandemic (e.g. social distancing).  This format is felt to be most appropriate for this patient at this time.  All issues noted in this document were discussed and addressed.  No physical exam was performed (except for noted visual exam findings with Video Visits).  Please refer to the patient's chart (MyChart message for video visits and phone note for telephone visits) for the patient's consent to telehealth for [facility].  Date:  08/24/2019   ID:  Paul [name], DOB May 19, 1971, MRN [id number]  Patient Location:  Home  Provider location:   [place]  PCP:  Ladell Pier, MD  Cardiologist:  Fransico Him, MD  Electrophysiologist:  None   Chief Complaint:  Chest pain  History of Present Illness:    Paul [name] is a 49 y.o. male who presents via audio/video conferencing for a telehealth visit today.    This is a 49yo male with a hx of DM, HLD and OSA on CPAP.  He was recently seen in the ER with complaint of chest pain that started the night before while at rest.  He awakened about 2am with a strange sensation in middle of his chest that was a pressure sensation that was  very uncomfortable.  There was no radiation of the sensation and no associated SOB, Nausea but he did have diaphoresis.  He decided to get up and check his BS and when he got up he suddenly felt flushed and lightheaded.  According to his wife he started to fall into the stove and his wife grabbed him and sat him down in the chair and he became pale.  He never completely passed out.  He checked his apple watch when he woke up and his HR was in the 40's.  He denied any palpitations.  In ER EKG was normal with normal QTc and labs were normal.  Trop was 4 x 2.  He does not smoke and has no fm hx of CAD but his Dad had afib and died a few weeks after an afib ablation.  He exercises frequently with no CP or SOB.    He underwent coronary CTA which showed a calcium score of 8 and moderate atherosclerosis of the mid to distal LAD and D1. FFR showed possible flow limiting lesions in the LAD and D1.  He is now back today to discuss results of his studies.    The patient does not have symptoms concerning for COVID-19 infection (fever, chills, cough, or new shortness of breath).    Prior CV studies:   The following studies were reviewed today:  Coronary CTA with FFR, 2D echo  Past Medical History:  Diagnosis Date  . Diabetes mellitus without complication ([place])   .  Hyperlipidemia   . Kidney stones   . Sleep apnea    wears CPAP   Past Surgical History:  Procedure Laterality Date  . COLONOSCOPY    . KNEE ARTHROSCOPY Right   . UMBILICAL HERNIA REPAIR       Current Meds  Medication Sig  . aspirin EC 81 MG tablet Take 1 tablet (81 mg total) by mouth daily.  . Insulin Glargine (LANTUS SOLOSTAR) 100 UNIT/ML Solostar Pen INJECT 40 UNITS UNDER THE SKIN DAILY. INCREASE IN 2 UNIT INCREMENTS EVERY 3 DAYS UNTIL MORNING BLOOD SUGAR CONSISTENTLY BETWEEN 90-130  . Insulin Pen Needle (B-D UF III MINI PEN NEEDLES) 31G X 5 MM MISC Use to inject insulin daily.  . Insulin Syringe-Needle U-100 (B-D INS SYRINGE  0.5CC/31GX5/16) 31G X 5/16" 0.5 ML MISC 1 each by Does not apply route at bedtime.  Marland Kitchen JANUVIA 100 MG tablet TAKE 1 TABLET BY MOUTH EVERY DAY  . metFORMIN (GLUCOPHAGE) 1000 MG tablet TAKE 1 TABLET (1,000 MG TOTAL) BY MOUTH 2 (TWO) TIMES DAILY WITH A MEAL.  . pravastatin (PRAVACHOL) 40 MG tablet Take 1 tablet (40 mg total) by mouth daily.  . sildenafil (VIAGRA) 50 MG tablet Take 50 mg by mouth daily as needed for erectile dysfunction.     Allergies:   Codeine   Social History   Tobacco Use  . Smoking status: Never Smoker  . Smokeless tobacco: Never Used  Substance Use Topics  . Alcohol use: Yes    Comment: Social  . Drug use: No     Family Hx: The patient's family history includes Diabetes in his mother; Heart attack in his maternal grandfather and paternal grandfather; Hypertension in his father; Sleep apnea in an other family member. There is no history of Cancer, Colon cancer, Esophageal cancer, Stomach cancer, or Rectal cancer.  ROS:   Please see the history of present illness.     All other systems reviewed and are negative.   Labs/Other Tests and Data Reviewed:    Recent Labs: 12/29/2018: ALT 17 07/04/2019: Hemoglobin 15.0; Platelets 210 07/22/2019: BUN 17; Creatinine, Ser 0.85; Potassium 4.3; Sodium 146   Recent Lipid Panel Lab Results  Component Value Date/Time   CHOL 145 12/29/2018 01:53 PM   TRIG 77 12/29/2018 01:53 PM   HDL 39 (L) 12/29/2018 01:53 PM   CHOLHDL 3.7 12/29/2018 01:53 PM   CHOLHDL 4.9 08/12/2016 05:04 PM   LDLCALC 91 12/29/2018 01:53 PM    Wt Readings from Last 3 Encounters:  08/24/19 208 lb (94.3 kg)  07/08/19 208 lb (94.3 kg)  07/04/19 208 lb (94.3 kg)     Objective:    Vital Signs:  Pulse 71   Ht 5\' 10"  (1.778 m)   Wt 208 lb (94.3 kg)   BMI 29.84 kg/m     ASSESSMENT & PLAN:    1.  Chest pain -coronary CTA showed possible flow limiting noncalcified plaque in the LAD and D1 -results of coronary CTA reviewed with patient  -recommend  proceeding with cardiac cath to define coronary anatomy given his marked event of chest pressure with near syncope and findings on FFR -continue statin and ASA -Cardiac catheterization was discussed with the patient fully. The patient understands that risks include but are not limited to stroke (1 in 1000), death (1 in 1), kidney failure [usually temporary] (1 in 500), bleeding (1 in 200), allergic reaction [possibly serious] (1 in 200).  The patient understands and is willing to proceed.    2.  HLD -  LDL goal < 70  -LDL was 91 on review of outside labs from PCP  -increase pravastatin 80mg  daily - he has not missed any doses -repeat FLP and ALT in 6 weeks  3.  DM2 -followed by PCP -continue Insulin, metformin 1000mg  BID and Januvia 100mg  daily  COVID-19 Education: The signs and symptoms of COVID-19 were discussed with the patient and how to seek care for testing (follow up with PCP or arrange E-visit).  The importance of social distancing was discussed today.  Patient Risk:   After full review of this patient's clinical status, I feel that they are at least moderate risk at this time.  Time:   Today, I have spent 20 minutes on telemedicine discussing medical problems including chest pain, CAD, HLD, DM2 and reviewing patient's chart including coronary CTA, 2D echo, outside labs from PCP.  Medication Adjustments/Labs and Tests Ordered: Current medicines are reviewed at length with the patient today.  Concerns regarding medicines are outlined above.  Tests Ordered: No orders of the defined types were placed in this encounter.  Medication Changes: No orders of the defined types were placed in this encounter.   Disposition:  Follow up after cath  Signed, Fransico Him, MD  08/24/2019 8:50 AM    [place]

## 2019-08-24 ENCOUNTER — Encounter: Payer: Self-pay | Admitting: Cardiology

## 2019-08-24 ENCOUNTER — Telehealth: Admitting: Cardiology

## 2019-08-24 ENCOUNTER — Other Ambulatory Visit: Payer: Self-pay | Admitting: *Deleted

## 2019-08-24 ENCOUNTER — Other Ambulatory Visit: Payer: Self-pay

## 2019-08-24 ENCOUNTER — Other Ambulatory Visit (HOSPITAL_COMMUNITY)
Admission: RE | Admit: 2019-08-24 | Discharge: 2019-08-24 | Disposition: A | Discharge status: Home / Self Care | Source: Ambulatory Visit | Attending: Cardiovascular Disease | Admitting: Cardiovascular Disease

## 2019-08-24 ENCOUNTER — Telehealth (INDEPENDENT_AMBULATORY_CARE_PROVIDER_SITE_OTHER): Admitting: Cardiology

## 2019-08-24 ENCOUNTER — Other Ambulatory Visit: Admitting: *Deleted

## 2019-08-24 VITALS — HR 71 | Ht 70.0 in | Wt 208.0 lb

## 2019-08-24 DIAGNOSIS — I2510 Atherosclerotic heart disease of native coronary artery without angina pectoris: Secondary | ICD-10-CM | POA: Diagnosis not present

## 2019-08-24 DIAGNOSIS — Z712 Person consulting for explanation of examination or test findings: Secondary | ICD-10-CM

## 2019-08-24 DIAGNOSIS — E119 Type 2 diabetes mellitus without complications: Secondary | ICD-10-CM

## 2019-08-24 DIAGNOSIS — R079 Chest pain, unspecified: Secondary | ICD-10-CM

## 2019-08-24 DIAGNOSIS — Z20822 Contact with and (suspected) exposure to covid-19: Secondary | ICD-10-CM | POA: Diagnosis not present

## 2019-08-24 DIAGNOSIS — I2583 Coronary atherosclerosis due to lipid rich plaque: Secondary | ICD-10-CM | POA: Diagnosis not present

## 2019-08-24 DIAGNOSIS — Z01812 Encounter for preprocedural laboratory examination: Secondary | ICD-10-CM | POA: Diagnosis not present

## 2019-08-24 DIAGNOSIS — E1169 Type 2 diabetes mellitus with other specified complication: Secondary | ICD-10-CM

## 2019-08-24 DIAGNOSIS — E785 Hyperlipidemia, unspecified: Secondary | ICD-10-CM

## 2019-08-24 LAB — SARS CORONAVIRUS 2 (TAT 6-24 HRS): SARS Coronavirus 2: NEGATIVE

## 2019-08-24 MED ORDER — PRAVASTATIN SODIUM 80 MG PO TABS: 80.0000 mg | ORAL_TABLET | Freq: Every evening | ORAL | 3 refills | Status: DC

## 2019-08-24 NOTE — Patient Instructions (Addendum)
Medication Instructions:  Your physician has recommended you make the following change in your medication: increase pravastatin to 80 mg by mouth daily.    *If you need a refill on your cardiac medications before your next appointment, please call your pharmacy*  Lab Work: Your physician recommends that you have lab work done today at [facility]. --CBC and BMP Your physician recommends that you return for lab work on April 8,2021 at 7:45.  This will be fasting   If you have labs (blood work) drawn today and your tests are completely normal, you will receive your results only by: Marland Kitchen MyChart Message (if you have MyChart) OR . A paper copy in the mail If you have any lab test that is abnormal or we need to change your treatment, we will call you to review the results.  Testing/Procedures: Your physician has requested that you have a cardiac catheterization. Cardiac catheterization is used to diagnose and/or treat various heart conditions. Doctors may recommend this procedure for a number of different reasons. The most common reason is to evaluate chest pain. Chest pain can be a symptom of coronary artery disease (CAD), and cardiac catheterization can show whether plaque is narrowing or blocking your heart's arteries. This procedure is also used to evaluate the valves, as well as measure the blood flow and oxygen levels in different parts of your heart. For further information please visit [email or web address]. See instructions below.  Scheduled for February 26,2021    Follow-Up:  To be arranged after procedure.     [name], [place] [address] Dept: [phone] Loc: [phone]  Paul [name]  08/24/2019  You are scheduled for a Cardiac Catheterization on Friday, February 26 with Dr. Kathlyn Sacramento.  1. Please arrive at the [facility] (Main Entrance A)  at [facility]: [address] [place], [place] [address] at 7:00 AM (This time is two hours before your procedure to ensure your preparation). Free valet parking service is available.   Special note: Every effort is made to have your procedure done on time. Please understand that emergencies sometimes delay scheduled procedures.  2. Diet: Do not eat solid foods after midnight.  The patient may have clear liquids until 5am upon the day of the procedure.  3. Labs: You will need to have blood drawn on Wednesday, February 24 at [facility]. [address] [place]. Scheduled for 1:15     Phone: [phone]. You do not need to be fasting.  4. Medication instructions in preparation for your procedure:   Contrast Allergy: No   Take half your normal insulin dose evening before the procedure.  Do not take insulin or diabetes medicine the morning of the procedure.  Do not take metformin for 48 hours after procedure.  Do not take sildenafil for 72 hours prior to procedure.    On the morning of your procedure, take your Aspirin and any morning medicines NOT listed above.  You may use sips of water.  5. Plan for one night stay--bring personal belongings. 6. Bring a current list of your medications and current insurance cards. 7. You MUST have a responsible person to drive you home. 8. Someone MUST be with you the first 24 hours after you arrive home or your discharge will be delayed. 9. Please wear clothes that are easy to get on and off and wear slip-on  shoes.  One person may accompany you to the procedure.  This person will need to wait in the waiting room.   Your Pre-procedure COVID-19 Testing will be done on February 24,2021 at [facility] at [id number], [place], [place] [address]. Once you arrive at the testing site, stay in the right hand lane, go under the building overhang not the tent. If you are tested under the tent  your results may not be back before your procedure. Please be on time for your appointment.   This is scheduled for 1:40  After your swab you will be given a mask to wear and instructed to go home and quarantine/no visitors until after your procedure. If you test positive you will be notified and your procedure will be cancelled.    Thank you for allowing [place] to care for you!   -- [facility]

## 2019-08-25 ENCOUNTER — Telehealth: Payer: Self-pay | Admitting: *Deleted

## 2019-08-25 LAB — BASIC METABOLIC PANEL
BUN/Creatinine Ratio: 19 (ref 9–20)
BUN: 15 mg/dL (ref 6–24)
CO2: 23 mmol/L (ref 20–29)
Calcium: 9.2 mg/dL (ref 8.7–10.2)
Chloride: 103 mmol/L (ref 96–106)
Creatinine, Ser: 0.8 mg/dL (ref 0.76–1.27)
GFR calc Af Amer: 120 mL/min/{1.73_m2} (ref >59)
GFR calc non Af Amer: 104 mL/min/{1.73_m2} (ref >59)
Glucose: 256 mg/dL — ABNORMAL HIGH (ref 65–99)
Potassium: 4.2 mmol/L (ref 3.5–5.2)
Sodium: 139 mmol/L (ref 134–144)

## 2019-08-25 LAB — CBC
Hematocrit: 43.2 % (ref 37.5–51.0)
Hemoglobin: 15.1 g/dL (ref 13.0–17.7)
MCH: 30.6 pg (ref 26.6–33.0)
MCHC: 35 g/dL (ref 31.5–35.7)
MCV: 88 fL (ref 79–97)
Platelets: 209 10*3/uL (ref 150–450)
RBC: 4.93 x10E6/uL (ref 4.14–5.80)
RDW: 12.3 % (ref 11.6–15.4)
WBC: 7.9 10*3/uL (ref 3.4–10.8)

## 2019-08-25 NOTE — Telephone Encounter (Signed)
Pt contacted pre-catheterization scheduled at [facility] for: Friday August 26, 2019 9  AM Verified arrival time and place: [facility]) at: 7 AM   No solid food after midnight prior to cath, clear liquids until 5 AM day of procedure. Contrast allergy: no  Hold: Farxiga-AM of procedure. Amaryl-AM of procedure Januvia-PM prior to procedure. Metformin-day of procedure and 48 hours post procedure. Insulin-AM of procedure, pt states does not use Insulin HS Sildenafil-until post procedure.  Except hold medications M meds can be  taken pre-cath with sip of water including: ASA 81 mg   Confirmed patient has responsible adult to drive home post procedure and observe 24 hours after arriving home: yes  Currently, due to Covid-19 pandemic, only one person will be allowed with patient. Must be the same person for patient's entire stay and will be required to wear a mask. They will be asked to wait in the waiting room for the duration of the patient's stay.  Patients are required to wear a mask when they enter the hospital.      COVID-19 Pre-Screening Questions:  . In the past 7 to 10 days have you had a cough,  shortness of breath, headache, congestion, fever (100 or greater) body aches, chills, sore throat, or sudden loss of taste or sense of smell? no . Have you been around anyone with known Covid 19 in the past 7-10 days? 08/17/19 pt's supervisor COVID +, pt not in direct contact all the time. . Have you been around anyone who is awaiting Covid 19 test results in the past 7 to 10 days? See above . Have you been around anyone who has been exposed to Covid 19, or has mentioned symptoms of Covid 19 within the past 7 to 10 days? See above   I reviewed procedure/mask/visitor instructions, COVID-19 screening questions with patient, he verbalized understanding, thanked me for call.

## 2019-08-26 ENCOUNTER — Encounter (HOSPITAL_COMMUNITY)
Admission: RE | Disposition: A | Discharge status: Home / Self Care | Payer: Self-pay | Source: Home / Self Care | Attending: Cardiovascular Disease

## 2019-08-26 ENCOUNTER — Ambulatory Visit (HOSPITAL_COMMUNITY)
Admission: RE | Admit: 2019-08-26 | Discharge: 2019-08-26 | Disposition: A | Discharge status: Home / Self Care | Attending: Cardiovascular Disease | Admitting: Cardiovascular Disease

## 2019-08-26 ENCOUNTER — Other Ambulatory Visit: Payer: Self-pay

## 2019-08-26 DIAGNOSIS — I25119 Atherosclerotic heart disease of native coronary artery with unspecified angina pectoris: Secondary | ICD-10-CM | POA: Diagnosis not present

## 2019-08-26 DIAGNOSIS — Z79899 Other long term (current) drug therapy: Secondary | ICD-10-CM | POA: Diagnosis not present

## 2019-08-26 DIAGNOSIS — E119 Type 2 diabetes mellitus without complications: Secondary | ICD-10-CM | POA: Insufficient documentation

## 2019-08-26 DIAGNOSIS — Z7982 Long term (current) use of aspirin: Secondary | ICD-10-CM | POA: Diagnosis not present

## 2019-08-26 DIAGNOSIS — Z8249 Family history of ischemic heart disease and other diseases of the circulatory system: Secondary | ICD-10-CM | POA: Insufficient documentation

## 2019-08-26 DIAGNOSIS — E785 Hyperlipidemia, unspecified: Secondary | ICD-10-CM | POA: Insufficient documentation

## 2019-08-26 DIAGNOSIS — I209 Angina pectoris, unspecified: Secondary | ICD-10-CM

## 2019-08-26 DIAGNOSIS — Z885 Allergy status to narcotic agent status: Secondary | ICD-10-CM | POA: Diagnosis not present

## 2019-08-26 DIAGNOSIS — Z955 Presence of coronary angioplasty implant and graft: Secondary | ICD-10-CM

## 2019-08-26 DIAGNOSIS — G4733 Obstructive sleep apnea (adult) (pediatric): Secondary | ICD-10-CM | POA: Insufficient documentation

## 2019-08-26 DIAGNOSIS — Z794 Long term (current) use of insulin: Secondary | ICD-10-CM | POA: Diagnosis not present

## 2019-08-26 DIAGNOSIS — Z833 Family history of diabetes mellitus: Secondary | ICD-10-CM | POA: Insufficient documentation

## 2019-08-26 HISTORY — PX: CORONARY STENT INTERVENTION: CATH118234

## 2019-08-26 HISTORY — PX: LEFT HEART CATH AND CORONARY ANGIOGRAPHY: CATH118249

## 2019-08-26 LAB — POCT ACTIVATED CLOTTING TIME: Activated Clotting Time: 241 seconds

## 2019-08-26 LAB — GLUCOSE, CAPILLARY: Glucose-Capillary: 258 mg/dL — ABNORMAL HIGH (ref 70–99)

## 2019-08-26 SURGERY — LEFT HEART CATH AND CORONARY ANGIOGRAPHY
Anesthesia: LOCAL

## 2019-08-26 MED ORDER — NITROGLYCERIN 1 MG/10 ML FOR IR/CATH LAB
INTRA_ARTERIAL | Status: AC
  Filled 2019-08-26: qty 10

## 2019-08-26 MED ORDER — CLOPIDOGREL BISULFATE 75 MG PO TABS: 75.0000 mg | ORAL_TABLET | Freq: Every day | ORAL | 11 refills | Status: DC

## 2019-08-26 MED ORDER — HEPARIN SODIUM (PORCINE) 1000 UNIT/ML IJ SOLN
INTRAMUSCULAR | Status: AC
  Filled 2019-08-26: qty 1

## 2019-08-26 MED ORDER — IOHEXOL 350 MG/ML SOLN
INTRAVENOUS | Status: DC | PRN
  Administered 2019-08-26: 125 mL

## 2019-08-26 MED ORDER — NITROGLYCERIN 1 MG/10 ML FOR IR/CATH LAB
INTRA_ARTERIAL | Status: DC | PRN
  Administered 2019-08-26: 200 ug

## 2019-08-26 MED ORDER — SODIUM CHLORIDE 0.9% FLUSH: 3.0000 mL | Freq: Two times a day (BID) | INTRAVENOUS | Status: DC

## 2019-08-26 MED ORDER — VERAPAMIL HCL 2.5 MG/ML IV SOLN
INTRAVENOUS | Status: DC | PRN
  Administered 2019-08-26: 10 mL via INTRA_ARTERIAL

## 2019-08-26 MED ORDER — CLOPIDOGREL BISULFATE 75 MG PO TABS: 75.0000 mg | ORAL_TABLET | Freq: Every day | ORAL | Status: DC

## 2019-08-26 MED ORDER — SODIUM CHLORIDE 0.9 % IV SOLN: 250.0000 mL | INTRAVENOUS | Status: DC | PRN

## 2019-08-26 MED ORDER — ASPIRIN 81 MG PO CHEW: 81.0000 mg | CHEWABLE_TABLET | ORAL | Status: DC

## 2019-08-26 MED ORDER — SODIUM CHLORIDE 0.9 % WEIGHT BASED INFUSION
3.0000 mL/kg/h | INTRAVENOUS | Status: AC
  Administered 2019-08-26: 3 mL/kg/h via INTRAVENOUS

## 2019-08-26 MED ORDER — FENTANYL CITRATE (PF) 100 MCG/2ML IJ SOLN
INTRAMUSCULAR | Status: DC | PRN
  Administered 2019-08-26: 50 ug via INTRAVENOUS

## 2019-08-26 MED ORDER — MIDAZOLAM HCL 2 MG/2ML IJ SOLN
INTRAMUSCULAR | Status: DC | PRN
  Administered 2019-08-26: 1 mg via INTRAVENOUS

## 2019-08-26 MED ORDER — SODIUM CHLORIDE 0.9 % IV SOLN: INTRAVENOUS | Status: DC

## 2019-08-26 MED ORDER — LIDOCAINE HCL (PF) 1 % IJ SOLN
INTRAMUSCULAR | Status: AC
  Filled 2019-08-26: qty 30

## 2019-08-26 MED ORDER — HEPARIN SODIUM (PORCINE) 1000 UNIT/ML IJ SOLN
INTRAMUSCULAR | Status: DC | PRN
  Administered 2019-08-26: 5000 [IU] via INTRAVENOUS
  Administered 2019-08-26: 3000 [IU] via INTRAVENOUS
  Administered 2019-08-26: 5000 [IU] via INTRAVENOUS

## 2019-08-26 MED ORDER — CLOPIDOGREL BISULFATE 300 MG PO TABS
ORAL_TABLET | ORAL | Status: AC
  Filled 2019-08-26: qty 2

## 2019-08-26 MED ORDER — SODIUM CHLORIDE 0.9% FLUSH: 3.0000 mL | INTRAVENOUS | Status: DC | PRN

## 2019-08-26 MED ORDER — SODIUM CHLORIDE 0.9 % WEIGHT BASED INFUSION: 1.0000 mL/kg/h | INTRAVENOUS | Status: DC

## 2019-08-26 MED ORDER — LIDOCAINE HCL (PF) 1 % IJ SOLN
INTRAMUSCULAR | Status: DC | PRN
  Administered 2019-08-26: 2 mL

## 2019-08-26 MED ORDER — MIDAZOLAM HCL 2 MG/2ML IJ SOLN
INTRAMUSCULAR | Status: AC
  Filled 2019-08-26: qty 2

## 2019-08-26 MED ORDER — HEPARIN (PORCINE) IN NACL 1000-0.9 UT/500ML-% IV SOLN
INTRAVENOUS | Status: DC | PRN
  Administered 2019-08-26: 500 mL

## 2019-08-26 MED ORDER — VERAPAMIL HCL 2.5 MG/ML IV SOLN
INTRAVENOUS | Status: AC
  Filled 2019-08-26: qty 2

## 2019-08-26 MED ORDER — ACETAMINOPHEN 325 MG PO TABS: 650.0000 mg | ORAL_TABLET | ORAL | Status: DC | PRN

## 2019-08-26 MED ORDER — CLOPIDOGREL BISULFATE 300 MG PO TABS
ORAL_TABLET | ORAL | Status: DC | PRN
  Administered 2019-08-26: 600 mg via ORAL

## 2019-08-26 MED ORDER — ROSUVASTATIN CALCIUM 40 MG PO TABS: 40.0000 mg | ORAL_TABLET | Freq: Every day | ORAL | 11 refills | Status: DC

## 2019-08-26 MED ORDER — HEPARIN (PORCINE) IN NACL 1000-0.9 UT/500ML-% IV SOLN
INTRAVENOUS | Status: AC
  Filled 2019-08-26: qty 1000

## 2019-08-26 MED ORDER — ONDANSETRON HCL 4 MG/2ML IJ SOLN: 4.0000 mg | Freq: Four times a day (QID) | INTRAMUSCULAR | Status: DC | PRN

## 2019-08-26 MED ORDER — FENTANYL CITRATE (PF) 100 MCG/2ML IJ SOLN
INTRAMUSCULAR | Status: AC
  Filled 2019-08-26: qty 2

## 2019-08-26 MED ORDER — NITROGLYCERIN 0.4 MG SL SUBL: 0.4000 mg | SUBLINGUAL_TABLET | SUBLINGUAL | 2 refills | Status: DC | PRN

## 2019-08-26 MED FILL — NITROGLYCERIN 0.4 MG TAB SL: 0.4 | 8 days supply | Qty: 25 | Fill #0

## 2019-08-26 MED FILL — CLOPIDOGREL 75 MG TABLET: 75 | 30 days supply | Qty: 30 | Fill #0

## 2019-08-26 MED FILL — ROSUVASTATIN CALCIUM 40 MG: 40 | 30 days supply | Qty: 30 | Fill #0

## 2019-08-26 SURGICAL SUPPLY — 16 items
BALLN SAPPHIRE 2.0X15 (BALLOONS) ×2
BALLN SAPPHIRE ~~LOC~~ 2.75X18 (BALLOONS) ×1 IMPLANT
BALLOON SAPPHIRE 2.0X15 (BALLOONS) IMPLANT
CATH INFINITI 5FR JK (CATHETERS) ×1 IMPLANT
CATH LAUNCHER 6FR EBU3.5 (CATHETERS) ×1 IMPLANT
DEVICE RAD COMP TR BAND LRG (VASCULAR PRODUCTS) ×1 IMPLANT
GLIDESHEATH SLEND SS 6F .021 (SHEATH) ×1 IMPLANT
GUIDEWIRE INQWIRE 1.5J.035X260 (WIRE) IMPLANT
INQWIRE 1.5J .035X260CM (WIRE) ×2
KIT ENCORE 26 ADVANTAGE (KITS) ×1 IMPLANT
KIT HEART LEFT (KITS) ×2 IMPLANT
PACK CARDIAC CATHETERIZATION (CUSTOM PROCEDURE TRAY) ×2 IMPLANT
STENT RESOLUTE ONYX 2.5X22 (Permanent Stent) ×1 IMPLANT
TRANSDUCER W/STOPCOCK (MISCELLANEOUS) ×2 IMPLANT
TUBING CIL FLEX 10 FLL-RA (TUBING) ×2 IMPLANT
WIRE RUNTHROUGH .014X180CM (WIRE) ×1 IMPLANT

## 2019-08-26 NOTE — Discharge Summary (Signed)
Discharge Summary for Same Day PCI   [name] MRN: [id number]; DOB: 12-Oct-1970  Admit date: 08/26/2019 Discharge date: 08/26/2019  Primary Care Provider: Ladell Pier, MD  Primary Cardiologist: Fransico Him, MD  Primary Electrophysiologist:  None   Discharge Diagnoses    Active Problems:   Angina pectoris [facility])    Diagnostic Studies/Procedures    Cardiac Catheterization 08/26/2019:   The left ventricular systolic function is normal.  LV end diastolic pressure is normal.  The left ventricular ejection fraction is 55-65% by visual estimate.  1st Diag lesion is 60% stenosed.  Mid LAD-1 lesion is 40% stenosed.  Mid LAD-2 lesion is 80% stenosed.  Post intervention, there is a 0% residual stenosis.  A drug-eluting stent was successfully placed using a STENT RESOLUTE ONYX 2.5X22.   1.  Significant one-vessel coronary artery disease involving the mid to distal LAD.  The left circumflex is very small.  However, the RCA is very large and super dominant.  There is also 60% stenosis in a large first diagonal and moderate mid LAD stenosis 2.  Normal LV systolic function and left ventricular end-diastolic pressure. 3.  Successful angioplasty and drug-eluting stent placement to the mid/distal LAD.  Recommendations: Dual antiplatelet therapy for at least 6 months. The patient needs aggressive treatment of his risk factors especially with the presence of residual coronary artery disease.  He needs to be in a potent statin and I elected to switch him to rosuvastatin from pravastatin. The patient is a candidate for same-day discharge.  Diagnostic Dominance: Right  Intervention    _____________   History of Present Illness     Paul [name] is a 49 y.o. male with a hx of DM, HLD and OSA on CPAP. He was recently seen in the ER with complaint of chest pain that started the night before while at rest. He awakened about 2am with a strange  sensation in middle of his chest that was a pressure sensation that was very uncomfortable. There was no radiation of the sensation and no associated SOB, or nausea but he did have diaphoresis. He decided to get up and check his BS and when he got up he suddenly felt flushed and lightheaded. According to his wife he started to fall into the stove and his wife grabbed him and sat him down in the chair and he became pale. He never completely passed out. He checked his apple watch when he woke up and his HR was in the 40's. He denied any palpitations. In ER EKG was normal with normal QTc and labs were normal. Trop was 4 x 2. He does not smoke and has no family hx of CAD but his Dad had afib and died a few weeks after an afib ablation. He reported regular exercise with no CP or SOB.   He underwent coronary CTA which showed a calcium score of 8 and moderate atherosclerosis of the mid to distal LAD and D1. FFR showed possible flow limiting lesions in the LAD and D1.  He presented back to the office to discuss the results and possible cardiac cath.     Outpatient cardiac catheterization was arranged for further evaluation.  Hospital Course     The patient underwent cardiac cath as noted above with significant single vessel disease involving the m/dLAD with PCI/DESx1 to the LAD. Plan for DAPT with ASA/plavix for at least 6 months. The patient was seen by cardiac rehab while in short stay. There were  no observed complications post cath. Radial cath site was re-evaluated prior to discharge and found to be stable without any complications.   Paul [name] was seen by Dr. Fletcher Anon and determined stable for discharge home. Follow up with our office has been arranged. Instructions/precautions regarding cath site care were given prior to discharge. Medications are listed below. Pertinent changes include addition of plavix and switched to Crestor 40mg  daily.  _____________  Did the patient have an acute  coronary syndrome (MI, NSTEMI, STEMI, etc) this admission?:  No                               Did the patient have a percutaneous coronary intervention (stent / angioplasty)?:  Yes.     Cath/PCI Registry Performance & Quality Measures: 1. Aspirin prescribed? - Yes 2. ADP Receptor Inhibitor (Plavix/Clopidogrel, Brilinta/Ticagrelor or Effient/Prasugrel) prescribed (includes medically managed patients)? - Yes 3. High Intensity Statin (Lipitor 40-80mg  or Crestor 20-40mg ) prescribed? - Yes 4. For EF <40%, was ACEI/ARB prescribed? - Not Applicable (EF >/= [id number]) 5. For EF <40%, Aldosterone Antagonist (Spironolactone or Eplerenone) prescribed? - Not Applicable (EF >/= [id number]) 6. Cardiac Rehab Phase II ordered (Included Medically managed Patients)? - Yes   _____________   Discharge Vitals Blood pressure (!) 130/92, pulse 74, temperature 97.7 F (36.5 C), temperature source Oral, resp. rate 16, height 5\' 10"  (1.778 m), weight 94.3 kg, SpO2 98 %.  Filed Weights   08/26/19 0718  Weight: 94.3 kg    Last Labs & Radiologic Studies    CBC Recent Labs    08/24/19 1324  WBC 7.9  HGB 15.1  HCT 43.2  MCV 88  PLT [id number]   Basic Metabolic Panel Recent Labs    08/24/19 1324  NA 139  K 4.2  CL 103  CO2 23  GLUCOSE 256*  BUN 15  CREATININE 0.80  CALCIUM 9.2   Liver Function Tests No results for input(s): AST, ALT, ALKPHOS, BILITOT, PROT, ALBUMIN in the last 72 hours. No results for input(s): LIPASE, AMYLASE in the last 72 hours. High Sensitivity Troponin:   No results for input(s): TROPONINIHS in the last 720 hours.  BNP Invalid input(s): POCBNP D-Dimer No results for input(s): DDIMER in the last 72 hours. Hemoglobin A1C No results for input(s): HGBA1C in the last 72 hours. Fasting Lipid Panel No results for input(s): CHOL, HDL, LDLCALC, TRIG, CHOLHDL, LDLDIRECT in the last 72 hours. Thyroid Function Tests No results for input(s): TSH, T4TOTAL, T3FREE, THYROIDAB in the last 72  hours.  Invalid input(s): FREET3 _____________  CARDIAC CATHETERIZATION  Result Date: 08/26/2019  The left ventricular systolic function is normal.  LV end diastolic pressure is normal.  The left ventricular ejection fraction is 55-65% by visual estimate.  1st Diag lesion is 60% stenosed.  Mid LAD-1 lesion is 40% stenosed.  Mid LAD-2 lesion is 80% stenosed.  Post intervention, there is a 0% residual stenosis.  A drug-eluting stent was successfully placed using a STENT RESOLUTE ONYX 2.5X22.  1.  Significant one-vessel coronary artery disease involving the mid to distal LAD.  The left circumflex is very small.  However, the RCA is very large and super dominant.  There is also 60% stenosis in a large first diagonal and moderate mid LAD stenosis 2.  Normal LV systolic function and left ventricular end-diastolic pressure. 3.  Successful angioplasty and drug-eluting stent placement to the mid/distal LAD. Recommendations: Dual antiplatelet therapy for at least  6 months. The patient needs aggressive treatment of his risk factors especially with the presence of residual coronary artery disease.  He needs to be in a potent statin and I elected to switch him to rosuvastatin from pravastatin. The patient is a candidate for same-day discharge.   CT CORONARY MORPH W/CTA COR W/SCORE W/CA W/CM &/OR WO/CM  Addendum Date: 08/19/2019   ADDENDUM REPORT: 08/19/2019 20:13 EXAM: Cardiac/Coronary  CT TECHNIQUE: The patient was scanned on a [employer]. FINDINGS: A 120 kV prospective scan was triggered in the descending thoracic aorta at 111 HU's. Axial non-contrast 3 mm slices were carried out through the heart. The data set was analyzed on a dedicated work station and scored using the [place]. Gantry rotation speed was 250 msecs and collimation was .6 mm. No beta blockade and 0.8 mg of sl NTG was given. The 3D data set was reconstructed in 5% intervals of the 67-82 % of the R-R cycle. Diastolic phases  were analyzed on a dedicated work station using MPR, MIP and VRT modes. The patient received 80 cc of contrast. Aorta:  Normal size.  No calcifications.  No dissection. Aortic Valve:  Trileaflet.  No calcifications. Coronary Arteries:  Normal coronary origin.  Right dominance. RCA is a large dominant artery that gives rise to PDA and PLVB. There is no plaque. Left main is a large artery that gives rise to LAD and LCX arteries. There is no plaque. LAD is a large vessel that gives rise to a large D1. There is moderate non calcified plaque in the proximal to mid D1 with associated stenosis of 50-69%. There is mild calcified plaque in the mid LAD with associated stenosis of 25-49%. There is moderate non calcified plaque in the mid to distal LAD with associated stenosis of 50-69%. LCX is a non-dominant artery small artery that is poorly visualized but no obvious plaque present. Other findings: Normal pulmonary vein drainage into the left atrium. Normal let atrial appendage without a thrombus. Normal size of the pulmonary artery. IMPRESSION: 1. Coronary calcium score of 8. This was 67th percentile for age and sex matched control. 2.  Normal coronary origin with right dominance. 3. Moderate atherosclerosis of the mid to distal LAD and D1. CAD-RADS 3. 4. Consider symptom-guided anti-ischemic and preventive pharmacotherapy as well as risk factor modification per guideline-directed care. 5.  The study has been submitted for FFR analysis. Fransico Him Electronically Signed   By: Fransico Him   On: 08/19/2019 20:13   Result Date: 08/19/2019 EXAM: OVER-READ INTERPRETATION  CT CHEST The following report is an over-read performed by radiologist Dr. Vinnie Langton of [facility], [place] on 08/19/2019. This over-read does not include interpretation of cardiac or coronary anatomy or pathology. The coronary calcium score/coronary CTA interpretation by the cardiologist is attached. COMPARISON:  None. FINDINGS: Within the  visualized portions of the thorax there are no suspicious appearing pulmonary nodules or masses, there is no acute consolidative airspace disease, no pleural effusions, no pneumothorax and no lymphadenopathy. Visualized portions of the upper abdomen are unremarkable. There are no aggressive appearing lytic or blastic lesions noted in the visualized portions of the skeleton. IMPRESSION: No significant incidental noncardiac findings are noted. Electronically Signed: By: Vinnie Langton M.D. On: 08/19/2019 16:36   CT CORONARY FRACTIONAL FLOW RESERVE DATA PREP  Result Date: 08/21/2019 EXAM: FFRCT ANALYSIS FINDINGS: FFRct analysis was performed on the original cardiac CT angiogram dataset. Diagrammatic representation of the FFRct analysis is provided in a separate PDF document  in PACS. This dictation was created using the PDF document and an interactive 3D model of the results. 3D model is not available in the EMR/PACS. Normal FFR range is >0.80. 1. Left Main: No significant flow limiting lesion. LM FFR = 0.98. 2. LAD: Possible flow limiting lesion. Proximal FFR = 0.91, Mid FFR = 0.91, Distal FFR = 0.79. Flow limiting lesion noted in D1. Proximal FFR D1 = 0.90, Mid FFR D1 = 0.74, Distal FFR D1 = 0.72. 3. LCX: No signficant stenosis. Proximal FFR = 0.92. The mid and distal LCx were not adequate for FFR analysis. 4. OM1: No significant stenosis. Proximal FFR = 0.92, Mid to Distal FFR = 0.89. 5. RCA: No significant stenosis. Proximal FFR = 0.98, Mid FFR = 0.95, Distal FFR = 0.90. IMPRESSION: 1. CT FFR analysis demonstrates possible hemodynamically flow limiting lesion in the mid LAD and Mid diagonal. 2. Consider initial medical management vs. Cardiac catheterization. Fransico Him Electronically Signed   By: Fransico Him   On: 08/21/2019 21:18    Disposition   Pt is being discharged home today in good condition.  Follow-up Plans & Appointments    Follow-up Information    Evans Lance, MD Follow up on  09/02/2019.   Specialty: Cardiology Why: at [facility] for your follow up appt Contact information: [address] [place] [address] [phone]          Discharge Instructions    Amb Referral to Cardiac Rehabilitation   Complete by: As directed    Diagnosis:  Coronary Stents PTCA     After initial evaluation and assessments completed: Virtual Based Care may be provided alone or in conjunction with Phase 2 Cardiac Rehab based on patient barriers.: Yes       Discharge Medications   Allergies as of 08/26/2019      Reactions   Codeine Nausea And Vomiting, Other (See Comments)   headache      Medication List    STOP taking these medications   pravastatin 40 MG tablet Commonly known as: PRAVACHOL   pravastatin 80 MG tablet Commonly known as: PRAVACHOL   Saw Palmetto 450 MG Caps     TAKE these medications   aspirin EC 81 MG tablet Take 1 tablet (81 mg total) by mouth daily.   B-D UF III MINI PEN NEEDLES 31G X 5 MM Misc Generic drug: Insulin Pen Needle Use to inject insulin daily.   clopidogrel 75 MG tablet Commonly known as: Plavix Take 1 tablet (75 mg total) by mouth daily.   Farxiga 5 MG Tabs tablet Generic drug: dapagliflozin propanediol Take 5 mg by mouth daily.   glimepiride 4 MG tablet Commonly known as: AMARYL Take 8 mg by mouth daily with breakfast.   Insulin Syringe-Needle U-100 31G X 5/16" 0.5 ML Misc Commonly known as: B-D INS SYRINGE 0.5CC/31GX5/16 1 each by Does not apply route at bedtime.   Januvia 100 MG tablet Generic drug: sitaGLIPtin TAKE 1 TABLET BY MOUTH EVERY DAY What changed:   how much to take  when to take this   Lantus SoloStar 100 UNIT/ML Solostar Pen Generic drug: Insulin Glargine INJECT 40 UNITS UNDER THE SKIN DAILY. INCREASE IN 2 UNIT INCREMENTS EVERY 3 DAYS UNTIL MORNING BLOOD SUGAR CONSISTENTLY BETWEEN 90-130 What changed:   how much to take  how to take this  when to take this  additional  instructions   metFORMIN 1000 MG tablet Commonly known as: GLUCOPHAGE TAKE 1 TABLET (1,000 MG TOTAL)  BY MOUTH 2 (TWO) TIMES DAILY WITH A MEAL.   nitroGLYCERIN 0.4 MG SL tablet Commonly known as: Nitrostat Place 1 tablet (0.4 mg total) under the tongue every 5 (five) minutes as needed.   rosuvastatin 40 MG tablet Commonly known as: Crestor Take 1 tablet (40 mg total) by mouth at bedtime.   sildenafil 50 MG tablet Commonly known as: VIAGRA Take 50 mg by mouth daily as needed for erectile dysfunction.   THERAWORX RELIEF EX Apply 1 application topically daily as needed (muscle cramps / pains).   Turmeric 500 MG Caps Take 1,000 mg by mouth every evening.       Allergies Allergies  Allergen Reactions  . Codeine Nausea And Vomiting and Other (See Comments)    headache    Outstanding Labs/Studies   FLP/LFTs in 8 weeks.  Duration of Discharge Encounter   Greater than 30 minutes including physician time.  Signed, Reino Bellis, NP 08/26/2019, 2:25 PM

## 2019-08-26 NOTE — Interval H&P Note (Signed)
Cath Lab Visit (complete for each Cath Lab visit)  Clinical Evaluation Leading to the Procedure:   ACS: No.  Non-ACS:    Anginal Classification: CCS II  Anti-ischemic medical therapy: No Therapy  Non-Invasive Test Results: Intermediate-risk stress test findings: cardiac mortality 1-3%/year  Prior CABG: No previous CABG      History and Physical Interval Note:  08/26/2019 8:25 AM  Paul [name]  has presented today for surgery, with the diagnosis of Chest pain abnormal cardiac CT.  The various methods of treatment have been discussed with the patient and family. After consideration of risks, benefits and other options for treatment, the patient has consented to  Procedure(s): LEFT HEART CATH AND CORONARY ANGIOGRAPHY (N/A) as a surgical intervention.  The patient's history has been reviewed, patient examined, no change in status, stable for surgery.  I have reviewed the patient's chart and labs.  Questions were answered to the patient's satisfaction.     Kathlyn [place]

## 2019-08-26 NOTE — Progress Notes (Signed)
Discussed stent, Plavix, restrictions, diet, exercise, NTG, and CRPII with pt. Good reception. He watches his diet (low carbs) and exercises (walking and weights) but unfortunately his DM is not quite controlled. Encouraged him to see his PCP. Also encouraged him to see RD at [facility]. Will refer to [place]. He understands the importance of Plavix. [place], ACSM 12:34 PM 08/26/2019

## 2019-08-26 NOTE — Discharge Instructions (Signed)
Drink plenty of fluids next 48 hours and keep right wrist elevated at heart level for 24 hours Hold Metformin for 48 hours  Radial Site Care  This sheet gives you information about how to care for yourself after your procedure. Your health care provider may also give you more specific instructions. If you have problems or questions, contact your health care provider. What can I expect after the procedure? After the procedure, it is common to have:  Bruising and tenderness at the catheter insertion area. Follow these instructions at home: Medicines  Take over-the-counter and prescription medicines only as told by your health care provider. Insertion site care  Follow instructions from your health care provider about how to take care of your insertion site. Make sure you: ? Wash your hands with soap and water before you change your bandage (dressing). If soap and water are not available, use hand sanitizer. ? Remove your dressing as told by your health care provider. In 24-48 hours  Check your insertion site every day for signs of infection. Check for: ? Redness, swelling, or pain. ? Fluid or blood. ? Pus or a bad smell. ? Warmth.  Do not take baths, swim, or use a hot tub until your health care provider approves.  You may shower 24-48 hours after the procedure, or as directed by your health care provider. ? Remove the dressing and gently wash the site with plain soap and water. ? Pat the area dry with a clean towel. ? Do not rub the site. That could cause bleeding.  Do not apply powder or lotion to the site. Activity   For 24 hours after the procedure, or as directed by your health care provider: ? Do not flex or bend the affected arm. ? Do not push or pull heavy objects with the affected arm. ? Do not drive yourself home from the hospital or clinic. You may drive 24 hours after the procedure unless your health care provider tells you not to. ? Do not operate machinery or  power tools.  Do not lift anything that is heavier than 5 lb (4.5 kg), or the limit that you are told, until your health care provider says that it is safe. For 4 days  Ask your health care provider when it is okay to: ? Return to work or school. ? Resume usual physical activities or sports. ? Resume sexual activity. General instructions  If the catheter site starts to bleed, raise your arm and put firm pressure on the site. If the bleeding does not stop, get help right away. This is a medical emergency.  If you went home on the same day as your procedure, a responsible adult should be with you for the first 24 hours after you arrive home.  Keep all follow-up visits as told by your health care provider. This is important. Contact a health care provider if:  You have a fever.  You have redness, swelling, or yellow drainage around your insertion site. Get help right away if:  You have unusual pain at the radial site.  The catheter insertion area swells very fast.  The insertion area is bleeding, and the bleeding does not stop when you hold steady pressure on the area.  Your arm or hand becomes pale, cool, tingly, or numb. These symptoms may represent a serious problem that is an emergency. Do not wait to see if the symptoms will go away. Get medical help right away. Call your local emergency services (911 in  the [place] Do not drive yourself to the hospital. Summary  After the procedure, it is common to have bruising and tenderness at the site.  Follow instructions from your health care provider about how to take care of your radial site wound. Check the wound every day for signs of infection.  Do not lift anything that is heavier than 10 lb (4.5 kg), or the limit that you are told, until your health care provider says that it is safe. This information is not intended to replace advice given to you by your health care provider. Make sure you discuss any questions you have with your  health care provider. Document Revised: 07/22/2017 Document Reviewed: 07/22/2017 Elsevier Patient Education  2020 [employer].

## 2019-08-29 ENCOUNTER — Other Ambulatory Visit: Payer: Self-pay | Admitting: Cardiology

## 2019-08-29 DIAGNOSIS — R55 Syncope and collapse: Secondary | ICD-10-CM

## 2019-08-29 DIAGNOSIS — R072 Precordial pain: Secondary | ICD-10-CM

## 2019-08-30 ENCOUNTER — Telehealth: Payer: Self-pay

## 2019-08-30 ENCOUNTER — Encounter (HOSPITAL_COMMUNITY): Payer: Self-pay | Admitting: *Deleted

## 2019-08-30 NOTE — Progress Notes (Signed)
Referral received for Cardiac rehab and verified for MD signature.  Follow up appt to be determined.  Pt has upcoming consult for VTach with Dr. Lovena Le on 09/02/19. Insurance benefits and eligibility to be determined.pt will be contacted and scheduled upon the satisfactory completion of the follow up appt and pt is medically stable per EP consult. Cherre Huger, BSN Cardiac and Training and development officer

## 2019-08-30 NOTE — Telephone Encounter (Signed)
My Chart

## 2019-09-02 ENCOUNTER — Other Ambulatory Visit

## 2019-09-02 ENCOUNTER — Telehealth: Payer: Self-pay

## 2019-09-02 ENCOUNTER — Institutional Professional Consult (permissible substitution): Admitting: Internal Medicine

## 2019-09-02 MED ORDER — METOPROLOL SUCCINATE ER 25 MG PO TB24: 25.0000 mg | ORAL_TABLET | Freq: Every day | ORAL | 3 refills | Status: DC

## 2019-09-02 NOTE — Telephone Encounter (Signed)
Patient requesting to speak with Paul [name].

## 2019-09-02 NOTE — Telephone Encounter (Signed)
Patient's wife has been notified of results and verbalized understanding. Rx has been sent for Toprol 25 mg daily. Patient aware that evaluation with EP was cancelled. He will follow up with APP next week to check cath site and follow up with Dr. Radford Pax in 4 weeks.

## 2019-09-02 NOTE — Telephone Encounter (Signed)
-----   Message from Sueanne Margarita, MD sent at 08/29/2019  7:39 PM EST ----- Please let patient know that event monitor just showed the 1 episode of NSVt and occasional extra heart beats from the bottom of his heart which is likely related to underlying CAD.  2D echo showed normal LVF.  Start Toprol XL 25mg  daily.  OK to cancel EP eval since he has CAD that likely is causing the PVCs.  WIll see how he does on BB therapy.  Followup with me in 4 weeks.  Can have him followup with extender in 1 week to check cath site

## 2019-09-02 NOTE — Telephone Encounter (Signed)
Pt c/o medication issue:  1. Name of Medication: metoprolol succinate (TOPROL XL) 25 MG 24 hr tablet  2. How are you currently taking this medication (dosage and times per day)? Once daily  3. Are you having a reaction (difficulty breathing--STAT)? no  4. What is your medication issue? Wife has concerns with this medication and patient being a diabetic.

## 2019-09-03 ENCOUNTER — Other Ambulatory Visit: Payer: Self-pay | Admitting: Internal Medicine

## 2019-09-05 NOTE — Telephone Encounter (Signed)
Left message to call back  

## 2019-09-07 ENCOUNTER — Ambulatory Visit: Attending: Internal Medicine | Admitting: Pharmacist

## 2019-09-07 ENCOUNTER — Other Ambulatory Visit: Payer: Self-pay

## 2019-09-07 DIAGNOSIS — Z794 Long term (current) use of insulin: Secondary | ICD-10-CM

## 2019-09-07 DIAGNOSIS — E119 Type 2 diabetes mellitus without complications: Secondary | ICD-10-CM

## 2019-09-07 DIAGNOSIS — E1165 Type 2 diabetes mellitus with hyperglycemia: Secondary | ICD-10-CM | POA: Diagnosis not present

## 2019-09-07 MED ORDER — GLIMEPIRIDE 4 MG PO TABS: 4.0000 mg | ORAL_TABLET | Freq: Every day | ORAL | 0 refills | Status: DC

## 2019-09-07 MED ORDER — JARDIANCE 10 MG PO TABS: 10.0000 mg | ORAL_TABLET | Freq: Every day | ORAL | 2 refills | Status: DC

## 2019-09-07 MED ORDER — BD PEN NEEDLE MINI U/F 31G X 5 MM MISC: 0 refills | Status: DC

## 2019-09-07 NOTE — Progress Notes (Signed)
    S:    PCP: Dr. Wynetta Emery  No chief complaint on file.  Patient arrives in good spirits. Presents for diabetes management at the request of Dr. Wynetta Emery. Patient was referred on 07/08/19.  Family/Social History:  - FHx: HTN (father), DM (mother) - Tobacco: never smoker - Alcohol: social/occasional  Insurance coverage/medication affordability:  - Tricare  Patient denies adherence with medications.  Current diabetes medications include:  - Farxiga 5 mg daily (not taking) - glimepiride 8 mg daily - insulin glargine 48 units daily.  - metformin 100 mg BID - Januvia 100 mg   Patient reports hypoglycemic events. Several fasting levels in 50- 60s.   Patient reported dietary habits:  - Reports revamping diet since stent placement  - Eats oatmeal for breakfast, salads for lunch  Patient-reported exercise habits:  - Has been limited d/t to recent cardiac surgery - Plans to begin lifting/walking more   Patient denies nocturia.  Patient denies neuropathy. Patient denies visual changes. Patient reports self foot exams.   O:  POCT: 91 Home values:  Range: 50s - low 100s  Occasional outliers above 150.   Lab Results  Component Value Date   HGBA1C 8.9 (A) 03/25/2019   There were no vitals filed for this visit.  Lipid Panel     Component Value Date/Time   CHOL 145 12/29/2018 1353   TRIG 77 12/29/2018 1353   HDL 39 (L) 12/29/2018 1353   CHOLHDL 3.7 12/29/2018 1353   CHOLHDL 4.9 08/12/2016 1704   VLDL 23 08/12/2016 1704   LDLCALC 91 12/29/2018 1353   Clinical ASCVD: Yes-CAD S/p stent placement  A/P: Diabetes longstanding currently uncontrolled. Patient is able to verbalize appropriate hypoglycemia management plan. Pt is mostly adherent with medications but has not started his [place] for concern of bladder cancer.   I have reviewed this risk with him. Due to his recent CAD/stent placement, he would benefit from CV protection offered with Jardiance. He would like to try  [place] instead of [place]. I have counseled him to decrease glimepiride to 4 mg daily given his hypoglycemia.    -Stop Wilder Glade, start Jardiance 10 mg daily.  -Decrease glimepiride to 4 mg daily.  -Continue other medications  -Extensively discussed pathophysiology of DM, recommended lifestyle interventions, dietary effects on glycemic control -Counseled on s/sx of and management of hypoglycemia -No POCT A1c available; will check next month.   ASCVD risk - secondary prevention in patient with DM. Last LDL is not < 70. High intensity statin indicated.  - Continue rosuvastatin 40 mg daily  Written patient instructions provided.  Total time in face to face counseling 15 minutes.   Follow up with PCP next month.  Benard Halsted, PharmD, [place] [phone]

## 2019-09-08 ENCOUNTER — Encounter: Payer: Self-pay | Admitting: Family Medicine

## 2019-09-08 ENCOUNTER — Ambulatory Visit (INDEPENDENT_AMBULATORY_CARE_PROVIDER_SITE_OTHER): Admitting: Family Medicine

## 2019-09-08 VITALS — BP 120/74 | HR 72 | Ht 70.5 in | Wt 207.0 lb

## 2019-09-08 DIAGNOSIS — Z955 Presence of coronary angioplasty implant and graft: Secondary | ICD-10-CM | POA: Diagnosis not present

## 2019-09-08 DIAGNOSIS — I2583 Coronary atherosclerosis due to lipid rich plaque: Secondary | ICD-10-CM

## 2019-09-08 DIAGNOSIS — E1169 Type 2 diabetes mellitus with other specified complication: Secondary | ICD-10-CM

## 2019-09-08 DIAGNOSIS — I2510 Atherosclerotic heart disease of native coronary artery without angina pectoris: Secondary | ICD-10-CM | POA: Insufficient documentation

## 2019-09-08 DIAGNOSIS — E785 Hyperlipidemia, unspecified: Secondary | ICD-10-CM

## 2019-09-08 MED ORDER — CLOPIDOGREL BISULFATE 75 MG PO TABS: 75.0000 mg | ORAL_TABLET | Freq: Every day | ORAL | 3 refills | Status: DC

## 2019-09-08 MED ORDER — ROSUVASTATIN CALCIUM 40 MG PO TABS: 40.0000 mg | ORAL_TABLET | Freq: Every day | ORAL | 3 refills | Status: DC

## 2019-09-08 MED ORDER — METOPROLOL SUCCINATE ER 25 MG PO TB24: 25.0000 mg | ORAL_TABLET | Freq: Every day | ORAL | 3 refills | Status: DC

## 2019-09-08 NOTE — Patient Instructions (Addendum)
Medication Instructions:  Your physician recommends that you continue on your current medications as directed. Please refer to the Current Medication list given to you today.  *If you need a refill on your cardiac medications before your next appointment, please call your pharmacy*   Lab Work: None ordered  If you have labs (blood work) drawn today and your tests are completely normal, you will receive your results only by: Marland Kitchen MyChart Message (if you have MyChart) OR . A paper copy in the mail If you have any lab test that is abnormal or we need to change your treatment, we will call you to review the results.   Testing/Procedures: None ordered   Follow-Up: At [facility], you and your health needs are our priority.  As part of our continuing mission to provide you with exceptional heart care, we have created designated Provider Care Teams.  These Care Teams include your primary Cardiologist (physician) and Advanced Practice Providers (APPs -  Physician Assistants and Nurse Practitioners) who all work together to provide you with the care you need, when you need it.  We recommend signing up for the patient portal called "MyChart".  Sign up information is provided on this After Visit Summary.  MyChart is used to connect with patients for Virtual Visits (Telemedicine).  Patients are able to view lab/test results, encounter notes, upcoming appointments, etc.  Non-urgent messages can be sent to your provider as well.   To learn more about what you can do with MyChart, go to [email or web address].    Your next appointment:   3 month(s)  The format for your next appointment:   In Person  Provider:   You may see Fransico Him, MD or one of the following Advanced Practice Providers on your designated Care Team:    Melina Copa, PA-C  Ermalinda Barrios, PA-C    Other Instructions

## 2019-09-08 NOTE — Assessment & Plan Note (Signed)
Newly diagnosed CAD with DES to LAD

## 2019-09-08 NOTE — Progress Notes (Addendum)
Cardiology Office Note  Date: 09/08/2019   ID: [name], DOB Nov 06, 1970, MRN [id number]  PCP:  Ladell Pier, MD  Cardiologist:  Fransico Him, MD Electrophysiologist:  None   Chief Complaint: CAD s/p Cardiac cath with PCI, DM, HLD, OSA  History of Present Illness: Paul [name] is a 49 y.o. male with a history of DM, HLD, OSA on CPAP.  Recently seen in ER 07/04/2019 with complaint of chest pain that started night before at rest.  Underwent coronary CT calcium score 80 moderate atherosclerosis of the mid to distal LAD and D1, FFR showed possible flow-limiting lesions in the LAD and D1.  Cardiac catheterization: 08/26/2019. Significant single vessel disease involving the m/dLAD with PCI/DESx1 to the LAD. Plan for DAPT with ASA/plavix for at least 6 months. The patient was seen by cardiac rehab while in short stay. There were no observed complications post cath. Radial cath site was re-evaluated prior to discharge and found to be stable without any complications.     He denies any subsequent complications from cardiac catheterization.  His right radial access site is clean and dry with good pulses and no signs of infection.  He denies any progressive anginal or exertional symptoms other than a mild twinge every now and then which he attributes to possible psychological issues related to the catheterization and recent event.  He denies any orthostatic symptoms, stroke or TIA-like symptoms, dyspeptic symptoms, blood in stool or urine.  Claudication-like symptoms, DVT or PE-like symptoms, or lower extremity edema. He had several questions at today's visit and all were answered to his satisfaction.   Past Medical History:  Diagnosis Date  . Diabetes mellitus without complication ([place])   . Hyperlipidemia   . Kidney stones   . Sleep apnea    wears CPAP    Past Surgical History:  Procedure Laterality Date  . COLONOSCOPY    . CORONARY STENT INTERVENTION N/A 08/26/2019   Procedure: CORONARY STENT INTERVENTION;  Surgeon: Wellington Hampshire, MD;  Location: [facility];  Service: Cardiovascular;  Laterality: N/A;  . KNEE ARTHROSCOPY Right   . LEFT HEART CATH AND CORONARY ANGIOGRAPHY N/A 08/26/2019   Procedure: LEFT HEART CATH AND CORONARY ANGIOGRAPHY;  Surgeon: Wellington Hampshire, MD;  Location: [facility];  Service: Cardiovascular;  Laterality: N/A;  . UMBILICAL HERNIA REPAIR      Current Outpatient Medications  Medication Sig Dispense Refill  . aspirin EC 81 MG tablet Take 1 tablet (81 mg total) by mouth daily. 90 tablet 3  . clopidogrel (PLAVIX) 75 MG tablet Take 1 tablet (75 mg total) by mouth daily. 30 tablet 11  . glimepiride (AMARYL) 4 MG tablet Take 1 tablet (4 mg total) by mouth daily with breakfast. 90 tablet 0  . Homeopathic Products [facility] RELIEF EX) Apply 1 application topically daily as needed (muscle cramps / pains).    . Insulin Glargine (LANTUS SOLOSTAR) 100 UNIT/ML Solostar Pen INJECT 40 UNITS UNDER THE SKIN DAILY. INCREASE IN 2 UNIT INCREMENTS EVERY 3 DAYS UNTIL MORNING BLOOD SUGAR CONSISTENTLY BETWEEN 90-130 (Patient taking differently: Inject 48 Units into the skin daily. ) 15 mL 8  . Insulin Pen Needle (B-D UF III MINI PEN NEEDLES) 31G X 5 MM MISC Use to inject insulin daily. 100 each 0  . Insulin Syringe-Needle U-100 (B-D INS SYRINGE 0.5CC/31GX5/16) 31G X 5/16" 0.5 ML MISC 1 each by Does not apply route at bedtime. 30 each 2  . JANUVIA 100 MG tablet  TAKE 1 TABLET BY MOUTH EVERY DAY (Patient taking differently: Take 100 mg by mouth every evening. ) 90 tablet 0  . metFORMIN (GLUCOPHAGE) 1000 MG tablet TAKE 1 TABLET (1,000 MG TOTAL) BY MOUTH 2 (TWO) TIMES DAILY WITH A MEAL. 180 tablet 0  . metoprolol succinate (TOPROL XL) 25 MG 24 hr tablet Take 1 tablet (25 mg total) by mouth daily. 90 tablet 3  . nitroGLYCERIN (NITROSTAT) 0.4 MG SL tablet Place 1 tablet (0.4 mg total) under the tongue every 5 (five) minutes as needed. 25 tablet 2   . rosuvastatin (CRESTOR) 40 MG tablet Take 1 tablet (40 mg total) by mouth at bedtime. 30 tablet 11  . sildenafil (VIAGRA) 50 MG tablet Take 50 mg by mouth daily as needed for erectile dysfunction.    . Turmeric 500 MG CAPS Take 1,000 mg by mouth every evening.    . empagliflozin (JARDIANCE) 10 MG TABS tablet Take 10 mg by mouth daily before breakfast. 30 tablet 2   No current facility-administered medications for this visit.   Allergies:  Codeine   Social History: The patient  reports that he has never smoked. He has never used smokeless tobacco. He reports current alcohol use. He reports that he does not use drugs.   Family History: The patient's family history includes Diabetes in his mother; Heart attack in his maternal grandfather and paternal grandfather; Hypertension in his father; Sleep apnea in an other family member.   ROS:  Please see the history of present illness. Otherwise, complete review of systems is positive for none.  All other systems are reviewed and negative.   Physical Exam: VS:  BP 120/74   Pulse 72   Ht 5' 10.5" (1.791 m)   Wt 207 lb (93.9 kg)   SpO2 99%   BMI 29.28 kg/m , BMI Body mass index is 29.28 kg/m.  Wt Readings from Last 3 Encounters:  09/08/19 207 lb (93.9 kg)  08/26/19 208 lb (94.3 kg)  08/24/19 208 lb (94.3 kg)    General: Patient appears comfortable at rest. Neck: Supple, no elevated JVP or carotid bruits, no thyromegaly. Lungs: Clear to auscultation, nonlabored breathing at rest. Cardiac: Regular rate and rhythm, no S3 or significant systolic murmur, no pericardial rub. Extremities: No pitting edema, distal pulses 2+. Skin: Warm and dry.  Right radial access site clean and dry with good pulses. Musculoskeletal: No kyphosis. Neuropsychiatric: Alert and oriented x3, affect grossly appropriate.  ECG:  An ECG dated 09/08/2019 was personally reviewed today and demonstrated:  Normal sinus rhythm rate of 61, no acute ST or T wave abnormalities,  normal axis, no voltage criteria for LVH.  Recent Labwork: 12/29/2018: ALT 17; AST 15 08/24/2019: BUN 15; Creatinine, Ser 0.80; Hemoglobin 15.1; Platelets 209; Potassium 4.2; Sodium 139     Component Value Date/Time   CHOL 145 12/29/2018 1353   TRIG 77 12/29/2018 1353   HDL 39 (L) 12/29/2018 1353   CHOLHDL 3.7 12/29/2018 1353   CHOLHDL 4.9 08/12/2016 1704   VLDL 23 08/12/2016 1704   LDLCALC 91 12/29/2018 1353    Other Studies Reviewed Today: Cardiac Catheterization 08/26/2019:   The left ventricular systolic function is normal.  LV end diastolic pressure is normal.  The left ventricular ejection fraction is 55-65% by visual estimate.  1st Diag lesion is 60% stenosed.  Mid LAD-1 lesion is 40% stenosed.  Mid LAD-2 lesion is 80% stenosed.  Post intervention, there is a 0% residual stenosis.  A drug-eluting stent was successfully placed  using a STENT RESOLUTE ONYX 2.5X22.  1. Significant one-vessel coronary artery disease involving the mid to distal LAD. The left circumflex is very small. However, the RCA is very large and super dominant. There is also 60% stenosis in a large first diagonal and moderate mid LAD stenosis 2. Normal LV systolic function and left ventricular end-diastolic pressure. 3. Successful angioplasty and drug-eluting stent placement to the mid/distal LAD.  Recommendations: Dual antiplatelet therapy for at least 6 months. The patient needs aggressive treatment of his risk factors especially with the presence of residual coronary artery disease. He needs to be in a potent statin and I elected to switch him to rosuvastatin from pravastatin. The patient is a candidate for same-day discharge.  Diagnostic Dominance: Right  Intervention    Echocardiogram 08/22/2019  1. Left ventricular ejection fraction, by visual estimation, is 55 to 60%. The left ventricle has normal function. There is no left ventricular hypertrophy. 2. The left ventricle has  no regional wall motion abnormalities. 3. Global right ventricle has normal systolic function.The right ventricular size is normal. 4. Left atrial size was normal. 5. Right atrial size was normal. 6. The mitral valve is normal in structure. Trivial mitral valve regurgitation. No evidence of mitral stenosis. 7. The tricuspid valve is normal in structure. Tricuspid valve regurgitation is trivial. 8. The aortic valve is tricuspid. Aortic valve regurgitation is not visualized. No evidence of aortic valve sclerosis or stenosis. 9. The pulmonic valve was normal in structure. Pulmonic valve regurgitation is trivial. 10. Aortic dilatation noted. 11. There is mild dilatation of the aortic root measuring 38 mm. 12. The inferior vena cava is normal in size with greater than 50% respiratory variability, suggesting right atrial pressure of 3 mmHg. 13. Normal LV function; no significant valvular abnormality. In comparison to the previous echocardiogram(s): 05/10/14 EF 55-60%.   Assessment and Plan:  1. Stented coronary artery   2. Coronary artery disease due to lipid rich plaque   3. Dyslipidemia associated with type 2 diabetes mellitus ([place])    1. Stented coronary artery mLAD #2  He was placed on aspirin, Plavix, and Crestor.  He is scheduled for FLP and LFTs in early April.  He denies any current chest pain, chest pressure, chest tightness, neck, arm, back, or jaw pain.  States he is feeling better since the cath.  He had questions during the visit which were all answered and he verbalized understanding.   2. Coronary artery disease due to lipid rich plaque CAD with recent LAD stent to mid LAD.  Residual disease and more proximal portion of the mid LAD of 40% and first diagonal with 60% stenosis.  Continue aspirin 81 mg, Plavix 75 mg, rosuvastatin 40 mg daily.  Take nitroglycerin sublingual as needed for chest pain  3. Dyslipidemia associated with type 2 diabetes mellitus ([place]) Patient is on  Crestor recently started on 08/26/2019.  He has a fasting lipid profile and liver function test scheduled for October 06, 2019.  Medication Adjustments/Labs and Tests Ordered: Current medicines are reviewed at length with the patient today.  Concerns regarding medicines are outlined above.   Disposition: Follow-up with Dr Radford Pax or APP  Signed, Levell July, NP 09/08/2019 12:09 PM    [place]

## 2019-09-09 ENCOUNTER — Telehealth: Payer: Self-pay | Admitting: Internal Medicine

## 2019-09-09 DIAGNOSIS — E119 Type 2 diabetes mellitus without complications: Secondary | ICD-10-CM

## 2019-09-09 MED ORDER — BD PEN NEEDLE MINI U/F 31G X 5 MM MISC: 0 refills | Status: DC

## 2019-09-09 NOTE — Telephone Encounter (Signed)
Patient wife called and requested for INSULIN PEN NEEDLES 31g X 5MM to be sent to [facility] in [place] off of [address]. Patients wife stated that [facility] out until 03/16/202. Her husband is currently out of the needles. She was informed by the pharmacy to call and try to get a 7 day supply sent to a local pharmacy for sooner use of the pen needles.

## 2019-09-09 NOTE — Telephone Encounter (Signed)
Refill sent.

## 2019-09-09 NOTE — Telephone Encounter (Signed)
Can you please fill

## 2019-09-12 ENCOUNTER — Telehealth (HOSPITAL_COMMUNITY): Payer: Self-pay

## 2019-09-12 NOTE — Telephone Encounter (Signed)
Called pt to see if he was interested in the cardiac rehab program left a message for pt to call back. If pt is interested pt has to contact his insurance [employer] to make sure they will cover the cardiac rehab program.

## 2019-09-12 NOTE — Telephone Encounter (Signed)
-----   Message from Sueanne Margarita, MD sent at 09/11/2019  6:53 PM EDT ----- Regarding: RE: Cardiac Rehab He had wide complex tachycardia on en event monitor done for syncope which led to the coronary CTA and subsequent cath and PCI therefore CAD is etiology of WCT and syncope and no further workup done and EP consult cancelled.  Traci ----- Message ----- From: Rowe Pavy, RN Sent: 09/11/2019   4:07 PM EDT To: Sueanne Margarita, MD Subject: Cardiac Rehab                                  Hi Dr. Radford Pax,  Your pt from above s/p 2/26 DES LAD had an appt on 3/5 with Dr. Lovena Le.  Appt notes say VT.  This appt was canceled by the provider - no other reason given.  Pt seen on 3/11 by Levell July NP.  There is no mentioning of any EP related issues or follow up.  Will see you in May. Does this consult need to be rescheduled?  If not, okay for pt to participate in Cardiac rehab?  Thanks for your guidance  Maurice Small RN, BSN Cardiac and Pulmonary Rehab Nurse Navigator

## 2019-09-16 ENCOUNTER — Encounter (HOSPITAL_COMMUNITY): Payer: Self-pay

## 2019-09-16 NOTE — Telephone Encounter (Signed)
Attempted to call pt a 2nd time in regards to Cardiac rehab- LM ON VM  Mailed letter out

## 2019-10-03 NOTE — Telephone Encounter (Signed)
No response from pt regarding CR.  Closed referral.  

## 2019-10-06 ENCOUNTER — Other Ambulatory Visit

## 2019-10-13 ENCOUNTER — Ambulatory Visit: Admitting: Cardiology

## 2019-10-14 ENCOUNTER — Other Ambulatory Visit: Payer: Self-pay

## 2019-10-14 ENCOUNTER — Encounter: Payer: Self-pay | Admitting: Internal Medicine

## 2019-10-14 ENCOUNTER — Ambulatory Visit: Attending: Internal Medicine | Admitting: Internal Medicine

## 2019-10-14 ENCOUNTER — Other Ambulatory Visit: Admitting: *Deleted

## 2019-10-14 VITALS — BP 124/75 | HR 75 | Temp 98.2°F | Resp 16 | Wt 203.2 lb

## 2019-10-14 DIAGNOSIS — E785 Hyperlipidemia, unspecified: Secondary | ICD-10-CM

## 2019-10-14 DIAGNOSIS — I2583 Coronary atherosclerosis due to lipid rich plaque: Secondary | ICD-10-CM

## 2019-10-14 DIAGNOSIS — Z9989 Dependence on other enabling machines and devices: Secondary | ICD-10-CM

## 2019-10-14 DIAGNOSIS — G4733 Obstructive sleep apnea (adult) (pediatric): Secondary | ICD-10-CM | POA: Diagnosis not present

## 2019-10-14 DIAGNOSIS — Z794 Long term (current) use of insulin: Secondary | ICD-10-CM

## 2019-10-14 DIAGNOSIS — E119 Type 2 diabetes mellitus without complications: Secondary | ICD-10-CM

## 2019-10-14 DIAGNOSIS — I2510 Atherosclerotic heart disease of native coronary artery without angina pectoris: Secondary | ICD-10-CM

## 2019-10-14 DIAGNOSIS — E1159 Type 2 diabetes mellitus with other circulatory complications: Secondary | ICD-10-CM | POA: Diagnosis not present

## 2019-10-14 DIAGNOSIS — E1169 Type 2 diabetes mellitus with other specified complication: Secondary | ICD-10-CM

## 2019-10-14 DIAGNOSIS — N529 Male erectile dysfunction, unspecified: Secondary | ICD-10-CM

## 2019-10-14 DIAGNOSIS — L249 Irritant contact dermatitis, unspecified cause: Secondary | ICD-10-CM

## 2019-10-14 LAB — LIPID PANEL
Chol/HDL Ratio: 3.1 ratio (ref 0.0–5.0)
Cholesterol, Total: 101 mg/dL (ref 100–199)
HDL: 33 mg/dL — ABNORMAL LOW (ref >39)
LDL Chol Calc (NIH): 54 mg/dL (ref 0–99)
Triglycerides: 63 mg/dL (ref 0–149)
VLDL Cholesterol Cal: 14 mg/dL (ref 5–40)

## 2019-10-14 LAB — GLUCOSE, POCT (MANUAL RESULT ENTRY): POC Glucose: 98 mg/dl (ref 70–99)

## 2019-10-14 LAB — ALT: ALT: 57 IU/L — ABNORMAL HIGH (ref 0–44)

## 2019-10-14 NOTE — Patient Instructions (Signed)
You can purchase and use some hydrocortisone cream for the dryness on the right index finger.  Always wear protective gloves if required to do your work.  Continue to monitor blood sugars.  If morning blood sugars continue to fall below 70, I would first to decrease the Amaryl.

## 2019-10-14 NOTE — Progress Notes (Signed)
[name], male    DOB: March 24, 1971  MRN: [id number]  CC: Diabetes and Hypertension   Subjective: Paul [name] is a 49 y.o. male who presents for chronic ds management His concerns today include:  Hx of DM, HL,OSA on CPAP, ED (Viagra through [place]), CAD  Since last visit with me, patient had cardiac catheterization in February.  This revealed three-vessel coronary artery disease with the highest grade of stenosis being 80% in the mid LAD.  He received a DES.  Currently on dual antiplatelet agent, Crestor, metoprolol.  He has not had to use any sublingual nitroglycerin since his procedure.  Reports that he is doing and feeling well.  He has seen cardiology in follow-up since then. He is on Viagra and reports he was told by the cardiologist to make sure not to take that within several hours of having to take sublingual nitroglycerin but so far he has not had to use nitroglycerin.  DM: He brings in some of his blood sugar readings that are recorded on his phone.  His morning blood sugar readings show a wide fluctuation with about half of them being in the 70s.  Other readings include 132, 142, 175, 205.  Nighttime blood sugar range is 96-213.  He decrease Lantus insulin from 48 units to 40 units and reports that since then, he has had less morning hypoglycemia.  His other medications include Metformin, Jardiance, Amaryl and Januvia.  He reports that he is doing well with his eating habits.  He has not been able to exercise much because of his work but plans to start again.  OSA: Reports compliance with his CPAP machine.  He feels well and rested with using his machine.  He complains of dry skin and some cracking of the right index finger.  He attributes this to the skin being exposed to high percent alcohol at work.  He normally has to wear gloves at work because they do use chemicals.  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   Patient Active Problem List   Diagnosis Date Noted  . CAD (coronary artery disease) 09/08/2019  . Angina pectoris ([place])   . Erectile dysfunction 08/12/2016  . Polyp of colon 08/12/2016  . Obstructive sleep apnea 04/03/2015  . Onychomycosis of right great toe 04/03/2015  . Diabetes type 2, uncontrolled ([place]) 01/25/2014  . Dyslipidemia associated with type 2 diabetes mellitus ([place]) 01/25/2014  . Skin tag 01/25/2014     Current Outpatient Medications on File Prior to Visit  Medication Sig Dispense Refill  . aspirin EC 81 MG tablet Take 1 tablet (81 mg total) by mouth daily. 90 tablet 3  . clopidogrel (PLAVIX) 75 MG tablet Take 1 tablet (75 mg total) by mouth daily. 90 tablet 3  . empagliflozin (JARDIANCE) 10 MG TABS tablet Take 10 mg by mouth daily before breakfast. 30 tablet 2  . glimepiride (AMARYL) 4 MG tablet Take 1 tablet (4 mg total) by mouth daily with breakfast. 90 tablet 0  . Homeopathic Products [facility] RELIEF EX) Apply 1 application topically daily as needed (muscle cramps / pains).    . Insulin Glargine (LANTUS SOLOSTAR) 100 UNIT/ML Solostar Pen INJECT 40 UNITS UNDER THE SKIN DAILY. INCREASE IN 2 UNIT INCREMENTS EVERY 3 DAYS UNTIL MORNING BLOOD SUGAR CONSISTENTLY BETWEEN 90-130 (Patient taking differently: Inject 48 Units into the skin daily. ) 15  mL 8  . Insulin Pen Needle (B-D UF III MINI PEN NEEDLES) 31G X 5 MM MISC Use to inject insulin daily. 100 each 0  . Insulin Syringe-Needle U-100 (B-D INS SYRINGE 0.5CC/31GX5/16) 31G X 5/16" 0.5 ML MISC 1 each by Does not apply route at bedtime. 30 each 2  . JANUVIA 100 MG tablet TAKE 1 TABLET BY MOUTH EVERY DAY (Patient taking differently: Take 100 mg by mouth every evening. ) 90 tablet 0  . metFORMIN (GLUCOPHAGE) 1000 MG tablet TAKE 1 TABLET (1,000 MG TOTAL) BY MOUTH 2 (TWO) TIMES DAILY WITH A MEAL. 180 tablet 0  . metoprolol succinate (TOPROL XL) 25 MG 24 hr tablet Take 1  tablet (25 mg total) by mouth daily. 90 tablet 3  . nitroGLYCERIN (NITROSTAT) 0.4 MG SL tablet Place 1 tablet (0.4 mg total) under the tongue every 5 (five) minutes as needed. 25 tablet 2  . rosuvastatin (CRESTOR) 40 MG tablet Take 1 tablet (40 mg total) by mouth at bedtime. 90 tablet 3  . sildenafil (VIAGRA) 50 MG tablet Take 50 mg by mouth daily as needed for erectile dysfunction.    . Turmeric 500 MG CAPS Take 1,000 mg by mouth every evening.     No current facility-administered medications on file prior to visit.    Allergies  Allergen Reactions  . Codeine Nausea And Vomiting and Other (See Comments)    headache    Social History   Socioeconomic History  . Marital status: Married    Spouse name: Not on file  . Number of children: 2  . Years of education: Not on file  . Highest education level: Not on file  Occupational History  . Occupation: Animal nutritionist  Tobacco Use  . Smoking status: Never Smoker  . Smokeless tobacco: Never Used  Substance and Sexual Activity  . Alcohol use: Yes    Comment: Social  . Drug use: No  . Sexual activity: Yes  Other Topics Concern  . Not on file  Social History Narrative  . Not on file   Social Determinants of Health   Financial Resource Strain:   . Difficulty of Paying Living Expenses:   Food Insecurity:   . Worried About Charity fundraiser in the Last Year:   . Arboriculturist in the Last Year:   Transportation Needs:   . Film/video editor (Medical):   Marland Kitchen Lack of Transportation (Non-Medical):   Physical Activity:   . Days of Exercise per Week:   . Minutes of Exercise per Session:   Stress:   . Feeling of Stress :   Social Connections:   . Frequency of Communication with Friends and Family:   . Frequency of Social Gatherings with Friends and Family:   . Attends Religious Services:   . Active Member of Clubs or Organizations:   . Attends Archivist Meetings:   Marland Kitchen Marital Status:   Intimate Partner  Violence:   . Fear of Current or Ex-Partner:   . Emotionally Abused:   Marland Kitchen Physically Abused:   . Sexually Abused:     Family History  Problem Relation Age of Onset  . Hypertension Father   . Diabetes Mother   . Sleep apnea Other   . Heart attack Maternal Grandfather   . Heart attack Paternal Grandfather   . Cancer Neg Hx   . Colon cancer Neg Hx   . Esophageal cancer Neg Hx   . Stomach cancer Neg Hx   .  Rectal cancer Neg Hx     Past Surgical History:  Procedure Laterality Date  . COLONOSCOPY    . CORONARY STENT INTERVENTION N/A 08/26/2019   Procedure: CORONARY STENT INTERVENTION;  Surgeon: Wellington Hampshire, MD;  Location: [facility];  Service: Cardiovascular;  Laterality: N/A;  . KNEE ARTHROSCOPY Right   . LEFT HEART CATH AND CORONARY ANGIOGRAPHY N/A 08/26/2019   Procedure: LEFT HEART CATH AND CORONARY ANGIOGRAPHY;  Surgeon: Wellington Hampshire, MD;  Location: [facility];  Service: Cardiovascular;  Laterality: N/A;  . UMBILICAL HERNIA REPAIR      ROS: Review of Systems Negative except as stated above  PHYSICAL EXAM: BP 124/75   Pulse 75   Temp 98.2 F (36.8 C)   Resp 16   Wt 203 lb 3.2 oz (92.2 kg)   SpO2 96%   BMI 28.74 kg/m   Physical Exam  General appearance - alert, well appearing, and in no distress Mental status - normal mood, behavior, speech, dress, motor activity, and thought processes Neck - supple, no significant adenopathy Chest - clear to auscultation, no wheezes, rales or rhonchi, symmetric air entry Heart - normal rate, regular rhythm, normal S1, S2, no murmurs, rubs, clicks or gallops Extremities - peripheral pulses normal, no pedal edema, no clubbing or cyanosis Skin: Small area of cracking and dryness on the mid lateral surface of the right index finger. CMP Latest Ref Rng & Units 08/24/2019 07/22/2019 07/04/2019  Glucose 65 - 99 mg/dL 256(H) 100(H) 321(H)  BUN 6 - 24 mg/dL 15 17 15   Creatinine 0.76 - 1.27 mg/dL 0.80 0.85 0.83    Sodium 134 - 144 mmol/L 139 146(H) 136  Potassium 3.5 - 5.2 mmol/L 4.2 4.3 3.7  Chloride 96 - 106 mmol/L 103 103 100  CO2 20 - 29 mmol/L 23 25 23   Calcium 8.7 - 10.2 mg/dL 9.2 9.8 9.1  Total Protein 6.0 - 8.5 g/dL - - -  Total Bilirubin 0.0 - 1.2 mg/dL - - -  Alkaline Phos 39 - 117 IU/L - - -  AST 0 - 40 IU/L - - -  ALT 0 - 44 IU/L - - -   Lipid Panel     Component Value Date/Time   CHOL 145 12/29/2018 1353   TRIG 77 12/29/2018 1353   HDL 39 (L) 12/29/2018 1353   CHOLHDL 3.7 12/29/2018 1353   CHOLHDL 4.9 08/12/2016 1704   VLDL 23 08/12/2016 1704   LDLCALC 91 12/29/2018 1353    CBC    Component Value Date/Time   WBC 7.9 08/24/2019 1324   WBC 10.3 07/04/2019 0251   RBC 4.93 08/24/2019 1324   RBC 4.89 07/04/2019 0251   HGB 15.1 08/24/2019 1324   HCT 43.2 08/24/2019 1324   PLT 209 08/24/2019 1324   MCV 88 08/24/2019 1324   MCH 30.6 08/24/2019 1324   MCH 30.7 07/04/2019 0251   MCHC 35.0 08/24/2019 1324   MCHC 36.3 (H) 07/04/2019 0251   RDW 12.3 08/24/2019 1324   LYMPHSABS 2.3 01/25/2014 1541   MONOABS 0.6 01/25/2014 1541   EOSABS 0.5 01/25/2014 1541   BASOSABS 0.1 01/25/2014 1541    ASSESSMENT AND PLAN:  1. Type 2 diabetes mellitus with other circulatory complication, with long-term current use of insulin ([place]) Advised patient to continue monitoring his blood sugars.  If after he starts exercising he finds that his morning blood sugars are still running low, I recommend decreasing the Amaryl or stopping it altogether.  He will  continue on the Lantus 40 units daily, the Januvia, Metformin, and Jardiance. - Hemoglobin A1c - POCT glucose (manual entry)  2. Coronary artery disease involving native coronary artery of native heart without angina pectoris Stable and on appropriate drug regimen through his cardiologist. I to advise patient that if he has to take a sublingual nitroglycerin he should be sure not to use Viagra that day  3. OSA on CPAP Stable and benefiting  from CPAP  4. Erectile dysfunction, unspecified erectile dysfunction type See #2 above  5. Irritant dermatitis Recommend wearing protective gloves at work.  Recommend purchasing some hydrocortisone cream over-the-counter and using it as needed.    Patient was given the opportunity to ask questions.  Patient verbalized understanding of the plan and was able to repeat key elements of the plan.   Orders Placed This Encounter  Procedures  . Hemoglobin A1c  . POCT glucose (manual entry)     Requested Prescriptions    No prescriptions requested or ordered in this encounter    No follow-ups on file.  Karle Plumber, MD, FACP

## 2019-10-15 LAB — HEMOGLOBIN A1C
Est. average glucose Bld gHb Est-mCnc: 154 mg/dL
Hgb A1c MFr Bld: 7 % — ABNORMAL HIGH (ref 4.8–5.6)

## 2019-10-18 ENCOUNTER — Telehealth: Payer: Self-pay

## 2019-10-18 DIAGNOSIS — E1169 Type 2 diabetes mellitus with other specified complication: Secondary | ICD-10-CM

## 2019-10-18 NOTE — Telephone Encounter (Signed)
-----   Message from Sueanne Margarita, MD sent at 10/14/2019  5:31 PM EDT ----- Lipids at goal but LFTs mildly elevated.  Please repeat LFTs in 2 weeks and continue current therapy and forward to PCP

## 2019-10-18 NOTE — Telephone Encounter (Signed)
Left message for patient to call back  

## 2019-10-24 ENCOUNTER — Telehealth: Payer: Self-pay | Admitting: Cardiology

## 2019-10-24 NOTE — Telephone Encounter (Signed)
   Pt is returning call from [facility], her said regarding lab results. He said he is at work and can only use phone during breaks. He said to call his wife instead to get information on his behalf, there's DPR on  File   Please call

## 2019-10-24 NOTE — Telephone Encounter (Signed)
Left message for wife. Results have been reviewed by the patient in [place]. Advised to call back with any questions.

## 2019-10-24 NOTE — Telephone Encounter (Signed)
I believe pt may be returning call to Dr. Theodosia Blender nurse Bethann Berkshire, RN.

## 2019-11-03 ENCOUNTER — Telehealth: Payer: Self-pay

## 2019-11-03 NOTE — Telephone Encounter (Signed)
Left message for patient to call back. His appointment with Dr. Radford Pax on 05/13 needs to be rescheduled.

## 2019-11-04 ENCOUNTER — Other Ambulatory Visit: Admitting: *Deleted

## 2019-11-04 ENCOUNTER — Other Ambulatory Visit: Payer: Self-pay

## 2019-11-04 DIAGNOSIS — E1169 Type 2 diabetes mellitus with other specified complication: Secondary | ICD-10-CM

## 2019-11-04 LAB — HEPATIC FUNCTION PANEL
ALT: 234 IU/L — ABNORMAL HIGH (ref 0–44)
AST: 153 IU/L — ABNORMAL HIGH (ref 0–40)
Albumin: 3.8 g/dL — ABNORMAL LOW (ref 4.0–5.0)
Alkaline Phosphatase: 78 IU/L (ref 39–117)
Bilirubin Total: 0.5 mg/dL (ref 0.0–1.2)
Bilirubin, Direct: 0.15 mg/dL (ref 0.00–0.40)
Total Protein: 5.8 g/dL — ABNORMAL LOW (ref 6.0–8.5)

## 2019-11-08 ENCOUNTER — Telehealth: Payer: Self-pay | Admitting: Gastroenterology

## 2019-11-08 NOTE — Telephone Encounter (Signed)
I returned the pt call on 2 occasions today. I will await further communication from the pt.

## 2019-11-08 NOTE — Telephone Encounter (Signed)
Left message on machine to call back  

## 2019-11-08 NOTE — Telephone Encounter (Signed)
Patient called stated he has elevated LFT's and was told to stop his medication Crestor and would like get advise

## 2019-11-09 ENCOUNTER — Telehealth: Payer: Self-pay | Admitting: Internal Medicine

## 2019-11-09 ENCOUNTER — Telehealth: Payer: Self-pay

## 2019-11-09 DIAGNOSIS — E1169 Type 2 diabetes mellitus with other specified complication: Secondary | ICD-10-CM

## 2019-11-09 DIAGNOSIS — R7989 Other specified abnormal findings of blood chemistry: Secondary | ICD-10-CM

## 2019-11-09 DIAGNOSIS — E785 Hyperlipidemia, unspecified: Secondary | ICD-10-CM

## 2019-11-09 DIAGNOSIS — R945 Abnormal results of liver function studies: Secondary | ICD-10-CM

## 2019-11-09 NOTE — Telephone Encounter (Signed)
-----   Message from Sueanne Margarita, MD sent at 11/06/2019  8:30 PM EDT ----- LFTs are elevated - please have patient hold Crestor and get in with [facility] fro assessment.  Please tell him to call us as soon as he has seen GI so we can address lipids treatment further

## 2019-11-09 NOTE — Telephone Encounter (Signed)
Phone call placed to patient today.  I left a message on his cell phone informing him that I received the results of his liver enzyme test from Dr. Radford Pax.  She wants him to see the gastroenterologist and he can let me know if he needs me to submit the referral for that.  In the meantime I agree with stopping the Crestor and returning to the lab in 1 week to have the liver enzymes repeated.  Additionally when I draw blood I will check for viral hepatitis.  If the elevation is due to the Crestor which should come down once he has been off of the medication.  Advised to call me or send me a message through MyChart if he has any questions.

## 2019-11-09 NOTE — Telephone Encounter (Signed)
-----   Message from Sueanne Margarita, MD sent at 11/09/2019 10:51 AM EDT ----- Please get him in to lipid clinic  [place] ----- Message ----- From: Antonieta Iba, RN Sent: 11/09/2019  10:31 AM EDT To: Sueanne Margarita, MD  Spoke with [facility] and they currently don't have a sooner appointment but will try to get him in sooner. They have also been trying to get in touch with the patient to discuss his elevated LFTs

## 2019-11-09 NOTE — Telephone Encounter (Signed)
Left message for patient letting him know that Dr. Radford Pax would like him to be seen by lipid clinic.

## 2019-11-10 ENCOUNTER — Encounter: Payer: Self-pay | Admitting: Internal Medicine

## 2019-11-10 ENCOUNTER — Ambulatory Visit: Admitting: Cardiology

## 2019-11-14 ENCOUNTER — Other Ambulatory Visit: Payer: Self-pay

## 2019-11-14 ENCOUNTER — Ambulatory Visit: Attending: Internal Medicine

## 2019-11-14 DIAGNOSIS — R7989 Other specified abnormal findings of blood chemistry: Secondary | ICD-10-CM

## 2019-11-14 DIAGNOSIS — R945 Abnormal results of liver function studies: Secondary | ICD-10-CM

## 2019-11-14 DIAGNOSIS — E1169 Type 2 diabetes mellitus with other specified complication: Secondary | ICD-10-CM

## 2019-11-15 ENCOUNTER — Ambulatory Visit (INDEPENDENT_AMBULATORY_CARE_PROVIDER_SITE_OTHER): Admitting: Pharmacist

## 2019-11-15 DIAGNOSIS — E785 Hyperlipidemia, unspecified: Secondary | ICD-10-CM | POA: Diagnosis not present

## 2019-11-15 DIAGNOSIS — E1169 Type 2 diabetes mellitus with other specified complication: Secondary | ICD-10-CM

## 2019-11-15 DIAGNOSIS — R7989 Other specified abnormal findings of blood chemistry: Secondary | ICD-10-CM | POA: Diagnosis not present

## 2019-11-15 LAB — HEPATIC FUNCTION PANEL
ALT: 59 IU/L — ABNORMAL HIGH (ref 0–44)
AST: 20 IU/L (ref 0–40)
Albumin: 4.3 g/dL (ref 4.0–5.0)
Alkaline Phosphatase: 66 IU/L (ref 48–121)
Bilirubin Total: 0.5 mg/dL (ref 0.0–1.2)
Bilirubin, Direct: 0.15 mg/dL (ref 0.00–0.40)
Total Protein: 6.1 g/dL (ref 6.0–8.5)

## 2019-11-15 LAB — HEPATITIS PANEL, ACUTE
Hep A IgM: NEGATIVE
Hep B C IgM: NEGATIVE
Hep C Virus Ab: <0.1 s/co ratio (ref 0.0–0.9)
Hepatitis B Surface Ag: NEGATIVE

## 2019-11-15 LAB — LIPID PANEL
Chol/HDL Ratio: 4.1 ratio (ref 0.0–5.0)
Cholesterol, Total: 158 mg/dL (ref 100–199)
HDL: 39 mg/dL — ABNORMAL LOW (ref >39)
LDL Chol Calc (NIH): 101 mg/dL — ABNORMAL HIGH (ref 0–99)
Triglycerides: 94 mg/dL (ref 0–149)
VLDL Cholesterol Cal: 18 mg/dL (ref 5–40)

## 2019-11-15 MED ORDER — ROSUVASTATIN CALCIUM 10 MG PO TABS: 10.0000 mg | ORAL_TABLET | Freq: Every day | ORAL | 0 refills | Status: DC

## 2019-11-15 NOTE — Patient Instructions (Addendum)
It was nice to meet you today  Your LDL cholesterol goal is < 70  Start taking rosuvastatin 10mg  once a day  We will keep a close eye on your liver enzymes and check these next week when you see Dr Radford Pax

## 2019-11-15 NOTE — Progress Notes (Signed)
Patient ID: [name]                 DOB: 29-Sep-1970                    MRN: [id number]     HPI: Paul [name] is a 49 y.o. male patient referred to lipid clinic by Dr Radford Pax. PMH is significant for DM, HLD, OSA on CPAP, chest pain, coronary calcium score of 8 which was 67th percentile matched for age and sex with moderate atherosclerosis of the mid to distal LAD and D1, FFR showed possible flow limiting lesions in the LAD and D1. He underwent cardiac cath 08/26/19 which showed significant single vessel disease involving the mid to distal LAD which was treated with DES.  Pt presents today in good spirits. He previously took pravastatin 40mg  daily and tolerated therapy well. This was changed to high intensity rosuvastatin 08/26/19 after he had a stent placed. Baseline LDL on pravastatin at this time was 91. LDL improved to 54 after changing to rosuvastatin 40mg  daily, however ALT increased to 234. He was advised to stop his rosuvastatin on 5/7 and had follow up labs again on 5/17. ALT had dropped back to 59. He does report family history of fatty liver in his grandmother and mother. Has an appt with GI scheduled for next week on 5/27.  Current Medications: rosuvastatin 40mg  daily (started 08/26/19) Intolerances: previously took pravastatin 40mg , has also taken atorvastatin 20mg  daily (blood sugar elevation) and gemfibrozil 600mg  BID Risk Factors: CAD s/p PCI, elevated calcium score, DM, family history of CAD LDL goal: 70mg /dL  Diet: Salads, chicken, peanut butter, steel rolled oatmeal, 2 eggs most days a week, water, tea (sticking to unsweet tea), milk. Goes out to fast food for lunch because of his work schedule but gets grilled chicken salads.  Exercise: Goes to the gym and walks  Family History: The patient's family history includes Diabetes in his mother; Heart attack in his maternal grandfather and paternal grandfather; Hypertension in his father; Sleep apnea in an other family  member.   Social History: The patient  reports that he has never smoked. He has never used smokeless tobacco. He reports current alcohol use. He reports that he does not use drugs.   Labs: 11/14/19: TC 158, TG 94, HDL 39, LDL 101, AST 20, ALT 59 (off of rosuvastatin 40mg  for 1 week) 11/04/19: AST 153, ALT 234 - advised to stop rosuvastatin, referred to GI 10/14/19: TC 101, TG 63, HDL 33, LDL 54, ALT 57 (rosuvastatin 40mg  daily) 12/29/18: TC 145, TG 77, HDL 39, LDL 91, AST 15, ALT 17 (pravastatin 40mg  daily)  Past Medical History:  Diagnosis Date  . Diabetes mellitus without complication ([place])   . Hyperlipidemia   . Kidney stones   . Sleep apnea    wears CPAP    Current Outpatient Medications on File Prior to Visit  Medication Sig Dispense Refill  . aspirin EC 81 MG tablet Take 1 tablet (81 mg total) by mouth daily. 90 tablet 3  . clopidogrel (PLAVIX) 75 MG tablet Take 1 tablet (75 mg total) by mouth daily. 90 tablet 3  . empagliflozin (JARDIANCE) 10 MG TABS tablet Take 10 mg by mouth daily before breakfast. 30 tablet 2  . glimepiride (AMARYL) 4 MG tablet Take 1 tablet (4 mg total) by mouth daily with breakfast. 90 tablet 0  . Homeopathic Products [facility] RELIEF EX) Apply 1 application topically daily as needed (muscle cramps /  pains).    . Insulin Glargine (LANTUS SOLOSTAR) 100 UNIT/ML Solostar Pen INJECT 40 UNITS UNDER THE SKIN DAILY. INCREASE IN 2 UNIT INCREMENTS EVERY 3 DAYS UNTIL MORNING BLOOD SUGAR CONSISTENTLY BETWEEN 90-130 (Patient taking differently: Inject 48 Units into the skin daily. ) 15 mL 8  . Insulin Pen Needle (B-D UF III MINI PEN NEEDLES) 31G X 5 MM MISC Use to inject insulin daily. 100 each 0  . Insulin Syringe-Needle U-100 (B-D INS SYRINGE 0.5CC/31GX5/16) 31G X 5/16" 0.5 ML MISC 1 each by Does not apply route at bedtime. 30 each 2  . JANUVIA 100 MG tablet TAKE 1 TABLET BY MOUTH EVERY DAY (Patient taking differently: Take 100 mg by mouth every evening. ) 90 tablet 0  .  metFORMIN (GLUCOPHAGE) 1000 MG tablet TAKE 1 TABLET (1,000 MG TOTAL) BY MOUTH 2 (TWO) TIMES DAILY WITH A MEAL. 180 tablet 0  . metoprolol succinate (TOPROL XL) 25 MG 24 hr tablet Take 1 tablet (25 mg total) by mouth daily. 90 tablet 3  . nitroGLYCERIN (NITROSTAT) 0.4 MG SL tablet Place 1 tablet (0.4 mg total) under the tongue every 5 (five) minutes as needed. 25 tablet 2  . rosuvastatin (CRESTOR) 40 MG tablet Take 1 tablet (40 mg total) by mouth at bedtime. 90 tablet 3  . sildenafil (VIAGRA) 50 MG tablet Take 50 mg by mouth daily as needed for erectile dysfunction.    . Turmeric 500 MG CAPS Take 1,000 mg by mouth every evening.     No current facility-administered medications on file prior to visit.    Allergies  Allergen Reactions  . Codeine Nausea And Vomiting and Other (See Comments)    headache    Assessment/Plan:  1. Hyperlipidemia - LDL was at goal <70 on rosuvastatin 40mg  daily, however LFTs increased to 5-6x ULN. Pt is seeing GI next week. LFTs previously stable on pravastatin 40mg  daily and LDL improved to 91, however he still needed a stent placed while on this therapy. Discussed that LFT increase can be dose-dependent with statin therapy and that typically rosuvastatin is less likely than other statins to cause LFT elevations. Discussed statin rechallenge, Zetia (monotherapy would not be enough to bring LDL to goal - would need in conjunction with statin therapy), or PCSK9i therapy (Praluent preferred over Repatha due to elevated glucose). Pt prefers to rechallenge with statin therapy. Will retry lower dose of rosuvastatin 10mg  daily. Pt sees Dr Radford Pax next week for follow up, will recheck LFTs at that time. Pt sees GI the following day, he does have a family history of fatty liver. If LFTs rise again, can consider Praluent injections if pt is willing.  Megan E. Supple, PharmD, BCACP, [place] [id number] [place], [place] [address] Phone: [phone]; Fax: [phone] 11/15/2019 11:10 AM

## 2019-11-22 ENCOUNTER — Other Ambulatory Visit: Payer: Self-pay | Admitting: Internal Medicine

## 2019-11-22 DIAGNOSIS — Z794 Long term (current) use of insulin: Secondary | ICD-10-CM

## 2019-11-22 DIAGNOSIS — E119 Type 2 diabetes mellitus without complications: Secondary | ICD-10-CM

## 2019-11-22 NOTE — Progress Notes (Signed)
Cardiology Office Note:    Date:  11/23/2019   ID:  Paul [name], DOB 03-18-1971, MRN [id number]  PCP:  Ladell Pier, MD  Cardiologist:  Fransico Him, MD    Referring MD: Ladell Pier, MD   Chief Complaint  Patient presents with  . Coronary Artery Disease  . Hyperlipidemia    History of Present Illness:    Paul [name] is a 49 y.o. male with a hx of DM, HLD, OSA on CPAP.  Recently seen in ER 07/04/2019 with complaint of chest pain that started night before at rest.  Underwent coronary CT calcium score 80 moderate atherosclerosis of the mid to distal LAD and D1, FFR showed possible flow-limiting lesions in the LAD and D1.  Cardiac catheterization: 08/26/2019. Significant single vessel disease involving the m/dLAD with PCI/DESx1 to the LAD. Plan for DAPT with ASA/plavixfor at least 6 months. Seen back by PA in March and doing well.    He is here today for followup and is doing well.  He denies any chest pain or pressure, SOB, DOE, PND, orthopnea, LE edema, dizziness, palpitations or syncope. He is compliant with his meds and is tolerating meds with no SE.    Past Medical History:  Diagnosis Date  . Diabetes mellitus without complication ([place])   . Hyperlipidemia   . Kidney stones   . Sleep apnea    wears CPAP    Past Surgical History:  Procedure Laterality Date  . COLONOSCOPY    . CORONARY STENT INTERVENTION N/A 08/26/2019   Procedure: CORONARY STENT INTERVENTION;  Surgeon: Wellington Hampshire, MD;  Location: [place] [facility];  Service: Cardiovascular;  Laterality: N/A;  . KNEE ARTHROSCOPY Right   . LEFT HEART CATH AND CORONARY ANGIOGRAPHY N/A 08/26/2019   Procedure: LEFT HEART CATH AND CORONARY ANGIOGRAPHY;  Surgeon: Wellington Hampshire, MD;  Location: [facility] [facility];  Service: Cardiovascular;  Laterality: N/A;  . UMBILICAL HERNIA REPAIR      Current Medications: Current Meds  Medication Sig  . aspirin EC 81 MG tablet Take 1 tablet (81 mg total) by  mouth daily.  . clopidogrel (PLAVIX) 75 MG tablet Take 1 tablet (75 mg total) by mouth daily.  . empagliflozin (JARDIANCE) 10 MG TABS tablet Take 10 mg by mouth daily before breakfast.  . glimepiride (AMARYL) 4 MG tablet Take 1 tablet (4 mg total) by mouth daily with breakfast.  . Homeopathic Products (THERAWORX RELIEF EX) Apply 1 application topically daily as needed (muscle cramps / pains).  . Insulin Glargine (LANTUS SOLOSTAR) 100 UNIT/ML Solostar Pen INJECT 40 UNITS UNDER THE SKIN DAILY. INCREASE IN 2 UNIT INCREMENTS EVERY 3 DAYS UNTIL MORNING BLOOD SUGAR CONSISTENTLY BETWEEN 90-130 (Patient taking differently: Inject 48 Units into the skin daily. )  . Insulin Pen Needle (B-D UF III MINI PEN NEEDLES) 31G X 5 MM MISC Use to inject insulin daily.  . Insulin Syringe-Needle U-100 (B-D INS SYRINGE 0.5CC/31GX5/16) 31G X 5/16" 0.5 ML MISC 1 each by Does not apply route at bedtime.  Marland Kitchen JANUVIA 100 MG tablet TAKE 1 TABLET BY MOUTH EVERY DAY (Patient taking differently: Take 100 mg by mouth every evening. )  . metFORMIN (GLUCOPHAGE) 1000 MG tablet TAKE 1 TABLET (1,000 MG TOTAL) BY MOUTH 2 (TWO) TIMES DAILY WITH A MEAL.  . metoprolol succinate (TOPROL XL) 25 MG 24 hr tablet Take 1 tablet (25 mg total) by mouth daily. (Patient taking differently: Take 12.5 mg by mouth daily. )  . nitroGLYCERIN (  NITROSTAT) 0.4 MG SL tablet Place 1 tablet (0.4 mg total) under the tongue every 5 (five) minutes as needed.  . rosuvastatin (CRESTOR) 10 MG tablet Take 1 tablet (10 mg total) by mouth daily.  . sildenafil (VIAGRA) 50 MG tablet Take 50 mg by mouth daily as needed for erectile dysfunction.  . Turmeric 500 MG CAPS Take 1,000 mg by mouth every evening.     Allergies:   Codeine   Social History   Socioeconomic History  . Marital status: Married    Spouse name: Not on file  . Number of children: 2  . Years of education: Not on file  . Highest education level: Not on file  Occupational History  . Occupation:  Animal nutritionist  Tobacco Use  . Smoking status: Never Smoker  . Smokeless tobacco: Never Used  Substance and Sexual Activity  . Alcohol use: Yes    Comment: Social  . Drug use: No  . Sexual activity: Yes  Other Topics Concern  . Not on file  Social History Narrative  . Not on file   Social Determinants of Health   Financial Resource Strain:   . Difficulty of Paying Living Expenses:   Food Insecurity:   . Worried About Charity fundraiser in the Last Year:   . Arboriculturist in the Last Year:   Transportation Needs:   . Film/video editor (Medical):   Marland Kitchen Lack of Transportation (Non-Medical):   Physical Activity:   . Days of Exercise per Week:   . Minutes of Exercise per Session:   Stress:   . Feeling of Stress :   Social Connections:   . Frequency of Communication with Friends and Family:   . Frequency of Social Gatherings with Friends and Family:   . Attends Religious Services:   . Active Member of Clubs or Organizations:   . Attends Archivist Meetings:   Marland Kitchen Marital Status:      Family History: The patient's family history includes Diabetes in his mother; Heart attack in his maternal grandfather and paternal grandfather; Hypertension in his father; Sleep apnea in an other family member. There is no history of Cancer, Colon cancer, Esophageal cancer, Stomach cancer, or Rectal cancer.  ROS:   Please see the history of present illness.    ROS  All other systems reviewed and negative.   EKGs/Labs/Other Studies Reviewed:    The following studies were reviewed today: none  EKG:  EKG is not ordered today.    Recent Labs: 08/24/2019: BUN 15; Creatinine, Ser 0.80; Hemoglobin 15.1; Platelets 209; Potassium 4.2; Sodium 139 11/14/2019: ALT 59   Recent Lipid Panel    Component Value Date/Time   CHOL 158 11/14/2019 0844   TRIG 94 11/14/2019 0844   HDL 39 (L) 11/14/2019 0844   CHOLHDL 4.1 11/14/2019 0844   CHOLHDL 4.9 08/12/2016 1704   VLDL 23 08/12/2016  1704   LDLCALC 101 (H) 11/14/2019 0844    Physical Exam:    VS:  BP 118/72   Pulse 64   Ht 5' 10.5" (1.791 m)   Wt 205 lb (93 kg)   SpO2 98%   BMI 29.00 kg/m     Wt Readings from Last 3 Encounters:  11/23/19 205 lb (93 kg)  10/14/19 203 lb 3.2 oz (92.2 kg)  09/08/19 207 lb (93.9 kg)     GEN:  Well nourished, well developed in no acute distress HEENT: Normal NECK: No JVD; No carotid bruits LYMPHATICS: No  lymphadenopathy CARDIAC: RRR, no murmurs, rubs, gallops RESPIRATORY:  Clear to auscultation without rales, wheezing or rhonchi  ABDOMEN: Soft, non-tender, non-distended MUSCULOSKELETAL:  No edema; No deformity  SKIN: Warm and dry NEUROLOGIC:  Alert and oriented x 3 PSYCHIATRIC:  Normal affect   ASSESSMENT:    1. Coronary artery disease involving native coronary artery of native heart without angina pectoris   2. Dyslipidemia associated with type 2 diabetes mellitus ([place])   3. DM type 2, goal HbA1c < 7% (HCC)    PLAN:    In order of problems listed above:  1.  ASCAD -coronary CT calcium score 80 moderate atherosclerosis of the mid to distal LAD and D1, FFR showed possible flow-limiting lesions in the LAD and D1. -cath 08/26/2019 showing Significant single vessel disease involving the m/dLAD with PCI/DESx1 to the LAD -he has not had any further anginal events -he will continue on DAPT with ASA 81mg  daily, Plavix 75mg  daily, Crestor and Toprol XL 12.5mg  daily   2.  HLD -LDL goal < 70 -LDL was 101 on 11/14/2019 -Zetia 10mg  recently added to Crestor 10mg  daily which he will continu -his Crestor had to be decreased from 40mg  daily to 10mg  daily due to a bump in LFTs. -he is seeing GI tomorrow about elevated LFTs>>likely fatty liver -FLP and ALT pending in June  3.  DM2 -followed by PCP -continue Januvia 100mg  daily, Metformin 1000mg  BID, glimepiride 4mg  daily and Jardiance 10mg  daily  4.  Erectile dysfunction -he is concerned about taking Viagra and then getting CP  and needing to take NTG -He knows that he needs to wait 24 hours prior to taking Viagra if he has taken an NTG -I told him that if he has taken Viagra and then gets CP to not take NTG if within 24hours and call our office for further instructions   Medication Adjustments/Labs and Tests Ordered: Current medicines are reviewed at length with the patient today.  Concerns regarding medicines are outlined above.  No orders of the defined types were placed in this encounter.  No orders of the defined types were placed in this encounter.   Signed, Fransico Him, MD  11/23/2019 8:10 AM    [place]

## 2019-11-23 ENCOUNTER — Ambulatory Visit (INDEPENDENT_AMBULATORY_CARE_PROVIDER_SITE_OTHER): Admitting: Cardiology

## 2019-11-23 ENCOUNTER — Other Ambulatory Visit: Admitting: *Deleted

## 2019-11-23 ENCOUNTER — Other Ambulatory Visit: Payer: Self-pay

## 2019-11-23 ENCOUNTER — Encounter: Payer: Self-pay | Admitting: Cardiology

## 2019-11-23 VITALS — BP 118/72 | HR 64 | Ht 70.5 in | Wt 205.0 lb

## 2019-11-23 DIAGNOSIS — I2510 Atherosclerotic heart disease of native coronary artery without angina pectoris: Secondary | ICD-10-CM | POA: Diagnosis not present

## 2019-11-23 DIAGNOSIS — E1169 Type 2 diabetes mellitus with other specified complication: Secondary | ICD-10-CM

## 2019-11-23 DIAGNOSIS — E119 Type 2 diabetes mellitus without complications: Secondary | ICD-10-CM

## 2019-11-23 DIAGNOSIS — E785 Hyperlipidemia, unspecified: Secondary | ICD-10-CM

## 2019-11-23 DIAGNOSIS — N521 Erectile dysfunction due to diseases classified elsewhere: Secondary | ICD-10-CM | POA: Diagnosis not present

## 2019-11-23 LAB — HEPATIC FUNCTION PANEL
ALT: 25 IU/L (ref 0–44)
AST: 20 IU/L (ref 0–40)
Albumin: 4.4 g/dL (ref 4.0–5.0)
Alkaline Phosphatase: 66 IU/L (ref 48–121)
Bilirubin Total: 0.7 mg/dL (ref 0.0–1.2)
Bilirubin, Direct: 0.18 mg/dL (ref 0.00–0.40)
Total Protein: 6.4 g/dL (ref 6.0–8.5)

## 2019-11-23 NOTE — Patient Instructions (Addendum)

## 2019-11-24 ENCOUNTER — Encounter: Payer: Self-pay | Admitting: Gastroenterology

## 2019-11-24 ENCOUNTER — Ambulatory Visit (INDEPENDENT_AMBULATORY_CARE_PROVIDER_SITE_OTHER): Admitting: Gastroenterology

## 2019-11-24 ENCOUNTER — Telehealth: Payer: Self-pay | Admitting: Pharmacist

## 2019-11-24 VITALS — BP 120/86 | HR 68 | Ht 69.0 in | Wt 203.2 lb

## 2019-11-24 DIAGNOSIS — K760 Fatty (change of) liver, not elsewhere classified: Secondary | ICD-10-CM | POA: Diagnosis not present

## 2019-11-24 DIAGNOSIS — I2510 Atherosclerotic heart disease of native coronary artery without angina pectoris: Secondary | ICD-10-CM

## 2019-11-24 DIAGNOSIS — R7989 Other specified abnormal findings of blood chemistry: Secondary | ICD-10-CM | POA: Diagnosis not present

## 2019-11-24 NOTE — Patient Instructions (Signed)
If you are age 51 or older, your body mass index should be between 23-30. Your Body mass index is 30.01 kg/m. If this is out of the aforementioned range listed, please consider follow up with your Primary Care Provider.  If you are age 87 or younger, your body mass index should be between 19-25. Your Body mass index is 30.01 kg/m. If this is out of the aformentioned range listed, please consider follow up with your Primary Care Provider.   Follow up as needed.

## 2019-11-24 NOTE — Telephone Encounter (Signed)
Pt seen by GI today for elevated LFTs ? due to high intensity rosuvastatin 40mg  daily.  GI note as follows: "I do not think the bump in his LFTs 2 weeks ago was due to his fatty liver.  I think that it was likely secondary to the Crestor.  I think that it is fine to keep him on lower doses of Crestor and slowly increase the dose with close monitoring of his LFTs.  If they reach 2 times upper limits of normal then would need to consider decreasing dosing versus trying another statin instead."  I had previously restarted pt on lower dose of rosuvastatin 10mg  daily. LFTs checked 1 week later were normal. Will plan to continue on current dose of rosuvastatin and recheck lipids and LFTs in 3 more weeks. If LDL is > 70 at that time, can consider increasing rosuvastatin to 20mg  daily or adding ezetimibe.  Left message for pt to discuss above plan. Will also cancel PharmD appt as pt was seen by me on 11/15/19 and just needs close lab monitoring for now.

## 2019-11-24 NOTE — Progress Notes (Signed)
11/24/2019 Paul [name] [id number] [birth year]   HISTORY OF PRESENT ILLNESS: This is a 49 year old male who is known to Dr. Ardis Hughs for previous colonoscopy.  He is here today at the request of his cardiologist, Dr. Radford Pax, for evaluation regarding elevated LFTs.  The patient tells me that he had been on pravastatin for years.  Then in February he had cardiac catheterization had a stent placed.  They then put him on high doses of Crestor.  One month ago he had a slight bump in his ALT at 57.  Then 2 weeks later, 2 weeks ago, ALT increased to 234 and AST increased to 153.  Crestor was discontinued and AST normalized and ALT decreased to 59.  Then repeat yesterday showed complete normalization with AST 28 and ALT 25.  He tells me that his Crestor has been restarted at 10 mg dosing.  Cardiology is wondering if okay to continue/restart statins.  He does have fatty liver as seen by ultrasound and CT scan in 2014, but LFTs had been completely normal until just recently.  He does admit to family history of fatty liver, but no family history of cirrhosis, liver cancer, etc.   Past Medical History:  Diagnosis Date  . Diabetes mellitus without complication ([place])   . Fatty liver    abnormal CT 2014  . Heart disease   . Hyperlipidemia   . Kidney stones   . Sleep apnea    wears CPAP   Past Surgical History:  Procedure Laterality Date  . COLONOSCOPY    . CORONARY STENT INTERVENTION N/A 08/26/2019   Procedure: CORONARY STENT INTERVENTION;  Surgeon: Wellington Hampshire, MD;  Location: [facility];  Service: Cardiovascular;  Laterality: N/A;  . KNEE ARTHROSCOPY Right   . LEFT HEART CATH AND CORONARY ANGIOGRAPHY N/A 08/26/2019   Procedure: LEFT HEART CATH AND CORONARY ANGIOGRAPHY;  Surgeon: Wellington Hampshire, MD;  Location: [facility];  Service: Cardiovascular;  Laterality: N/A;  . UMBILICAL HERNIA REPAIR      reports that he has never smoked. He has never used smokeless tobacco. He  reports current alcohol use. He reports that he does not use drugs. family history includes Diabetes in his mother; Heart attack in his maternal grandfather and paternal grandfather; Hypertension in his father; Sleep apnea in an other family member. Allergies  Allergen Reactions  . Codeine Nausea And Vomiting and Other (See Comments)    headache      Outpatient Encounter Medications as of 11/24/2019  Medication Sig  . aspirin EC 81 MG tablet Take 1 tablet (81 mg total) by mouth daily.  . B-D UF III MINI PEN NEEDLES 31G X 5 MM MISC USE TO INJECT INSULIN DAILY  . clopidogrel (PLAVIX) 75 MG tablet Take 1 tablet (75 mg total) by mouth daily.  Marland Kitchen glimepiride (AMARYL) 4 MG tablet TAKE 1 TABLET DAILY WITH BREAKFAST  . Homeopathic Products [facility] RELIEF EX) Apply 1 application topically daily as needed (muscle cramps / pains).  . Insulin Glargine (LANTUS SOLOSTAR) 100 UNIT/ML Solostar Pen INJECT 40 UNITS UNDER THE SKIN DAILY. INCREASE IN 2 UNIT INCREMENTS EVERY 3 DAYS UNTIL MORNING BLOOD SUGAR CONSISTENTLY BETWEEN 90-130 (Patient taking differently: Inject 48 Units into the skin daily. )  . Insulin Syringe-Needle U-100 (B-D INS SYRINGE 0.5CC/31GX5/16) 31G X 5/16" 0.5 ML MISC 1 each by Does not apply route at bedtime.  Marland Kitchen JANUVIA 100 MG tablet TAKE 1 TABLET DAILY  . JARDIANCE 10 MG TABS  tablet TAKE 1 TABLET DAILY BEFORE BREAKFAST  . metFORMIN (GLUCOPHAGE) 1000 MG tablet TAKE 1 TABLET TWICE A DAY WITH MEALS  . metoprolol succinate (TOPROL XL) 25 MG 24 hr tablet Take 1 tablet (25 mg total) by mouth daily. (Patient taking differently: Take 12.5 mg by mouth daily. )  . nitroGLYCERIN (NITROSTAT) 0.4 MG SL tablet Place 1 tablet (0.4 mg total) under the tongue every 5 (five) minutes as needed.  . rosuvastatin (CRESTOR) 10 MG tablet Take 1 tablet (10 mg total) by mouth daily.  . sildenafil (VIAGRA) 50 MG tablet Take 50 mg by mouth daily as needed for erectile dysfunction.  . Turmeric 500 MG CAPS Take 1,000  mg by mouth every evening.   No facility-administered encounter medications on file as of 11/24/2019.     REVIEW OF SYSTEMS  : All other systems reviewed and negative except where noted in the History of Present Illness.   PHYSICAL EXAM: BP 120/86 (BP Location: Left Arm, Patient Position: Sitting, Cuff Size: Normal)   Pulse 68   Ht 5\' 9"  (1.753 m) Comment: height measured without shoes  Wt 203 lb 4 oz (92.2 kg)   BMI 30.01 kg/m  General: Well developed white male in no acute distress Head: Normocephalic and atraumatic Eyes:  Sclerae anicteric, conjunctiva pink. Ears: Normal auditory acuity Lungs: Clear throughout to auscultation; no W/R/R. Heart: Regular rate and rhythm; no M/R/G. Abdomen: Soft, non-distended.  BS present.  Non-tender. Musculoskeletal: Symmetrical with no gross deformities  Skin: No lesions on visible extremities Extremities: No edema  Neurological: Alert oriented x 4, grossly non-focal Psychological:  Alert and cooperative. Normal mood and affect  ASSESSMENT AND PLAN: *Elevated LFTs: Patient does have fatty liver as seen by ultrasound and CT scan in 2014.  LFTs have been completely normal up until 1 month ago when he had a slight bump in ALT and then further increase a couple weeks later in both ALT and AST.  Now after discontinuing and then reinitiating Crestor at a lower dose LFTs normalized and continue to remain normal.  I do not think the bump in his LFTs 2 weeks ago was due to his fatty liver.  I think that it was likely secondary to the Crestor.  I think that it is fine to keep him on lower doses of Crestor and slowly increase the dose with close monitoring of his LFTs.  If they reach 2 times upper limits of normal then would need to consider decreasing dosing versus trying another statin instead.  No further work-up needed from a GI standpoint at this time.   CC:  Ladell Pier, MD

## 2019-11-24 NOTE — Progress Notes (Signed)
I agree with the above note, plan 

## 2019-11-25 NOTE — Telephone Encounter (Signed)
Spoke with pt, scheduled lipids and LFTs on 6/25 and canceled PharmD appt.

## 2019-12-09 ENCOUNTER — Ambulatory Visit

## 2019-12-15 ENCOUNTER — Other Ambulatory Visit: Payer: Self-pay

## 2019-12-15 MED ORDER — ROSUVASTATIN CALCIUM 10 MG PO TABS: 10.0000 mg | ORAL_TABLET | Freq: Every day | ORAL | 11 refills | Status: DC

## 2019-12-23 ENCOUNTER — Other Ambulatory Visit: Admitting: *Deleted

## 2019-12-23 ENCOUNTER — Other Ambulatory Visit: Payer: Self-pay

## 2019-12-23 DIAGNOSIS — I2510 Atherosclerotic heart disease of native coronary artery without angina pectoris: Secondary | ICD-10-CM

## 2019-12-23 LAB — HEPATIC FUNCTION PANEL
ALT: 21 IU/L (ref 0–44)
AST: 14 IU/L (ref 0–40)
Albumin: 4.3 g/dL (ref 4.0–5.0)
Alkaline Phosphatase: 67 IU/L (ref 48–121)
Bilirubin Total: 0.7 mg/dL (ref 0.0–1.2)
Bilirubin, Direct: 0.2 mg/dL (ref 0.00–0.40)
Total Protein: 6.3 g/dL (ref 6.0–8.5)

## 2019-12-23 LAB — LIPID PANEL
Chol/HDL Ratio: 3.1 ratio (ref 0.0–5.0)
Cholesterol, Total: 105 mg/dL (ref 100–199)
HDL: 34 mg/dL — ABNORMAL LOW (ref >39)
LDL Chol Calc (NIH): 59 mg/dL (ref 0–99)
Triglycerides: 50 mg/dL (ref 0–149)
VLDL Cholesterol Cal: 12 mg/dL (ref 5–40)

## 2020-02-17 ENCOUNTER — Ambulatory Visit: Attending: Internal Medicine | Admitting: Internal Medicine

## 2020-02-17 DIAGNOSIS — E1159 Type 2 diabetes mellitus with other circulatory complications: Secondary | ICD-10-CM | POA: Diagnosis not present

## 2020-02-17 DIAGNOSIS — G4733 Obstructive sleep apnea (adult) (pediatric): Secondary | ICD-10-CM | POA: Diagnosis not present

## 2020-02-17 DIAGNOSIS — E1169 Type 2 diabetes mellitus with other specified complication: Secondary | ICD-10-CM | POA: Diagnosis not present

## 2020-02-17 DIAGNOSIS — Z794 Long term (current) use of insulin: Secondary | ICD-10-CM

## 2020-02-17 DIAGNOSIS — I2510 Atherosclerotic heart disease of native coronary artery without angina pectoris: Secondary | ICD-10-CM

## 2020-02-17 DIAGNOSIS — Z23 Encounter for immunization: Secondary | ICD-10-CM

## 2020-02-17 DIAGNOSIS — E785 Hyperlipidemia, unspecified: Secondary | ICD-10-CM

## 2020-02-17 DIAGNOSIS — Z9989 Dependence on other enabling machines and devices: Secondary | ICD-10-CM

## 2020-02-17 NOTE — Progress Notes (Signed)
Virtual Visit via Telephone Note Due to current restrictions/limitations of in-office visits due to the COVID-19 pandemic, this scheduled clinical appointment was converted to a telehealth visit I connected with Paul [name] on 02/17/20 at 11:47 a.m by telephone and verified that I am speaking with the correct person using two identifiers.   I am in my office.  The patient is at home.  Only the patient, his wife and myself participated in this encounter.  I discussed the limitations, risks, security and privacy concerns of performing an evaluation and management service by telephone and the availability of in person appointments. I also discussed with the patient that there may be a patient responsible charge related to this service. The patient expressed understanding and agreed to proceed.  History of Present Illness: Hx of DM, HL,OSA on CPAP, ED (Viagra through [place]), CAD (3 vessle CAD with 80% mid-LAD s/p DES). Last eval 09/2019.  Purpose of today's visit is chronic disease management.  HM: completed Pfizer COVID-19 vaccines.  Due for flu vaccine and eye exam.  DM: He reports compliance with all of his oral medications including Metformin, glimepiride, Jardiance and Januvia.  He is taking Lantus 40 to 42 units daily. -He has not been consistent with checking blood sugars. -Tries to eat healthy but admits that sometimes he slips up and eat her she is cases or [place] fries. -Still exercising about 4 days a week but not walking as much as he would like due to his job schedule. Last A1c in April of this year was 7.  CAD/HL: His liver function tests normalize after Crestor was held.  Cardiologist reintroduce Crestor at the lower dose of 10 mg.  He seems to be tolerating this well.  His last LDL on this dose was 59 and his AST/ALT are normal.  He has not had any chest pains.  Compliant with taking aspirin and Plavix.  OSA: Reports compliance with using his CPAP machine. Outpatient Encounter  Medications as of 02/17/2020  Medication Sig  . aspirin EC 81 MG tablet Take 1 tablet (81 mg total) by mouth daily.  . B-D UF III MINI PEN NEEDLES 31G X 5 MM MISC USE TO INJECT INSULIN DAILY  . clopidogrel (PLAVIX) 75 MG tablet Take 1 tablet (75 mg total) by mouth daily.  Marland Kitchen glimepiride (AMARYL) 4 MG tablet TAKE 1 TABLET DAILY WITH BREAKFAST  . Homeopathic Products [facility] RELIEF EX) Apply 1 application topically daily as needed (muscle cramps / pains).  . Insulin Glargine (LANTUS SOLOSTAR) 100 UNIT/ML Solostar Pen INJECT 40 UNITS UNDER THE SKIN DAILY. INCREASE IN 2 UNIT INCREMENTS EVERY 3 DAYS UNTIL MORNING BLOOD SUGAR CONSISTENTLY BETWEEN 90-130 (Patient taking differently: Inject 48 Units into the skin daily. )  . Insulin Syringe-Needle U-100 (B-D INS SYRINGE 0.5CC/31GX5/16) 31G X 5/16" 0.5 ML MISC 1 each by Does not apply route at bedtime.  Marland Kitchen JANUVIA 100 MG tablet TAKE 1 TABLET DAILY  . JARDIANCE 10 MG TABS tablet TAKE 1 TABLET DAILY BEFORE BREAKFAST  . metFORMIN (GLUCOPHAGE) 1000 MG tablet TAKE 1 TABLET TWICE A DAY WITH MEALS  . metoprolol succinate (TOPROL XL) 25 MG 24 hr tablet Take 1 tablet (25 mg total) by mouth daily. (Patient taking differently: Take 12.5 mg by mouth daily. )  . nitroGLYCERIN (NITROSTAT) 0.4 MG SL tablet Place 1 tablet (0.4 mg total) under the tongue every 5 (five) minutes as needed.  . rosuvastatin (CRESTOR) 10 MG tablet Take 1 tablet (10 mg total) by mouth daily.  Marland Kitchen  sildenafil (VIAGRA) 50 MG tablet Take 50 mg by mouth daily as needed for erectile dysfunction.  . Turmeric 500 MG CAPS Take 1,000 mg by mouth every evening.   No facility-administered encounter medications on file as of 02/17/2020.      Observations/Objective: Results for orders placed or performed in visit on 12/23/19  Lipid panel  Result Value Ref Range   Cholesterol, Total 105 100 - 199 mg/dL   Triglycerides 50 0 - 149 mg/dL   HDL 34 (L) >39 mg/dL   VLDL Cholesterol Cal 12 5 - 40 mg/dL   LDL  Chol Calc (NIH) 59 0 - 99 mg/dL   Chol/HDL Ratio 3.1 0.0 - 5.0 ratio  Hepatic function panel  Result Value Ref Range   Total Protein 6.3 6.0 - 8.5 g/dL   Albumin 4.3 4.0 - 5.0 g/dL   Bilirubin Total 0.7 0.0 - 1.2 mg/dL   Bilirubin, Direct 0.20 0.00 - 0.40 mg/dL   Alkaline Phosphatase 67 48 - 121 IU/L   AST 14 0 - 40 IU/L   ALT 21 0 - 44 IU/L     Assessment and Plan: 1. Type 2 diabetes mellitus with other circulatory complication, with long-term current use of insulin (HCC) Level of control unknown at this time as patient has not been checking his blood sugars.  Encouraged him to do so.  He will come to the lab to have an A1c and his urine microalbumin done.  Encouraged him to continue trying to eat healthy.  Continue regular exercise.  No changes made in the dose of Lantus insulin and his oral medications. - Hemoglobin A1c; Future - Microalbumin / creatinine urine ratio  2. Coronary artery disease involving native coronary artery of native heart without angina pectoris Stable.  Continue Lipitor, metoprolol, aspirin and Plavix.  3. OSA on CPAP Encouraged to continue compliance with CPAP  4. Dyslipidemia associated with type 2 diabetes mellitus (HCC) Continue Crestor.  He is tolerating the 10 mg dose without recurrence of elevation in AST/ALT  5. Need for influenza vaccination When patient comes to the lab he will see our clinical pharmacist to get his flu vaccine.   Follow Up Instructions: 4 months in person.   I discussed the assessment and treatment plan with the patient. The patient was provided an opportunity to ask questions and all were answered. The patient agreed with the plan and demonstrated an understanding of the instructions.   The patient was advised to call back or seek an in-person evaluation if the symptoms worsen or if the condition fails to improve as anticipated.  I provided 12 minutes of non-face-to-face time during this encounter.   Karle Plumber,  MD

## 2020-03-02 ENCOUNTER — Ambulatory Visit: Attending: Family Medicine | Admitting: Pharmacist

## 2020-03-02 ENCOUNTER — Encounter: Payer: Self-pay | Admitting: Pharmacist

## 2020-03-02 ENCOUNTER — Other Ambulatory Visit: Payer: Self-pay

## 2020-03-02 DIAGNOSIS — Z23 Encounter for immunization: Secondary | ICD-10-CM

## 2020-03-02 DIAGNOSIS — Z794 Long term (current) use of insulin: Secondary | ICD-10-CM

## 2020-03-02 DIAGNOSIS — E1159 Type 2 diabetes mellitus with other circulatory complications: Secondary | ICD-10-CM

## 2020-03-02 NOTE — Progress Notes (Signed)
Patient presents for vaccination against influenza per orders of Dr. Wickard. Consent given. Counseling provided. No contraindications exists. Vaccine administered without incident.  ° °[name] Van Ausdall, PharmD, CPP °Clinical Pharmacist °[facility] °[phone] ° °

## 2020-03-03 LAB — MICROALBUMIN / CREATININE URINE RATIO
Creatinine, Urine: 71.5 mg/dL
Microalb/Creat Ratio: <4 mg/g creat (ref 0–29)
Microalbumin, Urine: <3 ug/mL

## 2020-03-03 LAB — HEMOGLOBIN A1C
Est. average glucose Bld gHb Est-mCnc: 189 mg/dL
Hgb A1c MFr Bld: 8.2 % — ABNORMAL HIGH (ref 4.8–5.6)

## 2020-06-19 ENCOUNTER — Ambulatory Visit: Attending: Internal Medicine | Admitting: Internal Medicine

## 2020-06-19 ENCOUNTER — Encounter: Payer: Self-pay | Admitting: Internal Medicine

## 2020-06-19 ENCOUNTER — Other Ambulatory Visit: Payer: Self-pay

## 2020-06-19 VITALS — BP 113/69 | HR 74 | Temp 98.2°F | Resp 16 | Wt 210.4 lb

## 2020-06-19 DIAGNOSIS — E1159 Type 2 diabetes mellitus with other circulatory complications: Secondary | ICD-10-CM | POA: Diagnosis not present

## 2020-06-19 DIAGNOSIS — E785 Hyperlipidemia, unspecified: Secondary | ICD-10-CM

## 2020-06-19 DIAGNOSIS — Z794 Long term (current) use of insulin: Secondary | ICD-10-CM

## 2020-06-19 DIAGNOSIS — M791 Myalgia, unspecified site: Secondary | ICD-10-CM

## 2020-06-19 DIAGNOSIS — I2510 Atherosclerotic heart disease of native coronary artery without angina pectoris: Secondary | ICD-10-CM

## 2020-06-19 DIAGNOSIS — E1169 Type 2 diabetes mellitus with other specified complication: Secondary | ICD-10-CM | POA: Diagnosis not present

## 2020-06-19 DIAGNOSIS — M2550 Pain in unspecified joint: Secondary | ICD-10-CM

## 2020-06-19 LAB — POCT GLYCOSYLATED HEMOGLOBIN (HGB A1C): HbA1c, POC (controlled diabetic range): 8.8 % — AB (ref 0.0–7.0)

## 2020-06-19 LAB — GLUCOSE, POCT (MANUAL RESULT ENTRY): POC Glucose: 350 mg/dl — AB (ref 70–99)

## 2020-06-19 NOTE — Progress Notes (Signed)
[name], male    DOB: 12-Sep-1970  MRN: [id number]  CC: Diabetes   Subjective: Paul [name] is a 49 y.o. male who presents for chronic ds management His concerns today include:  Hx of DM, HL,OSA on CPAP, ED (Viagra through [place]), CAD (3 vessle CAD with 80% mid-LAD s/p DES).  DIABETES TYPE 2 Last A1C:   Results for orders placed or performed in visit on 06/19/20  POCT glucose (manual entry)  Result Value Ref Range   POC Glucose 350 (A) 70 - 99 mg/dl  POCT glycosylated hemoglobin (Hb A1C)  Result Value Ref Range   Hemoglobin A1C     HbA1c POC (<> result, manual entry)     HbA1c, POC (prediabetic range)     HbA1c, POC (controlled diabetic range) 8.8 (A) 0.0 - 7.0 %    Med Adherence:  [x]  Yes -on Lantus 44 units and the 4 oral meds Medication side effects:  []  Yes    [x]  No Home Monitoring?  [x]  Yes  But not consistently Home glucose results range: Diet Adherence: []  Yes    [x]  No admits eating habits not good during holidays Exercise: [x]  Yes - not walking  as often because working a lot of hrs. But does wgh training 3-4 times a wk for 1 hr at the gym Hypoglycemic episodes?: [x]  Yes  -occasionally Numbness of the feet? []  Yes    [x]  No Retinopathy hx? []  Yes    [x]  No Last eye exam: due for eye exam.  States he has eye doctor.  Will send Mychart message with name for me  to submit referral Comments:    Patient complains of soreness in his wrists elbows and shoulders and in his forearm muscles for a few hours after weightlifting at the gym.  Wakes up a little sore in these areas the following day but then it goes away.  He wonders whether it is stage or may have been related to the COVID-19 vaccine.  He started noticing it early in the year but not sure whether it was related to him getting the vaccines.    CAD/HL: no CP/SOB/LE edema No bruising or bleeding on aspirin and Plavix   Patient Active Problem List   Diagnosis Date Noted   Fatty liver  11/24/2019   Elevated LFTs 11/15/2019   CAD (coronary artery disease) 09/08/2019   Angina pectoris ([place])    Erectile dysfunction 08/12/2016   Polyp of colon 08/12/2016   Obstructive sleep apnea 04/03/2015   Onychomycosis of right great toe 04/03/2015   Diabetes type 2, uncontrolled ([place]) 01/25/2014   Dyslipidemia associated with type 2 diabetes mellitus ([place]) 01/25/2014   Skin tag 01/25/2014     Current Outpatient Medications on File Prior to Visit  Medication Sig Dispense Refill   aspirin EC 81 MG tablet Take 1 tablet (81 mg total) by mouth daily. 90 tablet 3   B-D UF III MINI PEN NEEDLES 31G X 5 MM MISC USE TO INJECT INSULIN DAILY 100 each 3   clopidogrel (PLAVIX) 75 MG tablet Take 1 tablet (75 mg total) by mouth daily. 90 tablet 3   glimepiride (AMARYL) 4 MG tablet TAKE 1 TABLET DAILY WITH BREAKFAST 90 tablet 3   Homeopathic Products (THERAWORX RELIEF EX) Apply 1 application topically daily as needed (muscle cramps / pains).     Insulin Glargine (LANTUS SOLOSTAR) 100 UNIT/ML Solostar Pen INJECT 40 UNITS UNDER THE SKIN DAILY. INCREASE IN 2 UNIT INCREMENTS EVERY 3  DAYS UNTIL MORNING BLOOD SUGAR CONSISTENTLY BETWEEN 90-130 (Patient taking differently: Inject 48 Units into the skin daily. ) 15 mL 8   Insulin Syringe-Needle U-100 (B-D INS SYRINGE 0.5CC/31GX5/16) 31G X 5/16" 0.5 ML MISC 1 each by Does not apply route at bedtime. 30 each 2   JANUVIA 100 MG tablet TAKE 1 TABLET DAILY 90 tablet 3   JARDIANCE 10 MG TABS tablet TAKE 1 TABLET DAILY BEFORE BREAKFAST 30 tablet 11   metFORMIN (GLUCOPHAGE) 1000 MG tablet TAKE 1 TABLET TWICE A DAY WITH MEALS 180 tablet 3   metoprolol succinate (TOPROL XL) 25 MG 24 hr tablet Take 1 tablet (25 mg total) by mouth daily. (Patient taking differently: Take 12.5 mg by mouth daily. ) 90 tablet 3   nitroGLYCERIN (NITROSTAT) 0.4 MG SL tablet Place 1 tablet (0.4 mg total) under the tongue every 5 (five) minutes as needed. 25 tablet 2    rosuvastatin (CRESTOR) 10 MG tablet Take 1 tablet (10 mg total) by mouth daily. 30 tablet 11   sildenafil (VIAGRA) 50 MG tablet Take 50 mg by mouth daily as needed for erectile dysfunction.     Turmeric 500 MG CAPS Take 1,000 mg by mouth every evening.     No current facility-administered medications on file prior to visit.    Allergies  Allergen Reactions   Codeine Nausea And Vomiting and Other (See Comments)    headache    Social History   Socioeconomic History   Marital status: Married    Spouse name: Not on file   Number of children: 2   Years of education: Not on file   Highest education level: Not on file  Occupational History   Occupation: Animal nutritionist  Tobacco Use   Smoking status: Never Smoker   Smokeless tobacco: Never Used  Scientific laboratory technician Use: Never used  Substance and Sexual Activity   Alcohol use: Yes    Comment: Social   Drug use: No   Sexual activity: Yes  Other Topics Concern   Not on file  Social History Narrative   Not on file   Social Determinants of Health   Financial Resource Strain: Not on file  Food Insecurity: Not on file  Transportation Needs: Not on file  Physical Activity: Not on file  Stress: Not on file  Social Connections: Not on file  Intimate Partner Violence: Not on file    Family History  Problem Relation Age of Onset   Hypertension Father    Diabetes Mother    Sleep apnea Other    Heart attack Maternal Grandfather    Heart attack Paternal Grandfather    Cancer Neg Hx    Colon cancer Neg Hx    Esophageal cancer Neg Hx    Stomach cancer Neg Hx    Rectal cancer Neg Hx     Past Surgical History:  Procedure Laterality Date   COLONOSCOPY     CORONARY STENT INTERVENTION N/A 08/26/2019   Procedure: CORONARY STENT INTERVENTION;  Surgeon: Wellington Hampshire, MD;  Location: [facility];  Service: Cardiovascular;  Laterality: N/A;   KNEE ARTHROSCOPY Right    LEFT HEART CATH AND  CORONARY ANGIOGRAPHY N/A 08/26/2019   Procedure: LEFT HEART CATH AND CORONARY ANGIOGRAPHY;  Surgeon: Wellington Hampshire, MD;  Location: [facility];  Service: Cardiovascular;  Laterality: N/A;   UMBILICAL HERNIA REPAIR      ROS: Review of Systems Negative except as stated above  PHYSICAL EXAM: BP 113/69  Pulse 74    Temp 98.2 F (36.8 C)    Resp 16    Wt 210 lb 6.4 oz (95.4 kg)    SpO2 96%    BMI 31.07 kg/m   Wt Readings from Last 3 Encounters:  06/19/20 210 lb 6.4 oz (95.4 kg)  11/24/19 203 lb 4 oz (92.2 kg)  11/23/19 205 lb (93 kg)    Physical Exam   General appearance - alert, well appearing, and in no distress Mental status - normal mood, behavior, speech, dress, motor activity, and thought processes Neck - supple, no significant adenopathy Chest - clear to auscultation, no wheezes, rales or rhonchi, symmetric air entry Heart - normal rate, regular rhythm, normal S1, S2, no murmurs, rubs, clicks or gallops Extremities - peripheral pulses normal, no pedal edema, no clubbing or cyanosis MSK: No edema or erythema noted of the wrists, elbows.  No tenderness on palpation of the forearm muscles. Diabetic Foot Exam - Simple   Simple Foot Form Visual Inspection See comments: Yes Sensation Testing Intact to touch and monofilament testing bilaterally: Yes Pulse Check Posterior Tibialis and Dorsalis pulse intact bilaterally: Yes Comments Toenail on right big toe is thickened discolored.     CMP Latest Ref Rng & Units 12/23/2019 11/23/2019 11/14/2019  Glucose 65 - 99 mg/dL - - -  BUN 6 - 24 mg/dL - - -  Creatinine 0.76 - 1.27 mg/dL - - -  Sodium 134 - 144 mmol/L - - -  Potassium 3.5 - 5.2 mmol/L - - -  Chloride 96 - 106 mmol/L - - -  CO2 20 - 29 mmol/L - - -  Calcium 8.7 - 10.2 mg/dL - - -  Total Protein 6.0 - 8.5 g/dL 6.3 6.4 6.1  Total Bilirubin 0.0 - 1.2 mg/dL 0.7 0.7 0.5  Alkaline Phos 48 - 121 IU/L 67 66 66  AST 0 - 40 IU/L 14 20 20   ALT 0 - 44 IU/L 21 25  59(H)   Lipid Panel     Component Value Date/Time   CHOL 105 12/23/2019 0906   TRIG 50 12/23/2019 0906   HDL 34 (L) 12/23/2019 0906   CHOLHDL 3.1 12/23/2019 0906   CHOLHDL 4.9 08/12/2016 1704   VLDL 23 08/12/2016 1704   LDLCALC 59 12/23/2019 0906    CBC    Component Value Date/Time   WBC 7.9 08/24/2019 1324   WBC 10.3 07/04/2019 0251   RBC 4.93 08/24/2019 1324   RBC 4.89 07/04/2019 0251   HGB 15.1 08/24/2019 1324   HCT 43.2 08/24/2019 1324   PLT 209 08/24/2019 1324   MCV 88 08/24/2019 1324   MCH 30.6 08/24/2019 1324   MCH 30.7 07/04/2019 0251   MCHC 35.0 08/24/2019 1324   MCHC 36.3 (H) 07/04/2019 0251   RDW 12.3 08/24/2019 1324   LYMPHSABS 2.3 01/25/2014 1541   MONOABS 0.6 01/25/2014 1541   EOSABS 0.5 01/25/2014 1541   BASOSABS 0.1 01/25/2014 1541    ASSESSMENT AND PLAN: 1. Type 2 diabetes mellitus with other circulatory complication, with long-term current use of insulin (HCC) Not at goal. Discussed importance of good diabetes control to prevent complications. Healthy eating habits encouraged. Discussed stopping Januvia and Jardiance or Amaryl, continuing Metformin and adding Trulicity.  I went over with him how Trulicity works and some of the side effects including nausea, vomiting and complications that can occur including drug-induced pancreatitis.  Patient states he will think about this and talk it over with his wife first and will  let me know via MyChart.  In the meantime I have told him to please check his blood sugars every morning and increase the Lantus by 2 units every 2 to 3 days until his morning blood sugars are consistently less than 130 -Recommend breaking up his gym time in to 30 minutes cardio 30 minutes weight training -He will send me a MyChart message with the name of the ophthalmologist to submit his referral to for eye exam. - POCT glucose (manual entry) - POCT glycosylated hemoglobin (Hb A1C) - Comprehensive metabolic panel - CBC  2. Coronary  artery disease involving native coronary artery of native heart without angina pectoris Stable on current medications including Crestor, Plavix, aspirin and metoprolol  3. Dyslipidemia associated with type 2 diabetes mellitus (HCC) - Comprehensive metabolic panel  4. Arthralgia, unspecified joint 5. Myalgia Symptoms may be related to statin therapy or just muscle overuse from heavy weight training.  I recommend that if he has symptoms that persist or gets worse he should definitely let us know.  Recommend that he decrease the weights that he use in his weight training    Patient was given the opportunity to ask questions.  Patient verbalized understanding of the plan and was able to repeat key elements of the plan.   Orders Placed This Encounter  Procedures   POCT glucose (manual entry)   POCT glycosylated hemoglobin (Hb A1C)     Requested Prescriptions    No prescriptions requested or ordered in this encounter    No follow-ups on file.  Karle Plumber, MD, FACP

## 2020-06-19 NOTE — Patient Instructions (Addendum)
Your A1c is 8.8 meaning your diabetes is not controlled. Please check your blood sugar every morning before breakfast.  Increase the Lantus insulin by 2 units every 2 to 3 days until your morning blood sugars are consistently less than 130.  Please let me know if you change your mind about trying the Trulicity.  If you agree to trying the Trulicity, and provided it is covered by your insurance, we can stop the [place].  Please send me the name of your ophthalmologist/optometrist so that I can submit that referral.  Try decreasing the size of your weights that you use during weight training.  When you go to the gym, you should try breaking up your sessions and to 30 minutes of cardio and 30 minutes of weight training.  If you experience any chest pains or dizziness during workouts you should stop workouts and let us know.

## 2020-06-20 LAB — COMPREHENSIVE METABOLIC PANEL
ALT: 21 IU/L (ref 0–44)
AST: 14 IU/L (ref 0–40)
Albumin/Globulin Ratio: 2.1 (ref 1.2–2.2)
Albumin: 4.4 g/dL (ref 3.8–4.9)
Alkaline Phosphatase: 68 IU/L (ref 44–121)
BUN/Creatinine Ratio: 19 (ref 9–20)
BUN: 15 mg/dL (ref 6–24)
Bilirubin Total: 0.8 mg/dL (ref 0.0–1.2)
CO2: 24 mmol/L (ref 20–29)
Calcium: 9.2 mg/dL (ref 8.7–10.2)
Chloride: 103 mmol/L (ref 96–106)
Creatinine, Ser: 0.81 mg/dL (ref 0.76–1.27)
GFR calc Af Amer: 119 mL/min/{1.73_m2} (ref >59)
GFR calc non Af Amer: 103 mL/min/{1.73_m2} (ref >59)
Globulin, Total: 2.1 g/dL (ref 1.5–4.5)
Glucose: 281 mg/dL — ABNORMAL HIGH (ref 65–99)
Potassium: 4.2 mmol/L (ref 3.5–5.2)
Sodium: 143 mmol/L (ref 134–144)
Total Protein: 6.5 g/dL (ref 6.0–8.5)

## 2020-06-20 LAB — CBC
Hematocrit: 42.7 % (ref 37.5–51.0)
Hemoglobin: 14.8 g/dL (ref 13.0–17.7)
MCH: 30.4 pg (ref 26.6–33.0)
MCHC: 34.7 g/dL (ref 31.5–35.7)
MCV: 88 fL (ref 79–97)
Platelets: 183 10*3/uL (ref 150–450)
RBC: 4.87 x10E6/uL (ref 4.14–5.80)
RDW: 12.3 % (ref 11.6–15.4)
WBC: 7.7 10*3/uL (ref 3.4–10.8)

## 2020-07-05 ENCOUNTER — Other Ambulatory Visit: Payer: Self-pay

## 2020-07-05 ENCOUNTER — Other Ambulatory Visit: Payer: Self-pay | Admitting: Internal Medicine

## 2020-07-05 ENCOUNTER — Ambulatory Visit: Admitting: Cardiology

## 2020-07-05 VITALS — BP 126/64 | HR 79 | Ht 70.0 in | Wt 208.6 lb

## 2020-07-05 DIAGNOSIS — E119 Type 2 diabetes mellitus without complications: Secondary | ICD-10-CM | POA: Diagnosis not present

## 2020-07-05 DIAGNOSIS — E1169 Type 2 diabetes mellitus with other specified complication: Secondary | ICD-10-CM

## 2020-07-05 DIAGNOSIS — I2510 Atherosclerotic heart disease of native coronary artery without angina pectoris: Secondary | ICD-10-CM

## 2020-07-05 DIAGNOSIS — E785 Hyperlipidemia, unspecified: Secondary | ICD-10-CM | POA: Diagnosis not present

## 2020-07-05 DIAGNOSIS — Z794 Long term (current) use of insulin: Secondary | ICD-10-CM

## 2020-07-05 NOTE — Addendum Note (Signed)
Addended by: Theresia Majors on: 07/05/2020 02:00 PM   Modules accepted: Orders

## 2020-07-05 NOTE — Progress Notes (Signed)
Cardiology Office Note:    Date:  07/05/2020   ID:  Paul [name], DOB 10-Mar-1971, MRN [id number]  PCP:  Ladell Pier, MD  Cardiologist:  Fransico Him, MD    Referring MD: Ladell Pier, MD   Chief Complaint  Patient presents with  . Coronary Artery Disease  . Hypertension  . Hyperlipidemia    History of Present Illness:    Paul [name] is a 50 y.o. male with a hx of DM, HLD, OSA on CPAP.  Recently seen in ER 07/04/2019 with complaint of chest pain that started night before at rest.  Underwent coronary CT calcium score 80 moderate atherosclerosis of the mid to distal LAD and D1, FFR showed possible flow-limiting lesions in the LAD and D1.  Cardiac catheterization: 08/26/2019. Significant single vessel disease involving the m/dLAD with PCI/DESx1 to the LAD. Plan for DAPT with ASA/plavix. He had elevated LFTs on Crestor 40mg  daily and this was dropped to 10mg  daily along with addition of Zetia 10mg  daily with resolution of elevated LFTs.  He is here today for followup and is doing well.  He denies any chest pain or pressure, SOB, DOE, PND, orthopnea, LE edema, dizziness, palpitations or syncope. He has a new job over the past year and has not had as much time as he used to. He is compliant with his meds and is tolerating meds with no SE.    Past Medical History:  Diagnosis Date  . Diabetes mellitus without complication ([place])   . Fatty liver    abnormal CT 2014  . Heart disease   . Hyperlipidemia   . Kidney stones   . Sleep apnea    wears CPAP    Past Surgical History:  Procedure Laterality Date  . COLONOSCOPY    . CORONARY STENT INTERVENTION N/A 08/26/2019   Procedure: CORONARY STENT INTERVENTION;  Surgeon: Wellington Hampshire, MD;  Location: [facility];  Service: Cardiovascular;  Laterality: N/A;  . KNEE ARTHROSCOPY Right   . LEFT HEART CATH AND CORONARY ANGIOGRAPHY N/A 08/26/2019   Procedure: LEFT HEART CATH AND CORONARY ANGIOGRAPHY;  Surgeon: Wellington Hampshire, MD;  Location: [facility];  Service: Cardiovascular;  Laterality: N/A;  . UMBILICAL HERNIA REPAIR      Current Medications: Current Meds  Medication Sig  . aspirin EC 81 MG tablet Take 1 tablet (81 mg total) by mouth daily.  . B-D UF III MINI PEN NEEDLES 31G X 5 MM MISC USE TO INJECT INSULIN DAILY  . clopidogrel (PLAVIX) 75 MG tablet Take 1 tablet (75 mg total) by mouth daily.  Marland Kitchen glimepiride (AMARYL) 4 MG tablet TAKE 1 TABLET DAILY WITH BREAKFAST  . Homeopathic Products [facility] RELIEF EX) Apply 1 application topically daily as needed (muscle cramps / pains).  . Insulin Glargine (LANTUS SOLOSTAR) 100 UNIT/ML Solostar Pen INJECT 40 UNITS UNDER THE SKIN DAILY. INCREASE IN 2 UNIT INCREMENTS EVERY 3 DAYS UNTIL MORNING BLOOD SUGAR CONSISTENTLY BETWEEN 90-130 (Patient taking differently: Inject 48 Units into the skin daily.)  . Insulin Syringe-Needle U-100 (B-D INS SYRINGE 0.5CC/31GX5/16) 31G X 5/16" 0.5 ML MISC 1 each by Does not apply route at bedtime.  Marland Kitchen JANUVIA 100 MG tablet TAKE 1 TABLET DAILY  . JARDIANCE 10 MG TABS tablet TAKE 1 TABLET DAILY BEFORE BREAKFAST  . metFORMIN (GLUCOPHAGE) 1000 MG tablet TAKE 1 TABLET TWICE A DAY WITH MEALS  . metoprolol succinate (TOPROL XL) 25 MG 24 hr tablet Take 1 tablet (25 mg  total) by mouth daily. (Patient taking differently: Take 12.5 mg by mouth daily.)  . nitroGLYCERIN (NITROSTAT) 0.4 MG SL tablet Place 1 tablet (0.4 mg total) under the tongue every 5 (five) minutes as needed.  . rosuvastatin (CRESTOR) 10 MG tablet Take 1 tablet (10 mg total) by mouth daily.  . sildenafil (VIAGRA) 50 MG tablet Take 50 mg by mouth daily as needed for erectile dysfunction.  . Turmeric 500 MG CAPS Take 1,000 mg by mouth every evening.     Allergies:   Codeine and Crestor [rosuvastatin]   Social History   Socioeconomic History  . Marital status: Married    Spouse name: Not on file  . Number of children: 2  . Years of education: Not on file  .  Highest education level: Not on file  Occupational History  . Occupation: Engineer, materials  Tobacco Use  . Smoking status: Never Smoker  . Smokeless tobacco: Never Used  Vaping Use  . Vaping Use: Never used  Substance and Sexual Activity  . Alcohol use: Yes    Comment: Social  . Drug use: No  . Sexual activity: Yes  Other Topics Concern  . Not on file  Social History Narrative  . Not on file   Social Determinants of Health   Financial Resource Strain: Not on file  Food Insecurity: Not on file  Transportation Needs: Not on file  Physical Activity: Not on file  Stress: Not on file  Social Connections: Not on file     Family History: The patient's family history includes Diabetes in his mother; Heart attack in his maternal grandfather and paternal grandfather; Hypertension in his father; Sleep apnea in an other family member. There is no history of Cancer, Colon cancer, Esophageal cancer, Stomach cancer, or Rectal cancer.  ROS:   Please see the history of present illness.    ROS  All other systems reviewed and negative.   EKGs/Labs/Other Studies Reviewed:    The following studies were reviewed today: none  EKG:  EKG is not ordered today.    Recent Labs: 06/19/2020: ALT 21; BUN 15; Creatinine, Ser 0.81; Hemoglobin 14.8; Platelets 183; Potassium 4.2; Sodium 143   Recent Lipid Panel    Component Value Date/Time   CHOL 105 12/23/2019 0906   TRIG 50 12/23/2019 0906   HDL 34 (L) 12/23/2019 0906   CHOLHDL 3.1 12/23/2019 0906   CHOLHDL 4.9 08/12/2016 1704   VLDL 23 08/12/2016 1704   LDLCALC 59 12/23/2019 0906    Physical Exam:    VS:  BP 126/64   Pulse 79   Ht 5\' 10"  (1.778 m)   Wt 208 lb 9.6 oz (94.6 kg)   SpO2 98%   BMI 29.93 kg/m     Wt Readings from Last 3 Encounters:  07/05/20 208 lb 9.6 oz (94.6 kg)  06/19/20 210 lb 6.4 oz (95.4 kg)  11/24/19 203 lb 4 oz (92.2 kg)     GEN: Well nourished, well developed in no acute distress HEENT: Normal NECK: No  JVD; No carotid bruits LYMPHATICS: No lymphadenopathy CARDIAC:RRR, no murmurs, rubs, gallops RESPIRATORY:  Clear to auscultation without rales, wheezing or rhonchi  ABDOMEN: Soft, non-tender, non-distended MUSCULOSKELETAL:  No edema; No deformity  SKIN: Warm and dry NEUROLOGIC:  Alert and oriented x 3 PSYCHIATRIC:  Normal affect    ASSESSMENT:    1. Coronary artery disease involving native coronary artery of native heart without angina pectoris   2. Dyslipidemia associated with type 2 diabetes mellitus (HCC)  3. DM type 2, goal HbA1c < 7% (HCC)    PLAN:    In order of problems listed above:  1.  ASCAD -coronary CT calcium score 80 moderate atherosclerosis of the mid to distal LAD and D1, FFR showed possible flow-limiting lesions in the LAD and D1. -cath 08/26/2019 showing Significant single vessel disease involving the m/dLAD with PCI/DESx1 to the LAD -he denies any anginal symptoms since I saw him last -he will continue on DAPT with ASA 81mg  daily, Plavix 75mg  daily, Crestor and Toprol XL 12.5mg  daily   2.  HLD -LDL goal < 70 -LDL was 59 in June 2021 -Continue Zetia 10mg  and Crestor 10mg  daily  -he had elevated LFTs with higher doses of Crestor -repeat FLP and ALT in March  3.  DM2 -followed by PCP -continue Januvia 100mg  daily, Metformin 1000mg  BID, glimepiride 4mg  daily and Jardiance 10mg  daily  Medication Adjustments/Labs and Tests Ordered: Current medicines are reviewed at length with the patient today.  Concerns regarding medicines are outlined above.  No orders of the defined types were placed in this encounter.  No orders of the defined types were placed in this encounter.   Signed, Fransico Him, MD  07/05/2020 1:53 PM    [place]

## 2020-07-05 NOTE — Patient Instructions (Signed)
Medication Instructions:  Your physician recommends that you continue on your current medications as directed. Please refer to the Current Medication list given to you today. *If you need a refill on your cardiac medications before your next appointment, please call your pharmacy*   Lab Work: Fasting lipids and ALT in March 2022 If you have labs (blood work) drawn today and your tests are completely normal, you will receive your results only by: Marland Kitchen MyChart Message (if you have MyChart) OR . A paper copy in the mail If you have any lab test that is abnormal or we need to change your treatment, we will call you to review the results.  Follow-Up: At [facility], you and your health needs are our priority.  As part of our continuing mission to provide you with exceptional heart care, we have created designated Provider Care Teams.  These Care Teams include your primary Cardiologist (physician) and Advanced Practice Providers (APPs -  Physician Assistants and Nurse Practitioners) who all work together to provide you with the care you need, when you need it.   Your next appointment:   1 year(s)  The format for your next appointment:   In Person  Provider:   You may see Armanda Magic, MD or one of the following Advanced Practice Providers on your designated Care Team:    Ronie Spies, PA-C  Jacolyn Reedy, PA-C

## 2020-07-05 NOTE — Telephone Encounter (Signed)
Requested medication (s) are due for refill today: Yes  Requested medication (s) are on the active medication list: Yes  Last refill:  08/22/19  Future visit scheduled: Yes  Notes to clinic:  Unable to refill per protocol, variable instructions, will need to indicate 48 units in SIG as that is what patient reports taking per medication profile     Requested Prescriptions  Pending Prescriptions Disp Refills   LANTUS SOLOSTAR 100 UNIT/ML Solostar Pen [Pharmacy Med Name: LANTUS SOLOSTAR PEN 100U/ML] 15 mL 8    Sig: INJECT 40 UNITS UNDER THE SKIN DAILY. INCREASE IN 2 UNIT INCREMENTS EVERY 3 DAYS UNTIL MORNING BLOOD SUGAR CONSISTENTLY BETWEEN 90-130      Endocrinology:  Diabetes - Insulins Failed - 07/05/2020  1:27 AM      Failed - HBA1C is between 0 and 7.9 and within 180 days    HbA1c, POC (controlled diabetic range)  Date Value Ref Range Status  06/19/2020 8.8 (A) 0.0 - 7.0 % Final          Passed - Valid encounter within last 6 months    Recent Outpatient Visits           2 weeks ago Type 2 diabetes mellitus with other circulatory complication, with long-term current use of insulin (HCC)   [facility] Jonah Blue B, MD   4 months ago Need for influenza vaccination   [facility] [place], Jeannett Senior L, RPH-CPP   4 months ago Type 2 diabetes mellitus with other circulatory complication, with long-term current use of insulin [facility])   [facility] [place], Gavin Pound B, MD   8 months ago Type 2 diabetes mellitus with other circulatory complication, with long-term current use of insulin [facility])   [facility] Jonah Blue B, MD   10 months ago Type 2 diabetes mellitus without complication, with long-term current use of insulin [facility])    [facility] [place]ux[place], Cornelius Moras, RPH-CPP       Future Appointments             In 2 months  [place] Benes, Binnie Rail, MD [facility]

## 2020-07-24 ENCOUNTER — Telehealth (INDEPENDENT_AMBULATORY_CARE_PROVIDER_SITE_OTHER): Admitting: Physician Assistant

## 2020-07-24 ENCOUNTER — Encounter: Payer: Self-pay | Admitting: Physician Assistant

## 2020-07-24 ENCOUNTER — Other Ambulatory Visit: Payer: Self-pay

## 2020-07-24 DIAGNOSIS — J00 Acute nasopharyngitis [common cold]: Secondary | ICD-10-CM | POA: Diagnosis not present

## 2020-07-24 NOTE — Progress Notes (Signed)
Patient verified DOB Patient has eaten today and patient has taken sudafed with minimal relief. Patient complains of itchy throat with runny nose beginning Sunday afternoon. Patient reports clear drainage. Patient denies N/V/diarrhea. Patient denies HA or body aches.  Patients taste and smell is present. Patient denies any exposure. Patient has a PCR result awaiting from a visit with the [place].

## 2020-07-24 NOTE — Patient Instructions (Signed)
I encourage you to use Flonase and Zyrtec over-the-counter to help you with your symptoms.  We will call you tomorrow and let you know the status of availability of rapid Covid test.  Otherwise please let us know if your PCR Covid test is positive so we can maintain follow-up with you.  Please let us know if there is anything else we can do for you and I hope you feel better soon.   Kennieth Rad, PA-C Physician Assistant [facility] [email or web address]   Nonallergic Rhinitis Nonallergic rhinitis is inflammation of the mucous membrane inside the nose. The mucous membrane is the tissue that produces mucus. This condition is different from having allergic rhinitis, which is an allergy that affects the nose. Allergic rhinitis occurs when the body's defense system, or immune system, reacts to a substance that a person is allergic to (allergen), such as pollen, pet dander, mold, or dust. Nonallergic rhinitis has many similar symptoms, but it is not caused by allergens. Nonallergic rhinitis can be an acute or chronic problem. This means it can be short-term or long-term. What are the causes? This condition may be caused by many different things. Some common types of nonallergic rhinitis include:  Infectious rhinitis. This is usually caused by an infection in the nose, throat, or upper airways (upper respiratory system).  Vasomotor rhinitis. This is the most common type of chronic nonallergic rhinitis. It is caused by too much blood flow through your nose, and it leads to swelling in your nose. It is triggered by strong odors, cold air, stress, drinking alcohol, cigarette smoke, or changes in the weather.  Occupational rhinitis. This type is caused by triggers in the workplace, such as chemicals, dust, animal dander, or air pollution.  Hormonal rhinitis, in teenage girls and women. This type is caused by an increase in the hormone estrogen and  may happen during pregnancy, puberty, or monthly menstrual periods. Hormonal rhinitis gives you fewer symptoms when estrogen levels drop.  Drug-induced rhinitis. Several types of medicines can cause this, such as medicines for high blood pressure or heart disease, aspirin, or NSAIDs.  Nonallergic rhinitis with eosinophilia syndrome (NARES). This type is caused by having too much eosinophil, a type of white blood cell. Other causes include a reaction to eating hot or spicy foods. This does not usually cause long-term symptoms. In some cases, the cause of nonallergic rhinitis is not known. What increases the risk? You are more likely to develop this condition if:  You are 5-49 years of age.  You are a woman. Women are twice as likely to have this condition. What are the signs or symptoms? Common symptoms of this condition include:  Stuffy nose (nasal congestion).  Runny nose.  A feeling of mucus dripping down the back of your throat (postnasal drip).  Trouble sleeping.  Tiredness, or fatigue. Other symptoms include:  Sneezing.  Coughing.  Itchy nose.  Bloodshot eyes. How is this diagnosed? This type may be diagnosed based on:  Your symptoms and medical history.  A physical exam.  Allergy testing to rule out allergic rhinitis. You may have skin tests or blood tests. Your health care provider may also take a swab of nasal discharge to look for an increased number of eosinophils. This would be done to confirm a diagnosis of NARES. How is this treated? Treatment for this condition depends on the cause. No single treatment works for everyone. Work with your health care provider to find the best treatment for you. Treatment  may include:  Avoiding the things that trigger your symptoms.  Medicines to relieve congestion, such as: ? Steroid nasal spray. There are many types. You may need to try a few to find out which one works best. ? Decongestant medicine. This treats nasal  congestion and may be given by mouth or as a nasal spray. These medicines are used only for a short time.  Medicines to relieve a runny nose. These may include antihistamine medicines or anticholinergic nasal sprays.  Nasal irrigation. This involves using a salt-water (saline) spray or saline container called a neti pot. Nasal irrigation helps to clear away mucus and keep your nasal passages moist.  Surgery to remove part of your mucous membrane. This is done in severe cases if the condition has not improved after 6-12 months of treatment.   Follow these instructions at home: Medicines  Take or use over-the-counter and prescription medicines only as told by your health care provider. Do not stop using your medicine even if you start to feel better.  Do not take NSAIDs, such as ibuprofen, or medicines that contain aspirin if they make your symptoms worse. Lifestyle  Do not drink alcohol if it makes your symptoms worse.  Do not use any products that contain nicotine or tobacco, such as cigarettes, e-cigarettes, and chewing tobacco. If you need help quitting, ask your health care provider.  Avoid secondhand smoke. General instructions  Avoid triggers that make your symptoms worse.  Use nasal irrigation as told by your health care provider.  Get exercise. Exercise may help reduce symptoms for some people.  Sleep with the head of your bed raised. This may reduce nasal congestion when you sleep.  Drink enough fluid to keep your urine pale yellow.  Keep all follow-up visits as told by your health care provider. This is important. Contact a health care provider if:  You have a fever.  Your symptoms are getting worse at home.  Your symptoms do not lessen with medicine.  You develop new symptoms, especially a headache or nosebleed. Summary  Nonallergic rhinitis is inflammation inside the nose that is not caused by allergens. Nonallergic rhinitis can be a short-term or long-term  problem.  Treatment may include avoiding the things that trigger your symptoms.  Take or use over-the-counter and prescription medicines only as told by your health care provider. Do not stop using your medicine even if you start to feel better.  Contact a health care provider if your symptoms do not lessen with medicine. This information is not intended to replace advice given to you by your health care provider. Make sure you discuss any questions you have with your health care provider. Document Revised: 04/25/2019 Document Reviewed: 04/25/2019 Elsevier Patient Education  [place].

## 2020-07-24 NOTE — Progress Notes (Signed)
Established Patient Office Visit  Subjective:  [name], male    DOB: Oct 10, 1970  Age: 50 y.o. MRN: [id number]  CC:  Chief Complaint  Patient presents with  . URI   Virtual Visit via Telephone Note  I connected with Delman Kitten on 07/24/20 at  3:30 PM EST by telephone and verified that I am speaking with the correct person using two identifiers.  Location: Patient: Home Provider: Primary Care at [facility]   I discussed the limitations, risks, security and privacy concerns of performing an evaluation and management service by telephone and the availability of in person appointments. I also discussed with the patient that there may be a patient responsible charge related to this service. The patient expressed understanding and agreed to proceed.   History of Present Illness: Reports that he started having an itchy throat, runny nose, a dry cough and sneezing 2 days ago.  Reports clear drainage from his nose, denies history of seasonal allergies.  Reports he has been using Sudafed with a small amount of relief.  No known sick contacts, no known exposure to Covid.  Reports 2 Pfizer Covid vaccines with last one in July 2021.     Observations/Objective: Medical history and current medications reviewed, no physical exam completed   Past Medical History:  Diagnosis Date  . Diabetes mellitus without complication ([place])   . Fatty liver    abnormal CT 2014  . Heart disease   . Hyperlipidemia   . Kidney stones   . Sleep apnea    wears CPAP    Past Surgical History:  Procedure Laterality Date  . COLONOSCOPY    . CORONARY STENT INTERVENTION N/A 08/26/2019   Procedure: CORONARY STENT INTERVENTION;  Surgeon: Wellington Hampshire, MD;  Location: [place] [facility];  Service: Cardiovascular;  Laterality: N/A;  . KNEE ARTHROSCOPY Right   . LEFT HEART CATH AND CORONARY ANGIOGRAPHY N/A 08/26/2019   Procedure: LEFT HEART CATH AND CORONARY ANGIOGRAPHY;  Surgeon: Wellington Hampshire, MD;  Location: [place] [facility];  Service: Cardiovascular;  Laterality: N/A;  . UMBILICAL HERNIA REPAIR      Family History  Problem Relation Age of Onset  . Hypertension Father   . Diabetes Mother   . Sleep apnea Other   . Heart attack Maternal Grandfather   . Heart attack Paternal Grandfather   . Cancer Neg Hx   . Colon cancer Neg Hx   . Esophageal cancer Neg Hx   . Stomach cancer Neg Hx   . Rectal cancer Neg Hx     Social History   Socioeconomic History  . Marital status: Married    Spouse name: Not on file  . Number of children: 2  . Years of education: Not on file  . Highest education level: Not on file  Occupational History  . Occupation: Animal nutritionist  Tobacco Use  . Smoking status: Never Smoker  . Smokeless tobacco: Never Used  Vaping Use  . Vaping Use: Never used  Substance and Sexual Activity  . Alcohol use: Yes    Comment: Social  . Drug use: No  . Sexual activity: Yes  Other Topics Concern  . Not on file  Social History Narrative  . Not on file   Social Determinants of Health   Financial Resource Strain: Not on file  Food Insecurity: Not on file  Transportation Needs: Not on file  Physical Activity: Not on file  Stress: Not on file  Social  Connections: Not on file  Intimate Partner Violence: Not on file    Outpatient Medications Prior to Visit  Medication Sig Dispense Refill  . aspirin EC 81 MG tablet Take 1 tablet (81 mg total) by mouth daily. 90 tablet 3  . B-D UF III MINI PEN NEEDLES 31G X 5 MM MISC USE TO INJECT INSULIN DAILY 100 each 3  . clopidogrel (PLAVIX) 75 MG tablet Take 1 tablet (75 mg total) by mouth daily. 90 tablet 3  . glimepiride (AMARYL) 4 MG tablet TAKE 1 TABLET DAILY WITH BREAKFAST 90 tablet 3  . Homeopathic Products [facility] RELIEF EX) Apply 1 application topically daily as needed (muscle cramps / pains).    . insulin glargine (LANTUS SOLOSTAR) 100 UNIT/ML Solostar Pen Inject 48 Units into the skin daily.  15 mL 2  . Insulin Syringe-Needle U-100 (B-D INS SYRINGE 0.5CC/31GX5/16) 31G X 5/16" 0.5 ML MISC 1 each by Does not apply route at bedtime. 30 each 2  . JANUVIA 100 MG tablet TAKE 1 TABLET DAILY 90 tablet 3  . JARDIANCE 10 MG TABS tablet TAKE 1 TABLET DAILY BEFORE BREAKFAST 30 tablet 11  . metFORMIN (GLUCOPHAGE) 1000 MG tablet TAKE 1 TABLET TWICE A DAY WITH MEALS 180 tablet 3  . metoprolol succinate (TOPROL XL) 25 MG 24 hr tablet Take 1 tablet (25 mg total) by mouth daily. (Patient taking differently: Take 12.5 mg by mouth daily.) 90 tablet 3  . nitroGLYCERIN (NITROSTAT) 0.4 MG SL tablet Place 1 tablet (0.4 mg total) under the tongue every 5 (five) minutes as needed. 25 tablet 2  . rosuvastatin (CRESTOR) 10 MG tablet Take 1 tablet (10 mg total) by mouth daily. 30 tablet 11  . sildenafil (VIAGRA) 50 MG tablet Take 50 mg by mouth daily as needed for erectile dysfunction.    . Turmeric 500 MG CAPS Take 1,000 mg by mouth every evening.     No facility-administered medications prior to visit.    Allergies  Allergen Reactions  . Codeine Nausea And Vomiting and Other (See Comments)    headache  . Crestor [Rosuvastatin]     Elveated LFTs with Crestor 40mg  daily     ROS Review of Systems  Constitutional: Negative for chills, fatigue and fever.  HENT: Positive for congestion, postnasal drip and rhinorrhea. Negative for sinus pressure, sore throat and trouble swallowing.   Respiratory: Positive for cough. Negative for shortness of breath and wheezing.   Cardiovascular: Negative for chest pain.  Gastrointestinal: Negative for diarrhea, nausea and vomiting.  Endocrine: Negative.   Genitourinary: Negative.   Musculoskeletal: Negative for myalgias.  Skin: Negative.   Allergic/Immunologic: Negative.   Neurological: Negative for headaches.  Hematological: Negative.   Psychiatric/Behavioral: Negative.       Objective:     There were no vitals taken for this visit. Wt Readings from Last 3  Encounters:  07/05/20 208 lb 9.6 oz (94.6 kg)  06/19/20 210 lb 6.4 oz (95.4 kg)  11/24/19 203 lb 4 oz (92.2 kg)     Health Maintenance Due  Topic Date Due  . FOOT EXAM  08/12/2017  . OPHTHALMOLOGY EXAM  07/02/2019  . COVID-19 Vaccine (3 - Pfizer risk 4-dose series) 02/16/2020    There are no preventive care reminders to display for this patient.  Lab Results  Component Value Date   TSH 1.880 06/04/2013   Lab Results  Component Value Date   WBC 7.7 06/19/2020   HGB 14.8 06/19/2020   HCT 42.7 06/19/2020   MCV  88 06/19/2020   PLT 183 06/19/2020   Lab Results  Component Value Date   NA 143 06/19/2020   K 4.2 06/19/2020   CO2 24 06/19/2020   GLUCOSE 281 (H) 06/19/2020   BUN 15 06/19/2020   CREATININE 0.81 06/19/2020   BILITOT 0.8 06/19/2020   ALKPHOS 68 06/19/2020   AST 14 06/19/2020   ALT 21 06/19/2020   PROT 6.5 06/19/2020   ALBUMIN 4.4 06/19/2020   CALCIUM 9.2 06/19/2020   ANIONGAP 13 07/04/2019   Lab Results  Component Value Date   CHOL 105 12/23/2019   Lab Results  Component Value Date   HDL 34 (L) 12/23/2019   Lab Results  Component Value Date   LDLCALC 59 12/23/2019   Lab Results  Component Value Date   TRIG 50 12/23/2019   Lab Results  Component Value Date   CHOLHDL 3.1 12/23/2019   Lab Results  Component Value Date   HGBA1C 8.8 (A) 06/19/2020      Assessment & Plan:   Problem List Items Addressed This Visit      Respiratory   Acute rhinitis - Primary     Assessment and Plan: 1. Acute rhinitis Unable to provide rapid Covid testing at this time.  Patient did have Covid PCR testing completed today at the [place], is awaiting  results.  Encouraged symptomatic over-the-counter treatment including Flonase and Zyrtec.  Patient encouraged to follow-up with his human resources for their policies of returning to work.  Patient is afebrile    Follow Up Instructions:    I discussed the assessment and treatment plan with the patient. The  patient was provided an opportunity to ask questions and all were answered. The patient agreed with the plan and demonstrated an understanding of the instructions.   The patient was advised to call back or seek an in-person evaluation if the symptoms worsen or if the condition fails to improve as anticipated.  I provided 21 minutes of non-face-to-face time during this encounter.   Marieli Rudy S Jemma Rasp, PA-C   No orders of the defined types were placed in this encounter.   Follow-up: Return if symptoms worsen or fail to improve.    Loraine Grip Derreck Wiltsey, PA-C

## 2020-09-06 ENCOUNTER — Other Ambulatory Visit: Payer: Self-pay | Admitting: Family Medicine

## 2020-09-10 ENCOUNTER — Other Ambulatory Visit

## 2020-09-10 ENCOUNTER — Other Ambulatory Visit: Payer: Self-pay

## 2020-09-10 DIAGNOSIS — E1169 Type 2 diabetes mellitus with other specified complication: Secondary | ICD-10-CM

## 2020-09-10 LAB — LIPID PANEL
Chol/HDL Ratio: 3 ratio (ref 0.0–5.0)
Cholesterol, Total: 118 mg/dL (ref 100–199)
HDL: 39 mg/dL — ABNORMAL LOW (ref >39)
LDL Chol Calc (NIH): 62 mg/dL (ref 0–99)
Triglycerides: 86 mg/dL (ref 0–149)
VLDL Cholesterol Cal: 17 mg/dL (ref 5–40)

## 2020-09-10 LAB — ALT: ALT: 21 IU/L (ref 0–44)

## 2020-09-24 ENCOUNTER — Ambulatory Visit: Attending: Internal Medicine | Admitting: Internal Medicine

## 2020-09-24 ENCOUNTER — Other Ambulatory Visit: Payer: Self-pay

## 2020-09-24 DIAGNOSIS — Z794 Long term (current) use of insulin: Secondary | ICD-10-CM

## 2020-09-24 DIAGNOSIS — Z23 Encounter for immunization: Secondary | ICD-10-CM

## 2020-09-24 DIAGNOSIS — I2510 Atherosclerotic heart disease of native coronary artery without angina pectoris: Secondary | ICD-10-CM | POA: Diagnosis not present

## 2020-09-24 DIAGNOSIS — E1159 Type 2 diabetes mellitus with other circulatory complications: Secondary | ICD-10-CM

## 2020-09-24 NOTE — Progress Notes (Signed)
Virtual Visit via Telephone Note  I connected with Paul [name] on 09/24/20 at 8:55 a.m by telephone and verified that I am speaking with the correct person using two identifiers.  Location: Patient: work Provider: office  Participants: Myself Patient    I discussed the limitations, risks, security and privacy concerns of performing an evaluation and management service by telephone and the availability of in person appointments. I also discussed with the patient that there may be a patient responsible charge related to this service. The patient expressed understanding and agreed to proceed.   History of Present Illness: Hx of DM, HL,OSA on CPAP, ED (Viagra through [place]), CAD(3 vessle CAD with 80% mid-LAD s/p DES).  Last seen 05/2020.  DIABETES TYPE 2 Last A1C:   Lab Results  Component Value Date   HGBA1C 8.8 (A) 06/19/2020   Med Adherence:  [x]  Yes.  He reports taking 46 units of Lantus insulin and all 4 oral medications including Januvia, Jardiance, Metformin and Amaryl.  We have discussed possibly stopping some of his oral medications and adding Trulicity instead on last visit.  He was supposed to get back to me on that.  Patient states he has not made up his mind about it and would like to consider it some more. Medication side effects:  []  Yes    [x]  No Home Monitoring?  [x]  Yes  But not often.  "I need to do better with checking my blood sugars to see what my blood sugars are doing." Home glucose results range:  Diet Adherence:  Feels he is doing a little better.  wgh staying b/w 205-210 lbs Exercise: Not as often as he would like due to his work schedule.  He plans to start walking a little bit more. Hypoglycemic episodes?: [x]  Yes "but not very often."  He usually drinks some milk and has a protein bar for breakfast when he wakes up to go to work between 4-5 AM.  He usually has a break at work at 8 AM.  If he does not eat something during his break he can sometimes have low  blood sugars before lunch time comes around which is at 11:15 AM. Numbness of the feet? []  Yes    []  No Retinopathy hx? []  Yes    []  No Last eye exam:  Was suppose to send me a message with the name of provider to submit referral for eye exam.  Reports that he forgot to do so and will find out the information from his wife and send me a MyChart message today or tomorrow. Comments:    CAD/HL: saw Dr. Radford Pax 06/2020 Most recent lipid profile was at goal.  See results below.  ALT was normal on recent blood test. Denies any chest pains or shortness of breath at rest or with exertion.  Reports compliance with taking his heart medications including aspirin, Plavix, metoprolol and Crestor.  Pt had question about whether he provided MAB infusion if he were to get COVID infection.  Read that he would probably be at high risk for poor outcome given that he has diabetes and heart disease.  No Covid symptoms at this time. He is over due for COVID booster.  Plans to get it.  Current Outpatient Medications  Medication Instructions  . aspirin EC 81 mg, Oral, Daily  . B-D UF III MINI PEN NEEDLES 31G X 5 MM MISC USE TO INJECT INSULIN DAILY  . clopidogrel (PLAVIX) 75 mg, Oral, Daily  . glimepiride (AMARYL) 4  MG tablet TAKE 1 TABLET DAILY WITH BREAKFAST  . Homeopathic Products (THERAWORX RELIEF EX) 1 application, Apply externally, Daily PRN  . Insulin Syringe-Needle U-100 (B-D INS SYRINGE 0.5CC/31GX5/16) 31G X 5/16" 0.5 ML MISC 1 each, Does not apply, Daily at bedtime  . JANUVIA 100 MG tablet TAKE 1 TABLET DAILY  . JARDIANCE 10 MG TABS tablet TAKE 1 TABLET DAILY BEFORE BREAKFAST  . Lantus SoloStar 48 Units, Subcutaneous, Daily  . metFORMIN (GLUCOPHAGE) 1000 MG tablet TAKE 1 TABLET TWICE A DAY WITH MEALS  . metoprolol succinate (TOPROL XL) 25 mg, Oral, Daily  . nitroGLYCERIN (NITROSTAT) 0.4 mg, Sublingual, Every 5 min PRN  . rosuvastatin (CRESTOR) 10 mg, Oral, Daily  . sildenafil (VIAGRA) 50 mg, Oral, Daily  PRN  . Turmeric 1,000 mg, Oral, Every evening      Observations/Objective: Results for orders placed or performed in visit on 09/10/20  ALT  Result Value Ref Range   ALT 21 0 - 44 IU/L  Lipid panel  Result Value Ref Range   Cholesterol, Total 118 100 - 199 mg/dL   Triglycerides 86 0 - 149 mg/dL   HDL 39 (L) >39 mg/dL   VLDL Cholesterol Cal 17 5 - 40 mg/dL   LDL Chol Calc (NIH) 62 0 - 99 mg/dL   Chol/HDL Ratio 3.0 0.0 - 5.0 ratio     Chemistry      Component Value Date/Time   NA 143 06/19/2020 1410   K 4.2 06/19/2020 1410   CL 103 06/19/2020 1410   CO2 24 06/19/2020 1410   BUN 15 06/19/2020 1410   CREATININE 0.81 06/19/2020 1410   CREATININE 0.84 08/12/2016 1704      Component Value Date/Time   CALCIUM 9.2 06/19/2020 1410   ALKPHOS 68 06/19/2020 1410   AST 14 06/19/2020 1410   ALT 21 09/10/2020 0749   BILITOT 0.8 06/19/2020 1410     Lab Results  Component Value Date   HGBA1C 8.8 (A) 06/19/2020   Lab Results  Component Value Date   WBC 7.7 06/19/2020   HGB 14.8 06/19/2020   HCT 42.7 06/19/2020   MCV 88 06/19/2020   PLT 183 06/19/2020     Assessment and Plan: 1. Type 2 diabetes mellitus with other circulatory complication, with long-term current use of insulin ([place]) -Encouraged to check blood sugars if only once a day advised to check with his insurance to see whether they pay for the [place] or Dexcom devices.  Either of these would be a good option for him which will provide more convenience with checking his blood sugars. -Continue current medications.  Let me know if he decides to try the Trulicity as previously discussed. -He will provide me with the name of the provider for eye referral via [facility]. - Hemoglobin A1c; Future  2. Coronary artery disease involving native coronary artery of native heart without angina pectoris Stable on current medications.  Continue Crestor, Plavix, aspirin and metoprolol.  3. COVID-19 vaccine series started Advised  patient that he would be at high risk for poor outcome if he were to get Covid given his chronic medical conditions.  However if he were to get Covid, I think he would be a candidate for MAB provided it is called within the timeframe.  I have strongly encouraged him to get his COVID booster vaccine.  He promised me that he will.   Follow Up Instructions: 4 mths   I discussed the assessment and treatment plan with the patient. The patient was  provided an opportunity to ask questions and all were answered. The patient agreed with the plan and demonstrated an understanding of the instructions.   The patient was advised to call back or seek an in-person evaluation if the symptoms worsen or if the condition fails to improve as anticipated.  I spent 16 minutes on this telephone encounter.  Karle Plumber, MD

## 2020-09-28 ENCOUNTER — Other Ambulatory Visit: Payer: Self-pay

## 2020-09-28 ENCOUNTER — Ambulatory Visit: Attending: Internal Medicine

## 2020-09-28 ENCOUNTER — Encounter (INDEPENDENT_AMBULATORY_CARE_PROVIDER_SITE_OTHER): Payer: Self-pay

## 2020-09-28 DIAGNOSIS — E1159 Type 2 diabetes mellitus with other circulatory complications: Secondary | ICD-10-CM

## 2020-09-28 DIAGNOSIS — Z794 Long term (current) use of insulin: Secondary | ICD-10-CM

## 2020-09-29 LAB — HEMOGLOBIN A1C
Est. average glucose Bld gHb Est-mCnc: 220 mg/dL
Hgb A1c MFr Bld: 9.3 % — ABNORMAL HIGH (ref 4.8–5.6)

## 2020-10-18 ENCOUNTER — Other Ambulatory Visit: Payer: Self-pay | Admitting: Internal Medicine

## 2020-10-18 DIAGNOSIS — Z794 Long term (current) use of insulin: Secondary | ICD-10-CM

## 2020-10-18 DIAGNOSIS — E119 Type 2 diabetes mellitus without complications: Secondary | ICD-10-CM

## 2020-10-18 NOTE — Telephone Encounter (Signed)
Requested Prescriptions  Pending Prescriptions Disp Refills  . LANTUS SOLOSTAR 100 UNIT/ML Solostar Pen [Pharmacy Med Name: LANTUS SOLOSTAR PEN 100U/ML] 15 mL 10    Sig: INJECT 48 UNITS UNDER THE SKIN DAILY     Endocrinology:  Diabetes - Insulins Failed - 10/18/2020  4:31 AM      Failed - HBA1C is between 0 and 7.9 and within 180 days    HbA1c, POC (controlled diabetic range)  Date Value Ref Range Status  06/19/2020 8.8 (A) 0.0 - 7.0 % Final   Hgb A1c MFr Bld  Date Value Ref Range Status  09/28/2020 9.3 (H) 4.8 - 5.6 % Final    Comment:             Prediabetes: 5.7 - 6.4          Diabetes: >6.4          Glycemic control for adults with diabetes: <7.0          Passed - Valid encounter within last 6 months    Recent Outpatient Visits          3 weeks ago Type 2 diabetes mellitus with other circulatory complication, with long-term current use of insulin ([place])   [place], Neoma Laming B, MD   4 months ago Type 2 diabetes mellitus with other circulatory complication, with long-term current use of insulin ([place])   [place] Ladell Pier, MD   7 months ago Need for influenza vaccination   [place], Annie Main L, RPH-CPP   8 months ago Type 2 diabetes mellitus with other circulatory complication, with long-term current use of insulin [facility])   [place] Karle Plumber B, MD   1 year ago Type 2 diabetes mellitus with other circulatory complication, with long-term current use of insulin [facility])   [place] [facility] Ladell Pier, MD

## 2020-11-16 ENCOUNTER — Other Ambulatory Visit: Payer: Self-pay | Admitting: Internal Medicine

## 2020-11-16 DIAGNOSIS — E119 Type 2 diabetes mellitus without complications: Secondary | ICD-10-CM

## 2020-11-16 DIAGNOSIS — Z794 Long term (current) use of insulin: Secondary | ICD-10-CM

## 2020-11-16 NOTE — Telephone Encounter (Signed)
Requested Prescriptions  Pending Prescriptions Disp Refills  . metFORMIN (GLUCOPHAGE) 1000 MG tablet [Pharmacy Med Name: METFORMIN HCL TABS 1000MG] 180 tablet 1    Sig: TAKE 1 TABLET TWICE A DAY WITH MEALS     Endocrinology:  Diabetes - Biguanides Failed - 11/16/2020  3:45 AM      Failed - HBA1C is between 0 and 7.9 and within 180 days    HbA1c, POC (controlled diabetic range)  Date Value Ref Range Status  06/19/2020 8.8 (A) 0.0 - 7.0 % Final   Hgb A1c MFr Bld  Date Value Ref Range Status  09/28/2020 9.3 (H) 4.8 - 5.6 % Final    Comment:             Prediabetes: 5.7 - 6.4          Diabetes: >6.4          Glycemic control for adults with diabetes: <7.0          Passed - Cr in normal range and within 360 days    Creat  Date Value Ref Range Status  08/12/2016 0.84 0.60 - 1.35 mg/dL Final   Creatinine, Ser  Date Value Ref Range Status  06/19/2020 0.81 0.76 - 1.27 mg/dL Final   Creatinine, Urine  Date Value Ref Range Status  08/12/2016 61 20 - 370 mg/dL Final         Passed - eGFR in normal range and within 360 days    GFR, Est African American  Date Value Ref Range Status  08/12/2016 >89 >=60 mL/min Final   GFR calc Af Amer  Date Value Ref Range Status  06/19/2020 119 >59 mL/min/1.73 Final    Comment:    **In accordance with recommendations from the NKF-ASN Task force,**   [facility] is in the process of updating its eGFR calculation to the   2021 CKD-EPI creatinine equation that estimates kidney function   without a race variable.    GFR, Est Non African American  Date Value Ref Range Status  08/12/2016 >89 >=60 mL/min Final   GFR calc non Af Amer  Date Value Ref Range Status  06/19/2020 103 >59 mL/min/1.73 Final         Passed - Valid encounter within last 6 months    Recent Outpatient Visits          1 month ago Type 2 diabetes mellitus with other circulatory complication, with long-term current use of insulin ([place])   [place], Neoma Laming B, MD   5 months ago Type 2 diabetes mellitus with other circulatory complication, with long-term current use of insulin ([place])   [place] Ladell Pier, MD   8 months ago Need for influenza vaccination   [place], Annie Main L, RPH-CPP   9 months ago Type 2 diabetes mellitus with other circulatory complication, with long-term current use of insulin [facility])   [place] Karle Plumber B, MD   1 year ago Type 2 diabetes mellitus with other circulatory complication, with long-term current use of insulin [facility])   [place] Wynetta Emery, Dalbert Batman, MD             . JANUVIA 100 MG tablet [Pharmacy Med Name: JANUVIA TABS 100MG] 90 tablet 1    Sig: TAKE 1 TABLET DAILY     Endocrinology:  Diabetes - DPP-4 Inhibitors Failed - 11/16/2020  3:45 AM      Failed - HBA1C is between 0 and 7.9 and within 180 days    HbA1c, POC (controlled diabetic range)  Date Value Ref Range Status  06/19/2020 8.8 (A) 0.0 - 7.0 % Final   Hgb A1c MFr Bld  Date Value Ref Range Status  09/28/2020 9.3 (H) 4.8 - 5.6 % Final    Comment:             Prediabetes: 5.7 - 6.4          Diabetes: >6.4          Glycemic control for adults with diabetes: <7.0          Passed - Cr in normal range and within 360 days    Creat  Date Value Ref Range Status  08/12/2016 0.84 0.60 - 1.35 mg/dL Final   Creatinine, Ser  Date Value Ref Range Status  06/19/2020 0.81 0.76 - 1.27 mg/dL Final   Creatinine, Urine  Date Value Ref Range Status  08/12/2016 61 20 - 370 mg/dL Final         Passed - Valid encounter within last 6 months    Recent Outpatient Visits          1 month ago Type 2 diabetes mellitus with other circulatory complication, with long-term current use of insulin ([place])   [place] [employer], Neoma Laming B, MD   5 months ago Type  2 diabetes mellitus with other circulatory complication, with long-term current use of insulin ([place])   [place] Ladell Pier, MD   8 months ago Need for influenza vaccination   [place], Annie Main L, RPH-CPP   9 months ago Type 2 diabetes mellitus with other circulatory complication, with long-term current use of insulin [facility])   [place] Karle Plumber B, MD   1 year ago Type 2 diabetes mellitus with other circulatory complication, with long-term current use of insulin [facility])   [place] [facility] Ladell Pier, MD

## 2020-12-07 ENCOUNTER — Other Ambulatory Visit: Payer: Self-pay | Admitting: Internal Medicine

## 2020-12-07 ENCOUNTER — Other Ambulatory Visit: Payer: Self-pay | Admitting: Cardiology

## 2020-12-07 DIAGNOSIS — Z794 Long term (current) use of insulin: Secondary | ICD-10-CM

## 2020-12-07 DIAGNOSIS — E119 Type 2 diabetes mellitus without complications: Secondary | ICD-10-CM

## 2021-01-24 ENCOUNTER — Other Ambulatory Visit: Payer: Self-pay

## 2021-01-24 ENCOUNTER — Ambulatory Visit: Attending: Physician Assistant | Admitting: Physician Assistant

## 2021-01-24 ENCOUNTER — Encounter: Payer: Self-pay | Admitting: Physician Assistant

## 2021-01-24 VITALS — BP 132/78 | HR 63 | Ht 70.0 in

## 2021-01-24 DIAGNOSIS — I2510 Atherosclerotic heart disease of native coronary artery without angina pectoris: Secondary | ICD-10-CM

## 2021-01-24 DIAGNOSIS — E1159 Type 2 diabetes mellitus with other circulatory complications: Secondary | ICD-10-CM | POA: Diagnosis not present

## 2021-01-24 DIAGNOSIS — Z794 Long term (current) use of insulin: Secondary | ICD-10-CM | POA: Diagnosis not present

## 2021-01-24 DIAGNOSIS — E785 Hyperlipidemia, unspecified: Secondary | ICD-10-CM

## 2021-01-24 DIAGNOSIS — E1169 Type 2 diabetes mellitus with other specified complication: Secondary | ICD-10-CM

## 2021-01-24 LAB — POCT GLYCOSYLATED HEMOGLOBIN (HGB A1C): HbA1c, POC (controlled diabetic range): 8.6 % — AB (ref 0.0–7.0)

## 2021-01-24 LAB — GLUCOSE, POCT (MANUAL RESULT ENTRY): POC Glucose: 81 mg/dl (ref 70–99)

## 2021-01-24 MED ORDER — ROSUVASTATIN CALCIUM 10 MG PO TABS: ORAL_TABLET | ORAL | 7 refills | Status: DC

## 2021-01-24 MED ORDER — CLOPIDOGREL BISULFATE 75 MG PO TABS: 75.0000 mg | ORAL_TABLET | Freq: Every day | ORAL | 2 refills | Status: DC

## 2021-01-24 MED ORDER — METFORMIN HCL 1000 MG PO TABS: 1000.0000 mg | ORAL_TABLET | Freq: Two times a day (BID) | ORAL | 1 refills | Status: DC

## 2021-01-24 MED ORDER — METOPROLOL SUCCINATE ER 25 MG PO TB24: 12.5000 mg | ORAL_TABLET | Freq: Every day | ORAL | 5 refills | Status: DC

## 2021-01-24 MED ORDER — GLIMEPIRIDE 4 MG PO TABS: 4.0000 mg | ORAL_TABLET | Freq: Every day | ORAL | 1 refills | Status: DC

## 2021-01-24 MED ORDER — TRULICITY 0.75 MG/0.5ML ~~LOC~~ SOAJ: 0.7500 mg | SUBCUTANEOUS | 0 refills | Status: DC

## 2021-01-24 MED ORDER — LANTUS SOLOSTAR 100 UNIT/ML ~~LOC~~ SOPN: PEN_INJECTOR | SUBCUTANEOUS | 10 refills | Status: DC

## 2021-01-24 MED ORDER — EMPAGLIFLOZIN 10 MG PO TABS: 10.0000 mg | ORAL_TABLET | Freq: Every day | ORAL | 1 refills | Status: DC

## 2021-01-24 MED ORDER — BD PEN NEEDLE MINI U/F 31G X 5 MM MISC: 2 refills | Status: DC

## 2021-01-24 NOTE — Progress Notes (Signed)
Paul [name], is a 50 y.o. male  [id number]  [id number]  DOB - [birth year]  Subjective:  Chief Complaint and HPI: Paul [name] is a 50 y.o. male here today for med RF and diabetes check.  He is compliant with meds but not so much with diet. He is taking 50 units on his lantus.  Denies CP/SOB/HA.  No hypoglycemia.  He is interested in trulicity.    ROS:   Constitutional:  No f/c, No night sweats, No unexplained weight loss. EENT:  No vision changes, No blurry vision, No hearing changes. No mouth, throat, or ear problems.  Respiratory: No cough, No SOB Cardiac: No CP, no palpitations GI:  No abd pain, No N/V/D. GU: No Urinary s/sx Musculoskeletal: No joint pain Neuro: No headache, no dizziness, no motor weakness.  Skin: No rash Endocrine:  No polydipsia. No polyuria.  Psych: Denies SI/HI  No problems updated.  ALLERGIES: Allergies  Allergen Reactions   Codeine Nausea And Vomiting and Other (See Comments)    headache   Crestor [Rosuvastatin]     Elveated LFTs with Crestor '40mg'$  daily     PAST MEDICAL HISTORY: Past Medical History:  Diagnosis Date   Diabetes mellitus without complication ([place])    Fatty liver    abnormal CT 2014   Heart disease    Hyperlipidemia    Kidney stones    Sleep apnea    wears CPAP    MEDICATIONS AT HOME: Prior to Admission medications   Medication Sig Start Date End Date Taking? Authorizing Provider  aspirin EC 81 MG tablet Take 1 tablet (81 mg total) by mouth daily. 08/22/19   Paul Margarita, MD  clopidogrel (PLAVIX) 75 MG tablet Take 1 tablet (75 mg total) by mouth daily. 01/24/21   Paul Donovan, PA-C  Dulaglutide (TRULICITY) A999333 0000000 SOPN Inject 0.75 mg into the skin once a week. 01/24/21   Paul Donovan, PA-C  empagliflozin (JARDIANCE) 10 MG TABS tablet Take 1 tablet (10 mg total) by mouth daily before breakfast. 01/24/21   Paul Donovan, PA-C  glimepiride (AMARYL) 4 MG tablet Take 1 tablet (4 mg total) by  mouth daily with breakfast. 01/24/21   Paul Donovan, PA-C  Homeopathic Products [facility] RELIEF EX) Apply 1 application topically daily as needed (muscle cramps / pains).    [provider]  insulin glargine (LANTUS SOLOSTAR) 100 UNIT/ML Solostar Pen INJECT 48 UNITS UNDER THE SKIN DAILY 01/24/21   Paul Donovan, PA-C  Insulin Pen Needle (B-D UF III MINI PEN NEEDLES) 31G X 5 MM MISC As directed 01/24/21   Paul Donovan, PA-C  Insulin Syringe-Needle U-100 (B-D INS SYRINGE 0.5CC/31GX5/16) 31G X 5/16" 0.5 ML MISC 1 each by Does not apply route at bedtime. 01/19/17   Paul Nearing, MD  metFORMIN (GLUCOPHAGE) 1000 MG tablet Take 1 tablet (1,000 mg total) by mouth 2 (two) times daily with a meal. 01/24/21   Paul [name], Dionne Bucy, PA-C  metoprolol succinate (TOPROL XL) 25 MG 24 hr tablet Take 0.5 tablets (12.5 mg total) by mouth daily. 01/24/21   Paul Donovan, PA-C  nitroGLYCERIN (NITROSTAT) 0.4 MG SL tablet Place 1 tablet (0.4 mg total) under the tongue every 5 (five) minutes as needed. 08/26/19   Paul Manly, NP  rosuvastatin (CRESTOR) 10 MG tablet TAKE 1 TABLET(10 MG) BY MOUTH DAILY 01/24/21   Paul Donovan, PA-C  sildenafil (VIAGRA) 50 MG tablet Take 50 mg by mouth daily as needed for erectile dysfunction.  [provider]  Turmeric 500 MG CAPS Take 1,000 mg by mouth every evening.    [provider]     Objective:  EXAM:   Vitals:   01/24/21 0947  BP: 132/78  Pulse: 63  SpO2: 97%  Height: '5\' 10"'$  (1.778 m)    General appearance : A&OX3. NAD. Non-toxic-appearing HEENT: Atraumatic and Normocephalic.  PERRLA. EOM intact.   Neck: supple, no JVD. No cervical lymphadenopathy. No thyromegaly Chest/Lungs:  Breathing-non-labored, Good air entry bilaterally, breath sounds normal without rales, rhonchi, or wheezing  CVS: S1 S2 regular, no murmurs, gallops, rubs  Extremities: Bilateral Lower Ext shows no edema, both legs are warm to touch with = pulse  throughout Neurology:  CN II-XII grossly intact, Non focal.   Psych:  TP linear. J/I WNL. Normal speech. Appropriate eye contact and affect.  Skin:  No Rash  Data Review Lab Results  Component Value Date   HGBA1C 8.6 (A) 01/24/2021   HGBA1C 9.3 (H) 09/28/2020   HGBA1C 8.8 (A) 06/19/2020     Assessment & Plan   1. Type 2 diabetes mellitus with other circulatory complication, with long-term current use of insulin (HCC) Improved.  He wants to try trulicity.  I conferred with Milinda Pointer, PharmD.  We will stop Januvia, start trulicity.  Check blood sugars fasting and bedtime and bring to next visit.   - Glucose (CBG) - HgB A1c - Insulin Pen Needle (B-D UF III MINI PEN NEEDLES) 31G X 5 MM MISC; As directed  Dispense: 100 each; Refill: 2 - glimepiride (AMARYL) 4 MG tablet; Take 1 tablet (4 mg total) by mouth daily with breakfast.  Dispense: 90 tablet; Refill: 1 - empagliflozin (JARDIANCE) 10 MG TABS tablet; Take 1 tablet (10 mg total) by mouth daily before breakfast.  Dispense: 90 tablet; Refill: 1 - insulin glargine (LANTUS SOLOSTAR) 100 UNIT/ML Solostar Pen; INJECT 48 UNITS UNDER THE SKIN DAILY  Dispense: 15 mL; Refill: 10 - metFORMIN (GLUCOPHAGE) 1000 MG tablet; Take 1 tablet (1,000 mg total) by mouth 2 (two) times daily with a meal.  Dispense: 180 tablet; Refill: 1 - Comprehensive metabolic panel - CBC with Differential/Platelet - Dulaglutide (TRULICITY) A999333 0000000 SOPN; Inject 0.75 mg into the skin once a week.  Dispense: 2 mL; Refill: 0  2. Coronary artery disease involving native coronary artery of native heart without angina pectoris Keep f/ups with cardiology - metoprolol succinate (TOPROL XL) 25 MG 24 hr tablet; Take 0.5 tablets (12.5 mg total) by mouth daily.  Dispense: 30 tablet; Refill: 5 - rosuvastatin (CRESTOR) 10 MG tablet; TAKE 1 TABLET(10 MG) BY MOUTH DAILY  Dispense: 30 tablet; Refill: 7  4. Dyslipidemia associated with type 2 diabetes mellitus (HCC) -  rosuvastatin (CRESTOR) 10 MG tablet; TAKE 1 TABLET(10 MG) BY MOUTH DAILY  Dispense: 30 tablet; Refill: 7 - Comprehensive metabolic panel - Lipid panel - CBC with Differential/Platelet   Patient have been counseled extensively about nutrition and exercise  Return for [facility] in 2 weeks;  PCP in 3 months.  The patient was given clear instructions to go to ER or return to medical center if symptoms don't improve, worsen or new problems develop. The patient verbalized understanding. The patient was told to call to get lab results if they haven't heard anything in the next week.     Freeman Caldron, PA-C [facility] [place], [place]   01/24/2021, 10:14 AM Patient ID: LAWAN HITCHINS, male   DOB: Nov 18, 1970, 50 y.o.  MRN: [id number]

## 2021-01-24 NOTE — Patient Instructions (Addendum)
Check blood sugars fasting and bedtime and bring to next visit.  Stop [place].  Start trulicity once weekly

## 2021-01-25 LAB — COMPREHENSIVE METABOLIC PANEL
ALT: 21 IU/L (ref 0–44)
AST: 20 IU/L (ref 0–40)
Albumin/Globulin Ratio: 2.3 — ABNORMAL HIGH (ref 1.2–2.2)
Albumin: 4.5 g/dL (ref 3.8–4.9)
Alkaline Phosphatase: 74 IU/L (ref 44–121)
BUN/Creatinine Ratio: 18 (ref 9–20)
BUN: 16 mg/dL (ref 6–24)
Bilirubin Total: 0.6 mg/dL (ref 0.0–1.2)
CO2: 22 mmol/L (ref 20–29)
Calcium: 9.5 mg/dL (ref 8.7–10.2)
Chloride: 106 mmol/L (ref 96–106)
Creatinine, Ser: 0.88 mg/dL (ref 0.76–1.27)
Globulin, Total: 2 g/dL (ref 1.5–4.5)
Glucose: 64 mg/dL — ABNORMAL LOW (ref 65–99)
Potassium: 4.2 mmol/L (ref 3.5–5.2)
Sodium: 144 mmol/L (ref 134–144)
Total Protein: 6.5 g/dL (ref 6.0–8.5)
eGFR: 104 mL/min/{1.73_m2} (ref >59)

## 2021-01-25 LAB — CBC WITH DIFFERENTIAL/PLATELET
Basophils Absolute: 0.1 10*3/uL (ref 0.0–0.2)
Basos: 1 %
EOS (ABSOLUTE): 0.5 10*3/uL — ABNORMAL HIGH (ref 0.0–0.4)
Eos: 6 %
Hematocrit: 46.1 % (ref 37.5–51.0)
Hemoglobin: 15.5 g/dL (ref 13.0–17.7)
Immature Grans (Abs): 0 10*3/uL (ref 0.0–0.1)
Immature Granulocytes: 0 %
Lymphocytes Absolute: 2 10*3/uL (ref 0.7–3.1)
Lymphs: 25 %
MCH: 30 pg (ref 26.6–33.0)
MCHC: 33.6 g/dL (ref 31.5–35.7)
MCV: 89 fL (ref 79–97)
Monocytes Absolute: 0.6 10*3/uL (ref 0.1–0.9)
Monocytes: 8 %
Neutrophils Absolute: 4.9 10*3/uL (ref 1.4–7.0)
Neutrophils: 60 %
Platelets: 191 10*3/uL (ref 150–450)
RBC: 5.17 x10E6/uL (ref 4.14–5.80)
RDW: 12.5 % (ref 11.6–15.4)
WBC: 8.1 10*3/uL (ref 3.4–10.8)

## 2021-01-25 LAB — LIPID PANEL
Chol/HDL Ratio: 3.3 ratio (ref 0.0–5.0)
Cholesterol, Total: 119 mg/dL (ref 100–199)
HDL: 36 mg/dL — ABNORMAL LOW (ref >39)
LDL Chol Calc (NIH): 63 mg/dL (ref 0–99)
Triglycerides: 109 mg/dL (ref 0–149)
VLDL Cholesterol Cal: 20 mg/dL (ref 5–40)

## 2021-02-04 NOTE — Progress Notes (Signed)
S:    PCP: Dr. Wynetta Emery  Patient presents for diabetes evaluation, education, and management. Patient was referred on 01/24/21 by Freeman Caldron, PA-C. Patient was last seen by Primary Care Provider on 09/24/20 via telemedicine visit. At 7/28 visit with Levada Dy, patient was interested in starting Trulicity. Trulicity A999333 mg weekly started and Januvia discontinued.   Today, patient arrives in good spirits. Has taken Trulicity for 2 weeks now and denies any adverse effects. Has been checking BG at home once daily. Experiencing a few hypoglycemic events at night where he felt lightheaded and shaky and when he checked his BG it was in the 50s.   Insurance coverage/medication affordability: Tricare  Medication adherence reported.   Current diabetes medications include: Trulicity A999333 mg once weekly, metformin 1000 mg BID, Jardiance 10 mg daily, glimepiride 4 mg daily with breakfast, Lantus 48 units daily Current hypertension medications include: metoprolol succinate 12.5 mg daily (CAD) Current hyperlipidemia medications include: rosuvastatin 10 mg daily  Patient reports hypoglycemic events.  Patient reported dietary habits:  Notices high when eats any carbs like breads, pasta Eats something sugary most nights of the week Drinks water, tea 1/2 sweet/unsweet  Patient-reported exercise habits: Works out at [employer] 4x/week, walks 20 mins per day at work   Patient denies nocturia (nighttime urination).  Patient denies neuropathy (nerve pain). Patient denies visual changes. Patient reports self foot exams.   O:  POCT BG: 158 (ate protein bar and sips of tea this morning ~2-3 hours ago)  Lab Results  Component Value Date   HGBA1C 8.6 (A) 01/24/2021   There were no vitals filed for this visit.  Lipid Panel     Component Value Date/Time   CHOL 119 01/24/2021 1021   TRIG 109 01/24/2021 1021   HDL 36 (L) 01/24/2021 1021   CHOLHDL 3.3 01/24/2021 1021   CHOLHDL 4.9 08/12/2016 1704   VLDL  23 08/12/2016 1704   LDLCALC 63 01/24/2021 1021    Home fasting blood sugars:127, 139, 182, 218, 53, 54, 68, 228, 108 2 hour post-meal/random blood sugars: 291  Clinical Atherosclerotic Cardiovascular Disease (ASCVD): Yes  The ASCVD Risk score Mikey Bussing DC Jr., et al., 2013) failed to calculate for the following reasons:   The valid total cholesterol range is 130 to 320 mg/dL   A/P: Diabetes currently uncontrolled with most recent A1c 8.6 8.6 on 7/28, down from 9.3 in April. Patient is able to verbalize appropriate hypoglycemia management plan. Medication adherence appears appropriate. He is tolerating Trulicity well, but is experiencing multiple episodes of hypoglycemia. With improving BG on Trulicity, hope to decrease medications that act glucose independently (lower BG even if BG is not high) to prevent hypoglycemia, which include glimepiride and insulin. Patient asks about his kidneys. Last UACR is normal. Educated him that his Jardiance and Trulicity both protect his kidneys and reduce the risk of future ASCVD events.  -Discontinue glimperide due to hypoglycemia -Continue Trulicity A999333 mg once weekly to complete 4 weeks total, then increase to Trulicity 1.5 mg once weekly -Continue metformin 1000 mg BID, Jardiance 10 mg daily, and Lantus 48 units daily -Instructed patient to call if he experiences any low blood sugars prior to next visit  -Counseled on adverse effects of Trulicity -Extensively discussed pathophysiology of diabetes, recommended lifestyle interventions, dietary effects on blood sugar control -Counseled on s/sx of and management of hypoglycemia -Next A1C anticipated 04/26/21 at next PCP Visit.   ASCVD risk - secondary prevention in patient with diabetes. Last LDL is controlled  at 63 mg/dL (01/24/21). High-intensity statin indicated, however hx LFT elevations with rosuvastatin 40 mg daily, tolerating 10 mg daily and at goal LDL.  -Continue rosuvastatin 10 mg daily  Total time  in face to face counseling 30 minutes. Follow up Pharmacist visit in 4 weeks.   Rebbeca Paul, PharmD PGY2 Ambulatory Care Pharmacy Resident 02/06/2021 9:32 AM

## 2021-02-06 ENCOUNTER — Other Ambulatory Visit: Payer: Self-pay

## 2021-02-06 ENCOUNTER — Ambulatory Visit: Attending: Internal Medicine | Admitting: Pharmacist

## 2021-02-06 ENCOUNTER — Encounter: Payer: Self-pay | Admitting: Pharmacist

## 2021-02-06 DIAGNOSIS — Z794 Long term (current) use of insulin: Secondary | ICD-10-CM

## 2021-02-06 DIAGNOSIS — E1159 Type 2 diabetes mellitus with other circulatory complications: Secondary | ICD-10-CM

## 2021-02-06 LAB — GLUCOSE, POCT (MANUAL RESULT ENTRY): POC Glucose: 158 mg/dl — AB (ref 70–99)

## 2021-02-06 MED ORDER — TRULICITY 1.5 MG/0.5ML ~~LOC~~ SOAJ: 1.5000 mg | SUBCUTANEOUS | 0 refills | Status: DC

## 2021-02-06 NOTE — Patient Instructions (Addendum)
It was nice to see you today!  Your goal blood sugar is 80-130 before eating and less than 180 after eating.  Medication Changes: -Discontinue glimperide due to hypoglycemia -Continue Trulicity A999333 mg once weekly to complete 4 weeks total, then increase to Trulicity 1.5 mg once weekly -Instructed patient to call if he experiences any low blood sugars prior to next visit   Monitor blood sugars at home and keep a log (glucometer or piece of paper) to bring with you to your next visit.  Keep up the good work with diet and exercise. Aim for a diet full of vegetables, fruit and lean meats (chicken, [place], fish). Try to limit salt intake by eating fresh or frozen vegetables (instead of canned), rinse canned vegetables prior to cooking and do not add any additional salt to meals.

## 2021-03-05 NOTE — Progress Notes (Signed)
S:    PCP: Dr. Wynetta Emery  Patient presents for diabetes evaluation, education, and management. Patient was referred on 01/24/21 by Freeman Caldron, PA-C. Patient was last seen by Primary Care Provider on 09/24/20 via telemedicine visit. At last CPP visit on 8/10, patient reported hypoglycemic events. Increased Trulicity to 1.5 mg weekly and discontinued glimepiride.   Today, patient arrives in good spirits. Reports that he has taken 2 doses of the increased Trulicity dose so far. Has been having some constipation and early satiety, which we discussed could happen with the increased dose at last visit. Reports he knows he needs to stop eating when he starts feeling full. He has been having less hypoglycemic events since stopping glimepiride with only one reported low BG, however, he endorses symptoms of hypoglycemia daily at work around 10:30am and eats a snack. Unable to check BG at those times. Willing to continue increasing the dose of Trulicity to help back down on insulin and prevent hypoglycemic events.   Insurance coverage/medication affordability: Tricare  Medication adherence reported.   Current diabetes medications include: Trulicity 1.5 mg once weekly (Mondays), metformin 1000 mg BID, Jardiance 10 mg daily, Lantus 50 units daily Current hypertension medications include: metoprolol succinate 12.5 mg daily (CAD) Current hyperlipidemia medications include: rosuvastatin 10 mg daily  Patient reports hypoglycemic events.  Patient reported dietary habits:  Notices high when eats any carbs like breads, pasta Eats something sugary most nights of the week Drinks water, tea 1/2 sweet/unsweet Trying to stop eating when he feels full  Patient-reported exercise habits: Works out at [employer] 4x/week, walks 20 mins per day at work  Patient denies nocturia (nighttime urination).  Patient denies neuropathy (nerve pain). Patient denies visual changes. Patient reports self foot exams.   O:  POCT  BG: 153 (ate protein bar with 15g carbs ~3 hours ago)  Lab Results  Component Value Date   HGBA1C 8.6 (A) 01/24/2021   There were no vitals filed for this visit.  Lipid Panel     Component Value Date/Time   CHOL 119 01/24/2021 1021   TRIG 109 01/24/2021 1021   HDL 36 (L) 01/24/2021 1021   CHOLHDL 3.3 01/24/2021 1021   CHOLHDL 4.9 08/12/2016 1704   VLDL 23 08/12/2016 1704   LDLCALC 63 01/24/2021 1021    Home fasting blood sugars: 108, 89, 212, 178, 167, 169, 179, 149, 141, 52, 123, 213, 141, 181, 279, 198, 184, 179, 257  Clinical Atherosclerotic Cardiovascular Disease (ASCVD): Yes  The ASCVD Risk score Mikey Bussing DC Jr., et al., 2013) failed to calculate for the following reasons:   The valid total cholesterol range is 130 to 320 mg/dL   A/P: Diabetes currently uncontrolled with most recent A1c 8.6 on 7/28, down from 9.3 in April. Patient is able to verbalize appropriate hypoglycemia management plan. Medication adherence appears appropriate. He is tolerating Trulicity well with some tolerable adverse effects per the patient, but is still experiencing multiple episodes of hypoglycemia even with stopping glimepiride. With improving BG on Trulicity, hope to decrease insulin which acts glucose independently to decrease incidence of hypoglycemia. He is willing to continue titrating Trulicity despite adverse effects mentioned above so that we can work to decrease Lantus and consequent hypoglycemic events.  -Continue Trulicity 1.5 mg once weekly to complete 4 weeks total, then increase to Trulicity 3 mg once weekly -Decrease Lantus to 46 units daily now, then decrease further to 44 units daily when he starts Trulicity 3 mg -Continue metformin 1000 mg BID, Jardiance  10 mg daily, and Lantus 48 units daily -Instructed patient to call if he experiences any low blood sugars prior to next visit  -Counseled him to stop eating when he starts to feel full and to try eating smaller meals throughout the  day -Extensively discussed pathophysiology of diabetes, recommended lifestyle interventions, dietary effects on blood sugar control -Counseled on s/sx of and management of hypoglycemia -Next A1C anticipated 04/26/21 at next PCP Visit.   ASCVD risk - secondary prevention in patient with diabetes. Last LDL is controlled at 63 mg/dL (01/24/21). High-intensity statin indicated, however hx LFT elevations with rosuvastatin 40 mg daily, tolerating 10 mg daily and at goal LDL.  -Continue rosuvastatin 10 mg daily  Total time in face to face counseling 30 minutes. Follow up Pharmacist visit in 4 weeks.   Rebbeca Paul, PharmD PGY2 Ambulatory Care Pharmacy Resident 03/06/2021 11:22 AM

## 2021-03-06 ENCOUNTER — Ambulatory Visit: Attending: Internal Medicine | Admitting: Pharmacist

## 2021-03-06 ENCOUNTER — Other Ambulatory Visit: Payer: Self-pay

## 2021-03-06 ENCOUNTER — Encounter: Payer: Self-pay | Admitting: Pharmacist

## 2021-03-06 DIAGNOSIS — E1159 Type 2 diabetes mellitus with other circulatory complications: Secondary | ICD-10-CM | POA: Diagnosis not present

## 2021-03-06 DIAGNOSIS — Z794 Long term (current) use of insulin: Secondary | ICD-10-CM

## 2021-03-06 LAB — GLUCOSE, POCT (MANUAL RESULT ENTRY): POC Glucose: 153 mg/dl — AB (ref 70–99)

## 2021-03-06 MED ORDER — TRULICITY 3 MG/0.5ML ~~LOC~~ SOAJ: 3.0000 mg | SUBCUTANEOUS | 2 refills | Status: DC

## 2021-03-06 MED ORDER — LANTUS SOLOSTAR 100 UNIT/ML ~~LOC~~ SOPN: PEN_INJECTOR | SUBCUTANEOUS | 10 refills | Status: DC

## 2021-04-02 NOTE — Progress Notes (Signed)
S:    PCP: Dr. Wynetta Emery  Patient presents for diabetes evaluation, education, and management. Patient was referred on 01/24/21 by Freeman Caldron, PA-C. Patient was last seen by Primary Care Provider on 09/24/20 via telemedicine visit. At last CPP visit on 8/10, patient reported hypoglycemic events. Increased Trulicity to 1.5 mg weekly and discontinued glimepiride but continued to have hypoglycemic symptoms so increased Trulicity to 3 mg and decreased Lantus to 44 units.   Today, patient reports he has taken increased dose of Trulicity for 2 weeks so far. He has noticed some increased constipation and heartburn that he experiences more on an empty stomach and is relieved when he eats smaller meals throughout the day. Feeling fuller faster and stops eating sooner. He experienced these symptoms with the 1.5 mg dose too and while it is still tolerable, he notices it more since increasing the dose. When this happened before with the 1.5 mg he notes that it did improve after he'd been on the dose for a while. He has been having symptoms of hypoglycemia 2-3 times per week in the mid-morning before he is able to take his break at work to eat a snack, which is an improvement from daily at the last visit.   Insurance coverage/medication affordability: [employer]  Medication adherence reported.   Current diabetes medications include: Trulicity 3 mg once weekly (Mondays), metformin 1000 mg BID, Jardiance 10 mg daily, Lantus 44 units daily Current hypertension medications include: metoprolol succinate 12.5 mg daily (CAD) Current hyperlipidemia medications include: rosuvastatin 10 mg daily  Patient reports symptoms of hypoglycemic events 2-3 times per week.  Patient reported dietary habits:  Notices high when eats any carbs like breads, pasta Eats something sugary most nights of the week Drinks water, tea 1/2 sweet/unsweet Trying to stop eating when he feels full  Patient-reported exercise habits: Works out  at [employer] 4x/week, walks 20 mins per day at work  Patient denies nocturia (nighttime urination).  Patient denies neuropathy (nerve pain). Patient denies visual changes. Patient reports self foot exams.   O:  POCT BG: 135 (ate protein bar with 15g carbs ~4 hours ago)  Lab Results  Component Value Date   HGBA1C 8.6 (A) 01/24/2021   There were no vitals filed for this visit.  Lipid Panel     Component Value Date/Time   CHOL 119 01/24/2021 1021   TRIG 109 01/24/2021 1021   HDL 36 (L) 01/24/2021 1021   CHOLHDL 3.3 01/24/2021 1021   CHOLHDL 4.9 08/12/2016 1704   VLDL 23 08/12/2016 1704   LDLCALC 63 01/24/2021 1021    Home fasting blood sugars: 81, 121, 160, 171, 147, 140, 106, 109  Clinical Atherosclerotic Cardiovascular Disease (ASCVD): Yes  The ASCVD Risk score (Arnett DK, et al., 2019) failed to calculate for the following reasons:   The valid total cholesterol range is 130 to 320 mg/dL   A/P: Diabetes currently uncontrolled with most recent A1c 8.6 on 7/28, down from 9.3 in April. Patient is able to verbalize appropriate hypoglycemia management plan. Medication adherence appears appropriate. He is tolerating Trulicity well with some tolerable adverse effects per the patient. Discussed that the longer he is on Trulicity these adverse effects should improve but if they worsen or become intolerable we can decrease the dose. He would like to continue at the current dose for now. No longer having fasting hypoglycemia and experiencing symptoms of hypoglycemia less at work since decreasing Lantus at last visit. He seems to be very hypoglycemia aware  and can tell when he is dropping and needs to step away at work to eat. Given current control with no fasting hypoglycemia, will continue current medications with plan to decrease Lantus if hypoglycemia returns. Suspect that as he continues on Trulicity, he may need to decrease Lantus further.  -Continue Trulicity 3 mg once weekly  -Continue  Lantus 44 units daily. If any AM fasting BG <70, instructed to decrease Lantus to 42 units. If hypoglycemia returns again, decrease further to 40 units. He confirms understanding.  -Continue metformin 1000 mg BID and Jardiance 10 mg daily -Counseled him to stop eating when he starts to feel full and to try eating smaller meals throughout the day. Try to eat his morning snack at work before he starts to feel low.  -Extensively discussed pathophysiology of diabetes, recommended lifestyle interventions, dietary effects on blood sugar control -Counseled on s/sx of and management of hypoglycemia -Next A1C anticipated 04/26/21 at next PCP visit.   ASCVD risk - secondary prevention in patient with diabetes. Last LDL is controlled at 63 mg/dL (01/24/21). High-intensity statin indicated, however hx LFT elevations with rosuvastatin 40 mg daily, tolerating 10 mg daily and at goal LDL.  -Continue rosuvastatin 10 mg daily  Total time in face to face counseling 25 minutes. Follow up PCP visit on 10/28. Follow up with pharmacy clinic as needed.    Rebbeca Paul, PharmD PGY2 Ambulatory Care Pharmacy Resident 04/03/2021 9:39 AM

## 2021-04-03 ENCOUNTER — Ambulatory Visit: Attending: Internal Medicine | Admitting: Pharmacist

## 2021-04-03 ENCOUNTER — Other Ambulatory Visit: Payer: Self-pay

## 2021-04-03 DIAGNOSIS — E1159 Type 2 diabetes mellitus with other circulatory complications: Secondary | ICD-10-CM | POA: Diagnosis not present

## 2021-04-03 DIAGNOSIS — Z794 Long term (current) use of insulin: Secondary | ICD-10-CM

## 2021-04-03 LAB — GLUCOSE, POCT (MANUAL RESULT ENTRY): POC Glucose: 135 mg/dl — AB (ref 70–99)

## 2021-04-26 ENCOUNTER — Other Ambulatory Visit: Payer: Self-pay

## 2021-04-26 ENCOUNTER — Ambulatory Visit: Attending: Internal Medicine | Admitting: Internal Medicine

## 2021-04-26 VITALS — BP 112/76 | HR 69 | Ht 70.0 in | Wt 208.6 lb

## 2021-04-26 DIAGNOSIS — Z23 Encounter for immunization: Secondary | ICD-10-CM

## 2021-04-26 DIAGNOSIS — E1159 Type 2 diabetes mellitus with other circulatory complications: Secondary | ICD-10-CM

## 2021-04-26 DIAGNOSIS — K219 Gastro-esophageal reflux disease without esophagitis: Secondary | ICD-10-CM

## 2021-04-26 DIAGNOSIS — Z794 Long term (current) use of insulin: Secondary | ICD-10-CM

## 2021-04-26 DIAGNOSIS — I2510 Atherosclerotic heart disease of native coronary artery without angina pectoris: Secondary | ICD-10-CM

## 2021-04-26 LAB — POCT GLYCOSYLATED HEMOGLOBIN (HGB A1C): HbA1c, POC (controlled diabetic range): 8.3 % — AB (ref 0.0–7.0)

## 2021-04-26 MED ORDER — OMEPRAZOLE 20 MG PO CPDR: 20.0000 mg | DELAYED_RELEASE_CAPSULE | Freq: Every day | ORAL | 3 refills | Status: AC | PRN

## 2021-04-26 NOTE — Progress Notes (Signed)
Discuss trulicity medication

## 2021-04-26 NOTE — Progress Notes (Signed)
Patient ID: ELCHANAN BOB, male    DOB: 02/28/1971  MRN: [id number]  CC: Diabetes   Subjective: Paul [name] is a 50 y.o. male who presents for chronic ds management His concerns today include:  Hx of DM, HL, OSA on CPAP, ED (Viagra through [place]), CAD (3 vessle CAD with 80% mid-LAD s/p DES).    DM/overwgh:  Results for orders placed or performed in visit on 04/26/21  POCT glycosylated hemoglobin (Hb A1C)  Result Value Ref Range   Hemoglobin A1C     HbA1c POC (<> result, manual entry)     HbA1c, POC (prediabetic range)     HbA1c, POC (controlled diabetic range) 8.3 (A) 0.0 - 7.0 %  started on Trulicity since last visit with me and has seen the clinical pharmacist several times for monitoring Reports currently taking Lantus 46-48 units daily, Trulicity 3 mg once a wk, Jardiance, Metformin 1gram BID.  Notice heartburn the first day or two after he takes the Trulicity. However, he reports he eats a lot of [place] food that has tomatoes in it. He has noticed decreased appetite with Trulicity.  However he has not had weight loss. Takes all meds 4:45 a.m before going to work.  Lunch is at 11:15 a.m.  Does get ow BS episodes sometimes b/w BF and lunch but not as often lately.  Keeps protein bars or eats a piece of cheese Checks BS in a.m.  Most recent readings: 169, 127, 187, 160, 172, 220, 198, 157 -doing better with eating habits.  "I have a sweet tooth"  and tends to eat something sweet with dinner. -still has not had eye exam.  Agrees to referral.  CAD/HL:  last saw Dr. Radford Pax 06/2020 No CP/SOB.  No use of SL Nitro recently. Compliant with metoprolol, Plavix, aspirin and Crestor Goes to GYM to walk on TM 4 x a wk. denies any chest pains or shortness of exertion.  HM:  due for flu and Prevnar 20 vaccines.  Agrees to get both today.   Patient Active Problem List   Diagnosis Date Noted   Acute rhinitis 07/24/2020   Fatty liver 11/24/2019   CAD (coronary artery disease)  09/08/2019   Angina pectoris ([place])    Erectile dysfunction 08/12/2016   Polyp of colon 08/12/2016   Obstructive sleep apnea 04/03/2015   Onychomycosis of right great toe 04/03/2015   Diabetes type 2, uncontrolled 01/25/2014   Dyslipidemia associated with type 2 diabetes mellitus ([place]) 01/25/2014   Skin tag 01/25/2014     Current Outpatient Medications on File Prior to Visit  Medication Sig Dispense Refill   aspirin EC 81 MG tablet Take 1 tablet (81 mg total) by mouth daily. 90 tablet 3   clopidogrel (PLAVIX) 75 MG tablet Take 1 tablet (75 mg total) by mouth daily. 90 tablet 2   Dulaglutide (TRULICITY) 3 VQ/2.5ZD SOPN Inject 3 mg as directed once a week. 2 mL 2   empagliflozin (JARDIANCE) 10 MG TABS tablet Take 1 tablet (10 mg total) by mouth daily before breakfast. 90 tablet 1   Homeopathic Products (THERAWORX RELIEF EX) Apply 1 application topically daily as needed (muscle cramps / pains).     insulin glargine (LANTUS SOLOSTAR) 100 UNIT/ML Solostar Pen INJECT 46 UNITS UNDER THE SKIN DAILY. Decrease to 44u daily once you start the new Trulicity dose. 15 mL 10   Insulin Pen Needle (B-D UF III MINI PEN NEEDLES) 31G X 5 MM MISC As directed 100 each 2  Insulin Syringe-Needle U-100 (B-D INS SYRINGE 0.5CC/31GX5/16) 31G X 5/16" 0.5 ML MISC 1 each by Does not apply route at bedtime. 30 each 2   metFORMIN (GLUCOPHAGE) 1000 MG tablet Take 1 tablet (1,000 mg total) by mouth 2 (two) times daily with a meal. 180 tablet 1   metoprolol succinate (TOPROL XL) 25 MG 24 hr tablet Take 0.5 tablets (12.5 mg total) by mouth daily. 30 tablet 5   nitroGLYCERIN (NITROSTAT) 0.4 MG SL tablet Place 1 tablet (0.4 mg total) under the tongue every 5 (five) minutes as needed. 25 tablet 2   rosuvastatin (CRESTOR) 10 MG tablet TAKE 1 TABLET(10 MG) BY MOUTH DAILY 30 tablet 7   sildenafil (VIAGRA) 50 MG tablet Take 50 mg by mouth daily as needed for erectile dysfunction.     Turmeric 500 MG CAPS Take 1,000 mg by mouth every  evening.     No current facility-administered medications on file prior to visit.    Allergies  Allergen Reactions   Codeine Nausea And Vomiting and Other (See Comments)    headache   Crestor [Rosuvastatin]     Elveated LFTs with Crestor 40mg  daily     Social History   Socioeconomic History   Marital status: Married    Spouse name: Not on file   Number of children: 2   Years of education: Not on file   Highest education level: Not on file  Occupational History   Occupation: Animal nutritionist  Tobacco Use   Smoking status: Never   Smokeless tobacco: Never  Vaping Use   Vaping Use: Never used  Substance and Sexual Activity   Alcohol use: Yes    Comment: Social   Drug use: No   Sexual activity: Yes  Other Topics Concern   Not on file  Social History Narrative   Not on file   Social Determinants of Health   Financial Resource Strain: Not on file  Food Insecurity: Not on file  Transportation Needs: Not on file  Physical Activity: Not on file  Stress: Not on file  Social Connections: Not on file  Intimate Partner Violence: Not on file    Family History  Problem Relation Age of Onset   Hypertension Father    Diabetes Mother    Sleep apnea Other    Heart attack Maternal Grandfather    Heart attack Paternal Grandfather    Cancer Neg Hx    Colon cancer Neg Hx    Esophageal cancer Neg Hx    Stomach cancer Neg Hx    Rectal cancer Neg Hx     Past Surgical History:  Procedure Laterality Date   COLONOSCOPY     CORONARY STENT INTERVENTION N/A 08/26/2019   Procedure: CORONARY STENT INTERVENTION;  Surgeon: Wellington Hampshire, MD;  Location: [facility];  Service: Cardiovascular;  Laterality: N/A;   KNEE ARTHROSCOPY Right    LEFT HEART CATH AND CORONARY ANGIOGRAPHY N/A 08/26/2019   Procedure: LEFT HEART CATH AND CORONARY ANGIOGRAPHY;  Surgeon: Wellington Hampshire, MD;  Location: [facility];  Service: Cardiovascular;  Laterality: N/A;   UMBILICAL HERNIA  REPAIR      ROS: Review of Systems Negative except as stated above  PHYSICAL EXAM: BP 112/76   Pulse 69   Ht 5\' 10"  (1.778 m)   Wt 208 lb 9.6 oz (94.6 kg)   SpO2 97%   BMI 29.93 kg/m   Wt Readings from Last 3 Encounters:  04/26/21 208 lb 9.6 oz (94.6 kg)  07/05/20 208 lb 9.6 oz (94.6 kg)  06/19/20 210 lb 6.4 oz (95.4 kg)    Physical Exam General appearance - alert, well appearing, and in no distress Mental status - normal mood, behavior, speech, dress, motor activity, and thought processes Neck - supple, no significant adenopathy Chest - clear to auscultation, no wheezes, rales or rhonchi, symmetric air entry Heart - normal rate, regular rhythm, normal S1, S2, no murmurs, rubs, clicks or gallops Extremities - peripheral pulses normal, no pedal edema, no clubbing or cyanosis Diabetic Foot Exam - Simple   Simple Foot Form Visual Inspection See comments: Yes Sensation Testing Pulse Check Posterior Tibialis and Dorsalis pulse intact bilaterally: Yes Comments He has a small callus on the plantar surface of the ball of the left fifth digit.  Toenail of the right big toe is thick and discolored.     CMP Latest Ref Rng & Units 01/24/2021 09/10/2020 06/19/2020  Glucose 65 - 99 mg/dL 64(L) - 281(H)  BUN 6 - 24 mg/dL 16 - 15  Creatinine 0.76 - 1.27 mg/dL 0.88 - 0.81  Sodium 134 - 144 mmol/L 144 - 143  Potassium 3.5 - 5.2 mmol/L 4.2 - 4.2  Chloride 96 - 106 mmol/L 106 - 103  CO2 20 - 29 mmol/L 22 - 24  Calcium 8.7 - 10.2 mg/dL 9.5 - 9.2  Total Protein 6.0 - 8.5 g/dL 6.5 - 6.5  Total Bilirubin 0.0 - 1.2 mg/dL 0.6 - 0.8  Alkaline Phos 44 - 121 IU/L 74 - 68  AST 0 - 40 IU/L 20 - 14  ALT 0 - 44 IU/L 21 21 21    Lipid Panel     Component Value Date/Time   CHOL 119 01/24/2021 1021   TRIG 109 01/24/2021 1021   HDL 36 (L) 01/24/2021 1021   CHOLHDL 3.3 01/24/2021 1021   CHOLHDL 4.9 08/12/2016 1704   VLDL 23 08/12/2016 1704   LDLCALC 63 01/24/2021 1021    CBC    Component  Value Date/Time   WBC 8.1 01/24/2021 1021   WBC 10.3 07/04/2019 0251   RBC 5.17 01/24/2021 1021   RBC 4.89 07/04/2019 0251   HGB 15.5 01/24/2021 1021   HCT 46.1 01/24/2021 1021   PLT 191 01/24/2021 1021   MCV 89 01/24/2021 1021   MCH 30.0 01/24/2021 1021   MCH 30.7 07/04/2019 0251   MCHC 33.6 01/24/2021 1021   MCHC 36.3 (H) 07/04/2019 0251   RDW 12.5 01/24/2021 1021   LYMPHSABS 2.0 01/24/2021 1021   MONOABS 0.6 01/25/2014 1541   EOSABS 0.5 (H) 01/24/2021 1021   BASOSABS 0.1 01/24/2021 1021    ASSESSMENT AND PLAN: 1. Type 2 diabetes mellitus with other circulatory complication, with long-term current use of insulin (HCC) -His A1c is gradually coming down.  Our goal is to get his a.m. fasting blood sugars between 90-130.  I recommend taking the Lantus 48 units most of the times.  We discussed management of hypoglycemic episodes. He is otherwise on metformin, Jardiance and the highest dose of Trulicity.  At this point I would recommend referral to endocrinology to help with management and getting him to goal. -Discussed and encourage healthy eating habits. - POCT glycosylated hemoglobin (Hb A1C) - Ambulatory referral to Endocrinology - Ambulatory referral to Ophthalmology - Microalbumin / creatinine urine ratio  2. Coronary artery disease involving native coronary artery of native heart without angina pectoris Stable.  Continue metoprolol, Plavix, and aspirin and Crestor.  3. Gastroesophageal reflux disease without esophagitis I recommend giving  a trial of him taking omeprazole the day of and the day after his Trulicity shot to see if it would help decrease the acid reflux. GERD precautions discussed.  Advised to avoid certain foods like spicy foods, tomato-based foods, juices and excessive caffeine.  Advised to eat his last meal at least 2 to 3 hours before laying down at nights and to sleep with his head slightly elevated.    - omeprazole (PRILOSEC) 20 MG capsule; Take 1 capsule  (20 mg total) by mouth daily as needed.  Dispense: 30 capsule; Refill: 3  4. Need for influenza vaccination given  5. Need for vaccination against Streptococcus pneumoniae Prevnar 20 given    Patient was given the opportunity to ask questions.  Patient verbalized understanding of the plan and was able to repeat key elements of the plan.   Orders Placed This Encounter  Procedures   Microalbumin / creatinine urine ratio   Ambulatory referral to Endocrinology   Ambulatory referral to Ophthalmology   POCT glycosylated hemoglobin (Hb A1C)     Requested Prescriptions   Signed Prescriptions Disp Refills   omeprazole (PRILOSEC) 20 MG capsule 30 capsule 3    Sig: Take 1 capsule (20 mg total) by mouth daily as needed.    Return in about 4 months (around 08/27/2021).  Paul Plumber, MD, FACP

## 2021-04-26 NOTE — Patient Instructions (Signed)
Gastroesophageal Reflux Disease, Adult Gastroesophageal reflux (GER) happens when acid from the stomach flows up into the tube that connects the mouth and the stomach (esophagus). Normally, food travels down the esophagus and stays in the stomach to be digested. With GER, food and stomach acid sometimes move back up into the esophagus. You may have a disease called gastroesophageal reflux disease (GERD) if the reflux: Happens often. Causes frequent or very bad symptoms. Causes problems such as damage to the esophagus. When this happens, the esophagus becomes sore and swollen. Over time, GERD can make small holes (ulcers) in the lining of the esophagus. What are the causes? This condition is caused by a problem with the muscle between the esophagus and the stomach. When this muscle is weak or not normal, it does not close properly to keep food and acid from coming back up from the stomach. The muscle can be weak because of: Tobacco use. Pregnancy. Having a certain type of hernia (hiatal hernia). Alcohol use. Certain foods and drinks, such as coffee, chocolate, onions, and peppermint. What increases the risk? Being overweight. Having a disease that affects your connective tissue. Taking NSAIDs, such a ibuprofen. What are the signs or symptoms? Heartburn. Difficult or painful swallowing. The feeling of having a lump in the throat. A bitter taste in the mouth. Bad breath. Having a lot of saliva. Having an upset or bloated stomach. Burping. Chest pain. Different conditions can cause chest pain. Make sure you see your doctor if you have chest pain. Shortness of breath or wheezing. A long-term cough or a cough at night. Wearing away of the surface of teeth (tooth enamel). Weight loss. How is this treated? Making changes to your diet. Taking medicine. Having surgery. Treatment will depend on how bad your symptoms are. Follow these instructions at home: Eating and drinking  Follow a  diet as told by your doctor. You may need to avoid foods and drinks such as: Coffee and tea, with or without caffeine. Drinks that contain alcohol. Energy drinks and sports drinks. Bubbly (carbonated) drinks or sodas. Chocolate and cocoa. Peppermint and mint flavorings. Garlic and onions. Horseradish. Spicy and acidic foods. These include peppers, chili powder, curry powder, vinegar, hot sauces, and BBQ sauce. Citrus fruit juices and citrus fruits, such as oranges, lemons, and limes. Tomato-based foods. These include red sauce, chili, salsa, and pizza with red sauce. Fried and fatty foods. These include donuts, french fries, potato chips, and high-fat dressings. High-fat meats. These include hot dogs, rib eye steak, sausage, ham, and bacon. High-fat dairy items, such as whole milk, butter, and cream cheese. Eat small meals often. Avoid eating large meals. Avoid drinking large amounts of liquid with your meals. Avoid eating meals during the 2-3 hours before bedtime. Avoid lying down right after you eat. Do not exercise right after you eat. Lifestyle  Do not smoke or use any products that contain nicotine or tobacco. If you need help quitting, ask your doctor. Try to lower your stress. If you need help doing this, ask your doctor. If you are overweight, lose an amount of weight that is healthy for you. Ask your doctor about a safe weight loss goal. General instructions Pay attention to any changes in your symptoms. Take over-the-counter and prescription medicines only as told by your doctor. Do not take aspirin, ibuprofen, or other NSAIDs unless your doctor says it is okay. Wear loose clothes. Do not wear anything tight around your waist. Raise (elevate) the head of your bed about 6  inches (15 cm). You may need to use a wedge to do this. Avoid bending over if this makes your symptoms worse. Keep all follow-up visits. Contact a doctor if: You have new symptoms. You lose weight and you  do not know why. You have trouble swallowing or it hurts to swallow. You have wheezing or a cough that keeps happening. You have a hoarse voice. Your symptoms do not get better with treatment. Get help right away if: You have sudden pain in your arms, neck, jaw, teeth, or back. You suddenly feel sweaty, dizzy, or light-headed. You have chest pain or shortness of breath. You vomit and the vomit is green, yellow, or black, or it looks like blood or coffee grounds. You faint. Your poop (stool) is red, bloody, or black. You cannot swallow, drink, or eat. These symptoms may represent a serious problem that is an emergency. Do not wait to see if the symptoms will go away. Get medical help right away. Call your local emergency services (911 in the [place] Do not drive yourself to the hospital. Summary If a person has gastroesophageal reflux disease (GERD), food and stomach acid move back up into the esophagus and cause symptoms or problems such as damage to the esophagus. Treatment will depend on how bad your symptoms are. Follow a diet as told by your doctor. Take all medicines only as told by your doctor. This information is not intended to replace advice given to you by your health care provider. Make sure you discuss any questions you have with your health care provider. Document Revised: 12/26/2019 Document Reviewed: 12/26/2019 Elsevier Patient Education  [place].

## 2021-04-29 LAB — MICROALBUMIN / CREATININE URINE RATIO
Creatinine, Urine: 100.7 mg/dL
Microalb/Creat Ratio: 7 mg/g creat (ref 0–29)
Microalbumin, Urine: 7.4 ug/mL

## 2021-05-17 ENCOUNTER — Encounter: Payer: Self-pay | Admitting: Internal Medicine

## 2021-05-17 MED ORDER — TRULICITY 3 MG/0.5ML ~~LOC~~ SOAJ: 3.0000 mg | SUBCUTANEOUS | 6 refills | Status: DC

## 2021-06-02 ENCOUNTER — Encounter: Payer: Self-pay | Admitting: Internal Medicine

## 2021-06-02 DIAGNOSIS — E1165 Type 2 diabetes mellitus with hyperglycemia: Secondary | ICD-10-CM

## 2021-06-02 DIAGNOSIS — Z794 Long term (current) use of insulin: Secondary | ICD-10-CM

## 2021-06-03 MED ORDER — BD PEN NEEDLE MINI U/F 31G X 5 MM MISC: 6 refills | Status: DC

## 2021-07-14 ENCOUNTER — Encounter: Payer: Self-pay | Admitting: Internal Medicine

## 2021-07-19 MED ORDER — TRULICITY 1.5 MG/0.5ML ~~LOC~~ SOAJ: 3.0000 mg | SUBCUTANEOUS | 3 refills | Status: DC

## 2021-07-19 NOTE — Addendum Note (Signed)
Addended by: Karle Plumber B on: 07/19/2021 08:41 AM   Modules accepted: Orders

## 2021-07-25 ENCOUNTER — Telehealth: Payer: Self-pay

## 2021-07-25 NOTE — Telephone Encounter (Signed)
[facility] called and spoke to [place], [place] says information is needed on the medication and he transferred me to [place], [facility]. She says the instructions to give 3 mg needs clarification. She says the 1.5 mg pen doesn't deliver that much. I advised per MyChart message between Dr. Wynetta Emery and the patient she says he is to give 2 shots/week to equal 3 mg. Maudie Mercury, [facility] verbalized understanding and verified it's for a 3 month supply with 3 refills that they received, and I verbalized yes that's what was ordered.

## 2021-07-25 NOTE — Telephone Encounter (Signed)
Copied from [place] [id number]. Topic: Quick Communication - Rx Refill/Question >> Jul 25, 2021  9:58 AM Robina Ade, Helene Kelp D wrote: Medication: Dulaglutide (TRULICITY) 1.5 RF/5.4HK SOPN  Has the patient contacted their pharmacy? Yes.  Call back phone # [phone] ref# [phone], they would like a call back within 24hrs please. (Agent: If no, request that the patient contact the pharmacy for the refill. If patient does not wish to contact the pharmacy document the reason why and proceed with request.) (Agent: If yes, when and what did the pharmacy advise?)  Preferred Pharmacy (with phone number or street name): [facility], [place] Has the patient been seen for an appointment in the last year OR does the patient have an upcoming appointment? Yes.    Agent: Please be advised that RX refills may take up to 3 business days. We ask that you follow-up with your pharmacy.

## 2021-08-29 ENCOUNTER — Encounter: Payer: Self-pay | Admitting: Cardiology

## 2021-08-29 ENCOUNTER — Other Ambulatory Visit: Payer: Self-pay | Admitting: Physician Assistant

## 2021-08-29 DIAGNOSIS — E1159 Type 2 diabetes mellitus with other circulatory complications: Secondary | ICD-10-CM

## 2021-08-29 DIAGNOSIS — I2510 Atherosclerotic heart disease of native coronary artery without angina pectoris: Secondary | ICD-10-CM

## 2021-08-29 DIAGNOSIS — E1169 Type 2 diabetes mellitus with other specified complication: Secondary | ICD-10-CM

## 2021-08-29 MED ORDER — ROSUVASTATIN CALCIUM 10 MG PO TABS: ORAL_TABLET | ORAL | 0 refills | Status: DC

## 2021-08-29 NOTE — Telephone Encounter (Signed)
Requested Prescriptions  ?Pending Prescriptions Disp Refills  ?? JARDIANCE 10 MG TABS tablet [Pharmacy Med Name: JARDIANCE TABS 10MG] 90 tablet 0  ?  Sig: TAKE 1 TABLET DAILY BEFORE BREAKFAST  ?  ? Endocrinology:  Diabetes - SGLT2 Inhibitors Failed - 08/29/2021  1:03 AM  ?  ?  Failed - HBA1C is between 0 and 7.9 and within 180 days  ?  HbA1c, POC (controlled diabetic range)  ?Date Value Ref Range Status  ?04/26/2021 8.3 (A) 0.0 - 7.0 % Final  ?   ?  ?  Passed - Cr in normal range and within 360 days  ?  Creat  ?Date Value Ref Range Status  ?08/12/2016 0.84 0.60 - 1.35 mg/dL Final  ? ?Creatinine, Ser  ?Date Value Ref Range Status  ?01/24/2021 0.88 0.76 - 1.27 mg/dL Final  ? ?Creatinine, Urine  ?Date Value Ref Range Status  ?08/12/2016 61 20 - 370 mg/dL Final  ?   ?  ?  Passed - eGFR in normal range and within 360 days  ?  GFR, Est African American  ?Date Value Ref Range Status  ?08/12/2016 >89 >=60 mL/min Final  ? ?GFR calc Af Amer  ?Date Value Ref Range Status  ?06/19/2020 119 >59 mL/min/1.73 Final  ?  Comment:  ?  **In accordance with recommendations from the NKF-ASN Task force,** ?  [facility] is in the process of updating its eGFR calculation to the ?  2021 CKD-EPI creatinine equation that estimates kidney function ?  without a race variable. ?  ? ?GFR, Est Non African American  ?Date Value Ref Range Status  ?08/12/2016 >89 >=60 mL/min Final  ? ?GFR calc non Af Amer  ?Date Value Ref Range Status  ?06/19/2020 103 >59 mL/min/1.73 Final  ? ?eGFR  ?Date Value Ref Range Status  ?01/24/2021 104 >59 mL/min/1.73 Final  ?   ?  ?  Passed - Valid encounter within last 6 months  ?  Recent Outpatient Visits   ?      ? 4 months ago Type 2 diabetes mellitus with other circulatory complication, with long-term current use of insulin ([place])  ? Paul Ladell Pier, MD  ? 4 months ago Type 2 diabetes mellitus with other circulatory complication, with long-term current use of insulin ([place])  ? [place], RPH-CPP  ? 5 months ago Type 2 diabetes mellitus with other circulatory complication, with long-term current use of insulin ([place])  ? [place], RPH-CPP  ? 6 months ago Type 2 diabetes mellitus with other circulatory complication, with long-term current use of insulin ([place])  ? [place], RPH-CPP  ? 7 months ago Type 2 diabetes mellitus with other circulatory complication, with long-term current use of insulin ([place])  ? [place], [place], [place]  ?  ?  ? ?  ?  ?  ? ? ?

## 2021-09-05 ENCOUNTER — Encounter: Payer: Self-pay | Admitting: Internal Medicine

## 2021-10-03 ENCOUNTER — Encounter: Payer: Self-pay | Admitting: Cardiology

## 2021-10-03 DIAGNOSIS — E1169 Type 2 diabetes mellitus with other specified complication: Secondary | ICD-10-CM

## 2021-10-03 DIAGNOSIS — I2510 Atherosclerotic heart disease of native coronary artery without angina pectoris: Secondary | ICD-10-CM

## 2021-10-04 MED ORDER — ROSUVASTATIN CALCIUM 10 MG PO TABS: ORAL_TABLET | ORAL | 3 refills | Status: DC

## 2021-11-15 ENCOUNTER — Encounter: Payer: Self-pay | Admitting: Internal Medicine

## 2021-11-15 ENCOUNTER — Other Ambulatory Visit: Payer: Self-pay | Admitting: Physician Assistant

## 2021-11-15 DIAGNOSIS — Z794 Long term (current) use of insulin: Secondary | ICD-10-CM

## 2021-11-15 DIAGNOSIS — E1159 Type 2 diabetes mellitus with other circulatory complications: Secondary | ICD-10-CM

## 2021-11-15 NOTE — Telephone Encounter (Signed)
Called pt - LMOM to return call to schedule appointment.

## 2021-11-15 NOTE — Telephone Encounter (Signed)
Courtesy refill. Patient will need to schedule an office visit for further refills. Requested Prescriptions  Pending Prescriptions Disp Refills  . JARDIANCE 10 MG TABS tablet [Pharmacy Med Name: JARDIANCE TABS 10MG] 30 tablet 0    Sig: TAKE 1 TABLET DAILY BEFORE BREAKFAST     Endocrinology:  Diabetes - SGLT2 Inhibitors Failed - 11/15/2021  5:41 AM      Failed - HBA1C is between 0 and 7.9 and within 180 days    HbA1c, POC (controlled diabetic range)  Date Value Ref Range Status  04/26/2021 8.3 (A) 0.0 - 7.0 % Final         Failed - Valid encounter within last 6 months    Recent Outpatient Visits          6 months ago Type 2 diabetes mellitus with other circulatory complication, with long-term current use of insulin ([place])   [place], Neoma Laming B, MD   7 months ago Type 2 diabetes mellitus with other circulatory complication, with long-term current use of insulin ([place])   [place], Annie Main L, RPH-CPP   8 months ago Type 2 diabetes mellitus with other circulatory complication, with long-term current use of insulin [facility])   [place], Annie Main L, RPH-CPP   9 months ago Type 2 diabetes mellitus with other circulatory complication, with long-term current use of insulin [facility])   [place], Annie Main L, RPH-CPP   9 months ago Type 2 diabetes mellitus with other circulatory complication, with long-term current use of insulin [facility])   [place], Dionne Bucy, [place]      Future Appointments            In 1 month Turner, Eber Hong, MD [place], [facility]           Passed - Cr in normal range and within 360 days    Creat  Date Value Ref Range Status  08/12/2016 0.84 0.60 - 1.35 mg/dL Final   Creatinine, Ser  Date Value Ref Range Status  01/24/2021 0.88 0.76 - 1.27  mg/dL Final   Creatinine, Urine  Date Value Ref Range Status  08/12/2016 61 20 - 370 mg/dL Final         Passed - eGFR in normal range and within 360 days    GFR, Est African American  Date Value Ref Range Status  08/12/2016 >89 >=60 mL/min Final   GFR calc Af Amer  Date Value Ref Range Status  06/19/2020 119 >59 mL/min/1.73 Final    Comment:    **In accordance with recommendations from the NKF-ASN Task force,**   [facility] is in the process of updating its eGFR calculation to the   2021 CKD-EPI creatinine equation that estimates kidney function   without a race variable.    GFR, Est Non African American  Date Value Ref Range Status  08/12/2016 >89 >=60 mL/min Final   GFR calc non Af Amer  Date Value Ref Range Status  06/19/2020 103 >59 mL/min/1.73 Final   eGFR  Date Value Ref Range Status  01/24/2021 104 >59 mL/min/1.73 Final

## 2021-11-16 ENCOUNTER — Other Ambulatory Visit: Payer: Self-pay | Admitting: Internal Medicine

## 2021-11-16 DIAGNOSIS — E1159 Type 2 diabetes mellitus with other circulatory complications: Secondary | ICD-10-CM

## 2021-11-16 MED ORDER — EMPAGLIFLOZIN 10 MG PO TABS: 10.0000 mg | ORAL_TABLET | Freq: Every day | ORAL | 1 refills | Status: DC

## 2021-11-16 MED ORDER — TRULICITY 1.5 MG/0.5ML ~~LOC~~ SOAJ: 3.0000 mg | SUBCUTANEOUS | 5 refills | Status: DC

## 2021-11-16 MED ORDER — METFORMIN HCL 1000 MG PO TABS: 1000.0000 mg | ORAL_TABLET | Freq: Two times a day (BID) | ORAL | 1 refills | Status: DC

## 2021-11-20 ENCOUNTER — Other Ambulatory Visit: Payer: Self-pay | Admitting: Internal Medicine

## 2021-11-20 ENCOUNTER — Encounter: Payer: Self-pay | Admitting: Internal Medicine

## 2021-11-20 MED ORDER — TRULICITY 3 MG/0.5ML ~~LOC~~ SOAJ: 3.0000 mg | SUBCUTANEOUS | 1 refills | Status: DC

## 2021-12-02 ENCOUNTER — Encounter: Payer: Self-pay | Admitting: Internal Medicine

## 2021-12-02 ENCOUNTER — Ambulatory Visit (INDEPENDENT_AMBULATORY_CARE_PROVIDER_SITE_OTHER): Admitting: Internal Medicine

## 2021-12-02 VITALS — BP 118/76 | HR 66 | Ht 70.0 in | Wt 211.6 lb

## 2021-12-02 DIAGNOSIS — Z794 Long term (current) use of insulin: Secondary | ICD-10-CM | POA: Diagnosis not present

## 2021-12-02 DIAGNOSIS — E1159 Type 2 diabetes mellitus with other circulatory complications: Secondary | ICD-10-CM | POA: Diagnosis not present

## 2021-12-02 DIAGNOSIS — E785 Hyperlipidemia, unspecified: Secondary | ICD-10-CM

## 2021-12-02 DIAGNOSIS — R739 Hyperglycemia, unspecified: Secondary | ICD-10-CM

## 2021-12-02 DIAGNOSIS — E11319 Type 2 diabetes mellitus with unspecified diabetic retinopathy without macular edema: Secondary | ICD-10-CM

## 2021-12-02 DIAGNOSIS — E1165 Type 2 diabetes mellitus with hyperglycemia: Secondary | ICD-10-CM | POA: Diagnosis not present

## 2021-12-02 LAB — BASIC METABOLIC PANEL
BUN: 21 mg/dL (ref 6–23)
CO2: 29 mEq/L (ref 19–32)
Calcium: 10.1 mg/dL (ref 8.4–10.5)
Chloride: 104 mEq/L (ref 96–112)
Creatinine, Ser: 0.84 mg/dL (ref 0.40–1.50)
GFR: 100.19 mL/min (ref >60.00)
Glucose, Bld: 76 mg/dL (ref 70–99)
Potassium: 4.2 mEq/L (ref 3.5–5.1)
Sodium: 141 mEq/L (ref 135–145)

## 2021-12-02 LAB — POCT GLYCOSYLATED HEMOGLOBIN (HGB A1C): Hemoglobin A1C: 7.9 % — AB (ref 4.0–5.6)

## 2021-12-02 LAB — LIPID PANEL
Cholesterol: 121 mg/dL (ref 0–200)
HDL: 40.9 mg/dL (ref >39.00)
LDL Cholesterol: 58 mg/dL (ref 0–99)
NonHDL: 79.91
Total CHOL/HDL Ratio: 3
Triglycerides: 109 mg/dL (ref 0.0–149.0)
VLDL: 21.8 mg/dL (ref 0.0–40.0)

## 2021-12-02 LAB — POCT GLUCOSE (DEVICE FOR HOME USE): Glucose Fasting, POC: 107 mg/dL — AB (ref 70–99)

## 2021-12-02 LAB — MICROALBUMIN / CREATININE URINE RATIO
Creatinine,U: 80.7 mg/dL
Microalb Creat Ratio: 1 mg/g (ref 0.0–30.0)
Microalb, Ur: 0.8 mg/dL (ref 0.0–1.9)

## 2021-12-02 MED ORDER — LANTUS SOLOSTAR 100 UNIT/ML ~~LOC~~ SOPN: 46.0000 [IU] | PEN_INJECTOR | Freq: Every day | SUBCUTANEOUS | 3 refills | Status: DC

## 2021-12-02 MED ORDER — TRULICITY 4.5 MG/0.5ML ~~LOC~~ SOAJ: 4.5000 mg | SUBCUTANEOUS | 3 refills | Status: DC

## 2021-12-02 MED ORDER — METFORMIN HCL 1000 MG PO TABS: 1000.0000 mg | ORAL_TABLET | Freq: Two times a day (BID) | ORAL | 3 refills | Status: DC

## 2021-12-02 MED ORDER — BD PEN NEEDLE MINI U/F 31G X 5 MM MISC: 1.0000 | Freq: Every day | 3 refills | Status: DC

## 2021-12-02 MED ORDER — DEXCOM G7 SENSOR MISC: 1.0000 | 3 refills | Status: DC

## 2021-12-02 MED ORDER — EMPAGLIFLOZIN 25 MG PO TABS: 25.0000 mg | ORAL_TABLET | Freq: Every day | ORAL | 3 refills | Status: DC

## 2021-12-02 NOTE — Patient Instructions (Signed)
Decrease Lantus to 46 units daily  Increase Trulicity 4.5 mg weekly  Increase Jardiance 25 mg daily  Continue Metformin 1000 mg twice daily      Choose healthy, lower carb lower calorie snacks: toss salad, vegetables, low fat cottage cheese, low fat cheese / string cheese, lower sodium deli meat, tuna salad or chicken salad   HOW TO TREAT LOW BLOOD SUGARS (Blood sugar LESS THAN 70 MG/DL) Please follow the RULE OF 15 for the treatment of hypoglycemia treatment (when your (blood sugars are less than 70 mg/dL)   STEP 1: Take 15 grams of carbohydrates when your blood sugar is low, which includes:  3-4 GLUCOSE TABS  OR 3-4 OZ OF JUICE OR REGULAR SODA OR ONE TUBE OF GLUCOSE GEL    STEP 2: RECHECK blood sugar in 15 MINUTES STEP 3: If your blood sugar is still low at the 15 minute recheck --> then, go back to STEP 1 and treat AGAIN with another 15 grams of carbohydrates.

## 2021-12-02 NOTE — Progress Notes (Unsigned)
Name: Paul [name]  MRN/ DOB: [id number], 08-28-1970   Age/ Sex: 51 y.o., male    PCP: Ladell Pier, MD   Reason for Endocrinology Evaluation: Type 2 Diabetes Mellitus     Date of Initial Endocrinology Visit: 12/02/2021     PATIENT IDENTIFIER: Paul [name] is a 51 y.o. male with a past medical history of DM, CAD, HTN and dyslipidemia, OSA on CPAP . The patient presented for initial endocrinology clinic visit on 12/02/2021 for consultative assistance with his diabetes management.    HPI: Paul [name] was    Diagnosed with DM 2019 Prior Medications tried/Intolerance: n/a Currently checking blood sugars occasionally  Hypoglycemia episodes : yes               Symptoms: yes                 Frequency: rare  Hemoglobin A1c has ranged from 8.3% in 2022, peaking at 9.9% in 2019. Patient required assistance for hypoglycemia: no Patient has required hospitalization within the last 1 year from hyper or hypoglycemia: no  In terms of diet, the patient eats 3 meals a day, drinks a protein powder in the afternoon. Snacks occasionally at night     Denies nausea, vomiting nor diarrhea  Denies pancreatitis    HOME DIABETES REGIMEN: Metformin 4627 mg BID Trulicity 3 mg weekly ( Monday)  Jardiance 10 mg daily  Lantus 52 units daily     Statin: yes ACE-I/ARB: no    METER DOWNLOAD SUMMARY: did not bring     DIABETIC COMPLICATIONS: Microvascular complications:  Retinopathy (mild)  Denies: CKD, neuropathy  Last eye exam: Completed 10/2021  Macrovascular complications:  CAD (S/P PCI) Denies: PVD, CVA   PAST HISTORY: Past Medical History:  Past Medical History:  Diagnosis Date   Diabetes mellitus without complication ([place])    Fatty liver    abnormal CT 2014   Heart disease    Hyperlipidemia    Kidney stones    Sleep apnea    wears CPAP   Past Surgical History:  Past Surgical History:  Procedure Laterality Date   COLONOSCOPY     CORONARY STENT  INTERVENTION N/A 08/26/2019   Procedure: CORONARY STENT INTERVENTION;  Surgeon: Wellington Hampshire, MD;  Location: [place] [facility];  Service: Cardiovascular;  Laterality: N/A;   KNEE ARTHROSCOPY Right    LEFT HEART CATH AND CORONARY ANGIOGRAPHY N/A 08/26/2019   Procedure: LEFT HEART CATH AND CORONARY ANGIOGRAPHY;  Surgeon: Wellington Hampshire, MD;  Location: [place] [facility];  Service: Cardiovascular;  Laterality: N/A;   UMBILICAL HERNIA REPAIR      Social History:  reports that he has never smoked. He has never used smokeless tobacco. He reports current alcohol use. He reports that he does not use drugs. Family History:  Family History  Problem Relation Age of Onset   Hypertension Father    Diabetes Mother    Sleep apnea Other    Heart attack Maternal Grandfather    Heart attack Paternal Grandfather    Cancer Neg Hx    Colon cancer Neg Hx    Esophageal cancer Neg Hx    Stomach cancer Neg Hx    Rectal cancer Neg Hx      HOME MEDICATIONS: Allergies as of 12/02/2021       Reactions   Codeine Nausea And Vomiting, Other (See Comments)   headache   Crestor [rosuvastatin]    Elveated LFTs with Crestor '40mg'$  daily  Hydrocodone Nausea And Vomiting        Medication List        Accurate as of December 02, 2021 12:06 PM. If you have any questions, ask your nurse or doctor.          STOP taking these medications    Insulin Syringe-Needle U-100 31G X 5/16" 0.5 ML Misc Commonly known as: B-D INS SYRINGE 0.5CC/31GX5/16 Stopped by: Dorita Sciara, MD   Trulicity 3 OH/6.0VP Sopn Generic drug: Dulaglutide Replaced by: Trulicity 4.5 XT/0.6YI Sopn Stopped by: Dorita Sciara, MD       TAKE these medications    aspirin EC 81 MG tablet Take 1 tablet (81 mg total) by mouth daily.   B-D UF III MINI PEN NEEDLES 31G X 5 MM Misc Generic drug: Insulin Pen Needle Inject 1 Device into the skin daily in the afternoon. 5 mm 31G 3/15.  Use as directed What changed:  how much  to take how to take this when to take this Changed by: Dorita Sciara, MD   clopidogrel 75 MG tablet Commonly known as: PLAVIX Take 1 tablet (75 mg total) by mouth daily.   Dexcom G7 Sensor Misc 1 Device by Does not apply route as directed. Change every 10 days Started by: Dorita Sciara, MD   empagliflozin 25 MG Tabs tablet Commonly known as: JARDIANCE Take 1 tablet (25 mg total) by mouth daily before breakfast. What changed:  medication strength how much to take Changed by: Dorita Sciara, MD   Lantus SoloStar 100 UNIT/ML Solostar Pen Generic drug: insulin glargine Inject 46 Units into the skin daily. INJECT 46 UNITS UNDER THE SKIN DAILY. Decrease to 44u daily once you start the new Trulicity dose. What changed:  how much to take how to take this when to take this Changed by: Dorita Sciara, MD   metFORMIN 1000 MG tablet Commonly known as: GLUCOPHAGE Take 1 tablet (1,000 mg total) by mouth 2 (two) times daily with a meal.   metoprolol succinate 25 MG 24 hr tablet Commonly known as: Toprol XL Take 0.5 tablets (12.5 mg total) by mouth daily.   nitroGLYCERIN 0.4 MG SL tablet Commonly known as: Nitrostat Place 1 tablet (0.4 mg total) under the tongue every 5 (five) minutes as needed.   omeprazole 20 MG capsule Commonly known as: PRILOSEC Take 1 capsule (20 mg total) by mouth daily as needed.   rosuvastatin 10 MG tablet Commonly known as: CRESTOR TAKE 1 TABLET(10 MG) BY MOUTH DAILY. Please keep upcoming appt with Dr. Radford Pax in July 2023 before anymore refills. Thank you Final attempt   sildenafil 50 MG tablet Commonly known as: VIAGRA Take 50 mg by mouth daily as needed for erectile dysfunction.   THERAWORX RELIEF EX Apply 1 application topically daily as needed (muscle cramps / pains).   Trulicity 4.5 RS/8.5IO Sopn Generic drug: Dulaglutide Inject 4.5 mg into the skin once a week. Replaces: Trulicity 3 EV/0.3JK Sopn Started by:  Dorita Sciara, MD   Turmeric 500 MG Caps Take 1,000 mg by mouth every evening.         ALLERGIES: Allergies  Allergen Reactions   Codeine Nausea And Vomiting and Other (See Comments)    headache   Crestor [Rosuvastatin]     Elveated LFTs with Crestor '40mg'$  daily    Hydrocodone Nausea And Vomiting     REVIEW OF SYSTEMS: A comprehensive ROS was conducted with the patient and is negative except as per HPI  OBJECTIVE:   VITAL SIGNS: BP 118/76 (BP Location: Left Arm, Patient Position: Sitting, Cuff Size: Large)   Pulse 66   Ht '5\' 10"'$  (1.778 m)   Wt 211 lb 9.6 oz (96 kg)   SpO2 95%   BMI 30.36 kg/m    PHYSICAL EXAM:  General: Pt appears well and is in NAD  Neck: General: Supple without adenopathy or carotid bruits. Thyroid: Thyroid size normal.  No goiter or nodules appreciated.   Lungs: Clear with good BS bilat with no rales, rhonchi, or wheezes  Heart: RRR with normal S1 and S2 and no gallops; no murmurs; no rub  Abdomen: Normoactive bowel sounds, soft, nontender, without masses or organomegaly palpable  Extremities:  Lower extremities - No pretibial edema. No lesions.  Neuro: MS is good with appropriate affect, pt is alert and Ox3    DM foot exam: 12/02/2021  The skin of the feet is intact without sores or ulcerations, right great toe nail discoloration  The pedal pulses are 2+ on right and 2+ on left. The sensation is intact to a screening 5.07, 10 gram monofilament bilaterally   DATA REVIEWED:  Lab Results  Component Value Date   HGBA1C 7.9 (A) 12/02/2021   HGBA1C 8.3 (A) 04/26/2021   HGBA1C 8.6 (A) 01/24/2021   Lab Results  Component Value Date   MICROALBUR 0.3 08/12/2016   LDLCALC 63 01/24/2021   CREATININE 0.88 01/24/2021   Lab Results  Component Value Date   MICRALBCREAT 7 04/26/2021    Lab Results  Component Value Date   CHOL 119 01/24/2021   HDL 36 (L) 01/24/2021   LDLCALC 63 01/24/2021   TRIG 109 01/24/2021   CHOLHDL 3.3  01/24/2021        ASSESSMENT / PLAN / RECOMMENDATIONS:   1) Type 2 Diabetes Mellitus, Sub-Optimally controlled, With macrovascular and retinopathic  complications - Most recent A1c of 7.9 %. Goal A1c < 7.0 %.    Plan: GENERAL: ***  MEDICATIONS: ***  EDUCATION / INSTRUCTIONS: BG monitoring instructions: Patient is instructed to check his blood sugars *** times a day, ***. Call [place] [facility] if: BG persistently < 70  I reviewed the Rule of 15 for the treatment of hypoglycemia in detail with the patient. Literature supplied.   2) Diabetic complications:  Eye: Does  have known diabetic retinopathy.  Neuro/ Feet: Does *** have known diabetic peripheral neuropathy. Renal: Patient does not have known baseline CKD. He is on an ACEI/ARB at present.  3) CAD/Dyslipidemia: Per cardiology          Signed electronically by: Mack Guise, MD  [facility]  [facility] [address], [place], [place] [address] Phone: [phone] FAX: [phone]   CC: Ladell Pier, MD [address] [place] [address] Phone: [phone]  Fax: [phone]    Return to Endocrinology clinic as below: Future Appointments  Date Time Provider [place]  12/12/2021  8:50 AM Argentina Donovan, PA-C [facility] None  01/06/2022  9:00 AM Sueanne Margarita, MD [facility]

## 2021-12-03 DIAGNOSIS — E1165 Type 2 diabetes mellitus with hyperglycemia: Secondary | ICD-10-CM | POA: Insufficient documentation

## 2021-12-03 DIAGNOSIS — E119 Type 2 diabetes mellitus without complications: Secondary | ICD-10-CM | POA: Insufficient documentation

## 2021-12-03 DIAGNOSIS — E11319 Type 2 diabetes mellitus with unspecified diabetic retinopathy without macular edema: Secondary | ICD-10-CM | POA: Insufficient documentation

## 2021-12-03 DIAGNOSIS — E785 Hyperlipidemia, unspecified: Secondary | ICD-10-CM | POA: Insufficient documentation

## 2021-12-03 DIAGNOSIS — Z794 Long term (current) use of insulin: Secondary | ICD-10-CM | POA: Insufficient documentation

## 2021-12-10 ENCOUNTER — Other Ambulatory Visit (HOSPITAL_COMMUNITY): Payer: Self-pay

## 2021-12-12 ENCOUNTER — Ambulatory Visit: Attending: Physician Assistant | Admitting: Physician Assistant

## 2021-12-12 ENCOUNTER — Encounter: Payer: Self-pay | Admitting: Physician Assistant

## 2021-12-12 VITALS — BP 123/86 | HR 73 | Wt 213.2 lb

## 2021-12-12 DIAGNOSIS — Z794 Long term (current) use of insulin: Secondary | ICD-10-CM

## 2021-12-12 DIAGNOSIS — E785 Hyperlipidemia, unspecified: Secondary | ICD-10-CM

## 2021-12-12 DIAGNOSIS — E1169 Type 2 diabetes mellitus with other specified complication: Secondary | ICD-10-CM | POA: Diagnosis not present

## 2021-12-12 DIAGNOSIS — I2510 Atherosclerotic heart disease of native coronary artery without angina pectoris: Secondary | ICD-10-CM | POA: Diagnosis not present

## 2021-12-12 DIAGNOSIS — E1165 Type 2 diabetes mellitus with hyperglycemia: Secondary | ICD-10-CM

## 2021-12-12 LAB — GLUCOSE, POCT (MANUAL RESULT ENTRY): POC Glucose: 149 mg/dl — AB (ref 70–99)

## 2021-12-12 MED ORDER — METOPROLOL SUCCINATE ER 25 MG PO TB24: 12.5000 mg | ORAL_TABLET | Freq: Every day | ORAL | 1 refills | Status: DC

## 2021-12-12 MED ORDER — CLOPIDOGREL BISULFATE 75 MG PO TABS: 75.0000 mg | ORAL_TABLET | Freq: Every day | ORAL | 2 refills | Status: DC

## 2021-12-12 MED ORDER — ROSUVASTATIN CALCIUM 10 MG PO TABS: ORAL_TABLET | ORAL | 1 refills | Status: DC

## 2021-12-12 NOTE — Progress Notes (Signed)
[name], male   DOB: [birth year], 51 y.o.   MRN: [id number]   Rajinder Mesick, is a 51 y.o. male  JJH:[id number]  [id number]  DOB - [birth year]  Chief Complaint  Patient presents with   Diabetes       Subjective:   Paul [name] is a 51 y.o. male here today for a few med RF.  Sees cardiology next month.  Has just started seeing endocrine for diabetes. A1C was 7.9 10 days ago.  No CP/SOB/HA/dizziness.  Takes crestor and does fine with it as long as it is dosed at lower doses.  LFT have increased at higher doses.    No problems updated.  ALLERGIES: Allergies  Allergen Reactions   Codeine Nausea And Vomiting and Other (See Comments)    headache   Crestor [Rosuvastatin]     Elveated LFTs with Crestor '40mg'$  daily    Hydrocodone Nausea And Vomiting    PAST MEDICAL HISTORY: Past Medical History:  Diagnosis Date   Diabetes mellitus without complication ([place])    Fatty liver    abnormal CT 2014   Heart disease    Hyperlipidemia    Kidney stones    Sleep apnea    wears CPAP    MEDICATIONS AT HOME: Prior to Admission medications   Medication Sig Start Date End Date Taking? Authorizing Provider  aspirin EC 81 MG tablet Take 1 tablet (81 mg total) by mouth daily. 08/22/19  Yes Turner, Eber Hong, MD  Continuous Blood Gluc Sensor (DEXCOM G7 SENSOR) MISC 1 Device by Does not apply route as directed. Change every 10 days 12/02/21  Yes Shamleffer, Melanie Crazier, MD  Homeopathic Products ([place]) Apply 1 application topically daily as needed (muscle cramps / pains).   Yes [provider]  insulin glargine (LANTUS SOLOSTAR) 100 UNIT/ML Solostar Pen Inject 46 Units into the skin daily. INJECT 46 UNITS UNDER THE SKIN DAILY. Decrease to 44u daily once you start the new Trulicity dose. 12/02/21  Yes Shamleffer, Melanie Crazier, MD  Insulin Pen Needle (B-D UF III MINI PEN NEEDLES) 31G X 5 MM MISC Inject 1 Device into the skin daily in the afternoon. 5  mm 31G 3/15.  Use as directed 12/02/21  Yes Shamleffer, Melanie Crazier, MD  metFORMIN (GLUCOPHAGE) 1000 MG tablet Take 1 tablet (1,000 mg total) by mouth 2 (two) times daily with a meal. 12/02/21  Yes Shamleffer, Melanie Crazier, MD  nitroGLYCERIN (NITROSTAT) 0.4 MG SL tablet Place 1 tablet (0.4 mg total) under the tongue every 5 (five) minutes as needed. 08/26/19  Yes Cheryln Manly, NP  omeprazole (PRILOSEC) 20 MG capsule Take 1 capsule (20 mg total) by mouth daily as needed. 04/26/21  Yes Ladell Pier, MD  sildenafil (VIAGRA) 50 MG tablet Take 50 mg by mouth daily as needed for erectile dysfunction.   Yes [provider]  Turmeric 500 MG CAPS Take 1,000 mg by mouth every evening.   Yes [provider]  clopidogrel (PLAVIX) 75 MG tablet Take 1 tablet (75 mg total) by mouth daily. 12/12/21   Argentina Donovan, PA-C  Dulaglutide (TRULICITY) 4.5 OV/7.8HY SOPN Inject 4.5 mg into the skin once a week. 12/02/21   Shamleffer, Melanie Crazier, MD  empagliflozin (JARDIANCE) 25 MG TABS tablet Take 1 tablet (25 mg total) by mouth daily before breakfast. 12/02/21   Shamleffer, Melanie Crazier, MD  metoprolol succinate (TOPROL XL) 25 MG 24 hr tablet Take 0.5 tablets (12.5 mg total) by mouth daily. 12/12/21  Freeman Caldron M, PA-C  rosuvastatin (CRESTOR) 10 MG tablet TAKE 1 TABLET(10 MG) BY MOUTH DAILY. 12/12/21   Deasha Clendenin, Dionne Bucy, PA-C    ROS: Neg HEENT Neg resp Neg cardiac Neg GI Neg GU Neg MS Neg psych Neg neuro  Objective:   Vitals:   12/12/21 0851  BP: 123/86  Pulse: 73  SpO2: 98%  Weight: 213 lb 3.2 oz (96.7 kg)   Exam General appearance : Awake, alert, not in any distress. Speech Clear.  HEENT: Atraumatic and Normocephalic Neck: Supple, no JVD. No cervical lymphadenopathy.  Chest: Good air entry bilaterally, CTAB.  No rales/rhonchi/wheezing CVS: S1 S2 regular, no murmurs.  Extremities: B/L Lower Ext shows no edema, both legs are warm to touch Neurology: Awake  alert, and oriented X 3, CN II-XII intact, Non focal Skin: No Rash  Data Review Lab Results  Component Value Date   HGBA1C 7.9 (A) 12/02/2021   HGBA1C 8.3 (A) 04/26/2021   HGBA1C 8.6 (A) 01/24/2021    Assessment & Plan   1. Type 2 diabetes mellitus with hyperglycemia, with long-term current use of insulin (HCC) Followed by endocrine-recent dose changes 10 days ago - Glucose (CBG)  2. Coronary artery disease involving native coronary artery of native heart without angina pectoris Keep cardiology follow up 01/06/2022 - rosuvastatin (CRESTOR) 10 MG tablet; TAKE 1 TABLET(10 MG) BY MOUTH DAILY.  Dispense: 90 tablet; Refill: 1 - metoprolol succinate (TOPROL XL) 25 MG 24 hr tablet; Take 0.5 tablets (12.5 mg total) by mouth daily.  Dispense: 90 tablet; Refill: 1 - clopidogrel (PLAVIX) 75 MG tablet; Take 1 tablet (75 mg total) by mouth daily.  Dispense: 90 tablet; Refill: 2  3. Dyslipidemia associated with type 2 diabetes mellitus (HCC) - rosuvastatin (CRESTOR) 10 MG tablet; TAKE 1 TABLET(10 MG) BY MOUTH DAILY.  Dispense: 90 tablet; Refill: 1  BP is good today  Return in about 6 months (around 06/13/2022) for PCP/ chronic conditions.  The patient was given clear instructions to go to ER or return to medical center if symptoms don't improve, worsen or new problems develop. The patient verbalized understanding. The patient was told to call to get lab results if they haven't heard anything in the next week.      Freeman Caldron, PA-C [facility] and [facility], [place]   12/12/2021, 9:07 AM

## 2021-12-12 NOTE — Patient Instructions (Signed)
Keep your upcoming appt with cardiology.

## 2021-12-12 NOTE — Progress Notes (Signed)
123

## 2021-12-24 ENCOUNTER — Telehealth: Payer: Self-pay | Admitting: Pharmacy Technician

## 2021-12-24 ENCOUNTER — Other Ambulatory Visit: Payer: Self-pay | Admitting: Internal Medicine

## 2021-12-24 ENCOUNTER — Other Ambulatory Visit (HOSPITAL_COMMUNITY): Payer: Self-pay

## 2021-12-24 MED ORDER — DEXCOM G6 SENSOR MISC: 1.0000 | 3 refills | Status: DC

## 2021-12-24 MED ORDER — DEXCOM G6 TRANSMITTER MISC
1.0000 | 3 refills | Status: DC
Start: 2021-12-24 — End: 2022-04-14

## 2021-12-24 NOTE — Telephone Encounter (Signed)
Patient Advocate Encounter   Received notification from [facility] & Cover[employer]Meds that prior authorization for Dexcom G7 sensors is required by his/her insurance [employer].   Per [employer] site, G7 isn't covered but G6 and Freestyle Libre 2 are. Would you like to change the pt to either of these?

## 2022-01-05 NOTE — Progress Notes (Unsigned)
Cardiology Office Note:    Date:  01/05/2022   ID:  NUH LIPTON, DOB 1970/12/26, MRN [id number]  PCP:  Ladell Pier, MD  Cardiologist:  Fransico Him, MD    Referring MD: Ladell Pier, MD   No chief complaint on file.   History of Present Illness:    Paul [name] is a 51 y.o. male with a hx of DM, HLD, OSA on CPAP.  Coronary CTA 2021 for CP showed calcium score 80 moderate atherosclerosis of the mid to distal LAD and D1, FFR showed possible flow-limiting lesions in the LAD and D1.   Cardiac catheterization: 08/26/2019 showed significant single vessel disease involving the m/dLAD with PCI/DESx1 to the LAD. Plan for DAPT with ASA/plavix. He had elevated LFTs on Crestor '40mg'$  daily and this was dropped to '10mg'$  daily along with addition of Zetia '10mg'$  daily with resolution of elevated LFTs.  He is here today for followup and is doing well.  He denies any chest pain or pressure, SOB, DOE, PND, orthopnea, LE edema, dizziness, palpitations or syncope. He is compliant with his meds and is tolerating meds with no SE.     Past Medical History:  Diagnosis Date   Diabetes mellitus without complication ([place])    Fatty liver    abnormal CT 2014   Heart disease    Hyperlipidemia    Kidney stones    Sleep apnea    wears CPAP    Past Surgical History:  Procedure Laterality Date   COLONOSCOPY     CORONARY STENT INTERVENTION N/A 08/26/2019   Procedure: CORONARY STENT INTERVENTION;  Surgeon: Wellington Hampshire, MD;  Location: [facility];  Service: Cardiovascular;  Laterality: N/A;   KNEE ARTHROSCOPY Right    LEFT HEART CATH AND CORONARY ANGIOGRAPHY N/A 08/26/2019   Procedure: LEFT HEART CATH AND CORONARY ANGIOGRAPHY;  Surgeon: Wellington Hampshire, MD;  Location: [facility];  Service: Cardiovascular;  Laterality: N/A;   UMBILICAL HERNIA REPAIR      Current Medications: No outpatient medications have been marked as taking for the 01/06/22 encounter (Appointment)  with Sueanne Margarita, MD.     Allergies:   Codeine, Crestor [rosuvastatin], and Hydrocodone   Social History   Socioeconomic History   Marital status: Married    Spouse name: Not on file   Number of children: 2   Years of education: Not on file   Highest education level: Not on file  Occupational History   Occupation: Animal nutritionist  Tobacco Use   Smoking status: Never   Smokeless tobacco: Never  Vaping Use   Vaping Use: Never used  Substance and Sexual Activity   Alcohol use: Yes    Comment: Social   Drug use: No   Sexual activity: Yes  Other Topics Concern   Not on file  Social History Narrative   Not on file   Social Determinants of Health   Financial Resource Strain: Not on file  Food Insecurity: Not on file  Transportation Needs: Not on file  Physical Activity: Not on file  Stress: Not on file  Social Connections: Not on file     Family History: The patient's family history includes Diabetes in his mother; Heart attack in his maternal grandfather and paternal grandfather; Hypertension in his father; Sleep apnea in an other family member. There is no history of Cancer, Colon cancer, Esophageal cancer, Stomach cancer, or Rectal cancer.  ROS:   Please see the history of present illness.  ROS  All other systems reviewed and negative.   EKGs/Labs/Other Studies Reviewed:    The following studies were reviewed today: none  EKG:  EKG is ordered today and demonstrates ***  Recent Labs: 01/24/2021: ALT 21; Hemoglobin 15.5; Platelets 191 12/02/2021: BUN 21; Creatinine, Ser 0.84; Potassium 4.2; Sodium 141   Recent Lipid Panel    Component Value Date/Time   CHOL 121 12/02/2021 1213   CHOL 119 01/24/2021 1021   TRIG 109.0 12/02/2021 1213   HDL 40.90 12/02/2021 1213   HDL 36 (L) 01/24/2021 1021   CHOLHDL 3 12/02/2021 1213   VLDL 21.8 12/02/2021 1213   LDLCALC 58 12/02/2021 1213   LDLCALC 63 01/24/2021 1021    Physical Exam:    VS:  There were no  vitals taken for this visit.    Wt Readings from Last 3 Encounters:  12/12/21 213 lb 3.2 oz (96.7 kg)  12/02/21 211 lb 9.6 oz (96 kg)  04/26/21 208 lb 9.6 oz (94.6 kg)     GEN: Well nourished, well developed in no acute distress HEENT: Normal NECK: No JVD; No carotid bruits LYMPHATICS: No lymphadenopathy CARDIAC:RRR, no murmurs, rubs, gallops RESPIRATORY:  Clear to auscultation without rales, wheezing or rhonchi  ABDOMEN: Soft, non-tender, non-distended MUSCULOSKELETAL:  No edema; No deformity  SKIN: Warm and dry NEUROLOGIC:  Alert and oriented x 3 PSYCHIATRIC:  Normal affect    ASSESSMENT:    No diagnosis found.  PLAN:    In order of problems listed above:  1.  ASCAD -coronary CT calcium score 80 moderate atherosclerosis of the mid to distal LAD and D1, FFR showed possible flow-limiting lesions in the LAD and D1. -cath 08/26/2019 showing Significant single vessel disease involving the m/dLAD with PCI/DESx1 to the LAD -he denies any anginal sx since I saw him last -continue prescription drug management with ASA '81mg'$  daily, Plavix 75g daily, Toprol XL 12.'5mg'$  daily and Crestor with PRN refills  2.  HLD -LDL goal < 70 -I have personally reviewed and interpreted outside labs performed by patient's PCP which showed ***  -Continue prescription drug management with Zetia '10mg'$  daily and Crestor '10mg'$  daily with PRN refills Continue Zetia '10mg'$  and Crestor '10mg'$  daily  -he had elevated LFTs with higher doses of Crestor  3.  DM2 -followed by PCP -continue Januvia '100mg'$  daily, Metformin '1000mg'$  BID, glimepiride '4mg'$  daily and Jardiance '10mg'$  daily  Medication Adjustments/Labs and Tests Ordered: Current medicines are reviewed at length with the patient today.  Concerns regarding medicines are outlined above.  No orders of the defined types were placed in this encounter.  No orders of the defined types were placed in this encounter.   Signed, Fransico Him, MD  01/05/2022 8:41 PM     [place]

## 2022-01-06 ENCOUNTER — Encounter: Payer: Self-pay | Admitting: Cardiology

## 2022-01-06 ENCOUNTER — Ambulatory Visit (INDEPENDENT_AMBULATORY_CARE_PROVIDER_SITE_OTHER): Admitting: Cardiology

## 2022-01-06 VITALS — BP 102/88 | HR 61 | Ht 70.5 in | Wt 205.0 lb

## 2022-01-06 DIAGNOSIS — E1169 Type 2 diabetes mellitus with other specified complication: Secondary | ICD-10-CM

## 2022-01-06 DIAGNOSIS — I2510 Atherosclerotic heart disease of native coronary artery without angina pectoris: Secondary | ICD-10-CM

## 2022-01-06 DIAGNOSIS — R072 Precordial pain: Secondary | ICD-10-CM | POA: Diagnosis not present

## 2022-01-06 DIAGNOSIS — E119 Type 2 diabetes mellitus without complications: Secondary | ICD-10-CM

## 2022-01-06 DIAGNOSIS — E785 Hyperlipidemia, unspecified: Secondary | ICD-10-CM

## 2022-01-06 LAB — ALT: ALT: 37 IU/L (ref 0–44)

## 2022-01-06 NOTE — Patient Instructions (Addendum)
Medication Instructions:  Your physician recommends that you continue on your current medications as directed. Please refer to the Current Medication list given to you today.  *If you need a refill on your cardiac medications before your next appointment, please call your pharmacy*  Lab Work: TODAY: ALT If you have labs (blood work) drawn today and your tests are completely normal, you will receive your results only by: [place] (if you have MyChart) OR A paper copy in the mail If you have any lab test that is abnormal or we need to change your treatment, we will call you to review the results.  Testing/Procedures: Your provider has recommended that you have a stress PET/CT scan. Please see below for further instructions.   Follow-Up: At [facility], you and your health needs are our priority.  As part of our continuing mission to provide you with exceptional heart care, we have created designated Provider Care Teams.  These Care Teams include your primary Cardiologist (physician) and Advanced Practice Providers (APPs -  Physician Assistants and Nurse Practitioners) who all work together to provide you with the care you need, when you need it.  Your next appointment:   1 year(s)  The format for your next appointment:   In Person  Provider:   Fransico Him, MD    Other Instructions How to Prepare for Your Cardiac PET/CT Stress Test:  1. Please do not take these medications before your test:   Medications that may interfere with the cardiac pharmacological stress agent (ex. nitrates - including erectile dysfunction medications or beta-blockers) the day of the exam. (Erectile dysfunction medication should be held for at least 72 hrs prior to test) Theophylline containing medications for 12 hours. Dipyridamole 48 hours prior to the test. Your remaining medications may be taken with water.  2. Nothing to eat or drink, except water, 3 hours prior to arrival time.   NO  caffeine/decaffeinated products, or chocolate 12 hours prior to arrival.  3. NO perfume, cologne or lotion  4. Total time is 1 to 2 hours; you may want to bring reading material for the waiting time.  5. Please report to Admitting at the [facility] Main Entrance 60 minutes early for your test.  [place], [place] [id number]  Diabetic Preparation:  Hold oral medications. You may take NPH and Lantus insulin. Do not take Humalog or Humulin R (Regular Insulin) the day of your test. Check blood sugars prior to leaving the house. If able to eat breakfast prior to 3 hour fasting, you may take all medications, including your insulin, Do not worry if you miss your breakfast dose of insulin - start at your next meal.  In preparation for your appointment, medication and supplies will be purchased.  Appointment availability is limited, so if you need to cancel or reschedule, please call the Radiology Department at [phone]  24 hours in advance to avoid a cancellation fee of $100.00  What to Expect After you Arrive:  Once you arrive and check in for your appointment, you will be taken to a preparation room within the Radiology Department.  A technologist or Nurse will obtain your medical history, verify that you are correctly prepped for the exam, and explain the procedure.  Afterwards,  an IV will be started in your arm and electrodes will be placed on your skin for EKG monitoring during the stress portion of the exam. Then you will be escorted to the PET/CT scanner.  There,  staff will get you positioned on the scanner and obtain a blood pressure and EKG.  During the exam, you will continue to be connected to the EKG and blood pressure machines.  A small, safe amount of a radioactive tracer will be injected in your IV to obtain a series of pictures of your heart along with an injection of a stress agent.    After your Exam:  It is recommended that you eat a meal and drink a  caffeinated beverage to counter act any effects of the stress agent.  Drink plenty of fluids for the remainder of the day and urinate frequently for the first couple of hours after the exam.  Your doctor will inform you of your test results within 7-10 business days.  For questions about your test or how to prepare for your test, please call: Marchia Bond, Cardiac Imaging Nurse Navigator  Gordy Clement, Cardiac Imaging Nurse Navigator Office: [phone]   Important Information About Sugar

## 2022-01-06 NOTE — Addendum Note (Signed)
Addended by: Antonieta Iba on: 01/06/2022 09:27 AM   Modules accepted: Orders

## 2022-01-08 ENCOUNTER — Other Ambulatory Visit: Payer: Self-pay

## 2022-01-08 MED ORDER — DEXCOM G6 SENSOR MISC: 1.0000 | 3 refills | Status: DC

## 2022-01-10 ENCOUNTER — Telehealth: Admitting: Family Medicine

## 2022-01-10 ENCOUNTER — Ambulatory Visit: Payer: Self-pay | Admitting: *Deleted

## 2022-01-10 DIAGNOSIS — J069 Acute upper respiratory infection, unspecified: Secondary | ICD-10-CM

## 2022-01-10 DIAGNOSIS — U071 COVID-19: Secondary | ICD-10-CM | POA: Diagnosis not present

## 2022-01-10 MED ORDER — AZITHROMYCIN 250 MG PO TABS: ORAL_TABLET | ORAL | 0 refills | Status: AC

## 2022-01-10 MED ORDER — PROMETHAZINE-DM 6.25-15 MG/5ML PO SYRP: 5.0000 mL | ORAL_SOLUTION | Freq: Four times a day (QID) | ORAL | 0 refills | Status: DC | PRN

## 2022-01-10 NOTE — Telephone Encounter (Signed)
  Chief Complaint: covid positive home test Symptoms: cough, yellow green phlegm Frequency: na Pertinent Negatives: Patient denies sob Disposition: [] ED /[] Urgent Care (no appt availability in office) / [] Appointment(In office/virtual)/ [x]  [facility]/ [] Home Care/ [] Refused Recommended Disposition /[] [facility]/ []  Follow-up with PCP Additional Notes: Positive Covid, symptoms 3 days, states has yellow/green sputum this morning. No visit available in person or virtually with [facility]. Pt states if sputum does not clear up he will do a MyChart virtual visit. Home care information given also.   Reason for Disposition  [1] COVID-19 diagnosed by positive lab test (e.g., PCR, rapid self-test kit) AND [2] mild symptoms (e.g., cough, fever, others) AND [3] no complications or SOB  Answer Assessment - Initial Assessment Questions 1. COVID-19 DIAGNOSIS: "Who made your COVID-19 diagnosis?" "Was it confirmed by a positive lab test or self-test?" If not diagnosed by a doctor (or NP/PA), ask "Are there lots of cases (community spread) where you live?" Note: See public health department website, if unsure.     Home test 2. COVID-19 EXPOSURE: "Was there any known exposure to COVID before the symptoms began?" CDC Definition of close contact: within 6 feet (2 meters) for a total of 15 minutes or more over a 24-hour period.      No idea 3. ONSET: "When did the COVID-19 symptoms start?"      Tuesday (3 days ago) 4. WORST SYMPTOM: "What is your worst symptom?" (e.g., cough, fever, shortness of breath, muscle aches)     Sore throat, muscle aches, fever 5. COUGH: "Do you have a cough?" If Yes, ask: "How bad is the cough?"       Yes, phlegm yellow green. 6. FEVER: "Do you have a fever?" If Yes, ask: "What is your temperature, how was it measured, and when did it start?"     Yes, highest temp 101.3 7. RESPIRATORY STATUS: "Describe your breathing?" (e.g., shortness of breath, wheezing, unable to  speak)      No breathing  8. BETTER-SAME-WORSE: "Are you getting better, staying the same or getting worse compared to yesterday?"  If getting worse, ask, "In what way?"     same 9. HIGH RISK DISEASE: "Do you have any chronic medical problems?" (e.g., asthma, heart or lung disease, weak immune system, obesity, etc.)     Heart stent, cholesterol med, diabetes 10. VACCINE: "Have you had the COVID-19 vaccine?" If Yes, ask: "Which one, how many shots, when did you get it?"       Vaccine the first two and a booster 11. BOOSTER: "Have you received your COVID-19 booster?" If Yes, ask: "Which one and when did you get it?"       yes 12. PREGNANCY: "Is there any chance you are pregnant?" "When was your last menstrual period?"       na 13. OTHER SYMPTOMS: "Do you have any other symptoms?"  (e.g., chills, fatigue, headache, loss of smell or taste, muscle pain, sore throat)       chills 14. O2 SATURATION MONITOR:  "Do you use an oxygen saturation monitor (pulse oximeter) at home?" If Yes, ask "What is your reading (oxygen level) today?" "What is your usual oxygen saturation reading?" (e.g., 95%)       no  Protocols used: Coronavirus (COVID-19) Diagnosed or Suspected-A-AH

## 2022-01-10 NOTE — Progress Notes (Signed)
Virtual Visit Consent   Paul [name], you are scheduled for a virtual visit with a [facility] provider today. Just as with appointments in the office, your consent must be obtained to participate. Your consent will be active for this visit and any virtual visit you may have with one of our providers in the next 365 days. If you have a MyChart account, a copy of this consent can be sent to you electronically.  As this is a virtual visit, video technology does not allow for your provider to perform a traditional examination. This may limit your provider's ability to fully assess your condition. If your provider identifies any concerns that need to be evaluated in person or the need to arrange testing (such as labs, EKG, etc.), we will make arrangements to do so. Although advances in technology are sophisticated, we cannot ensure that it will always work on either your end or our end. If the connection with a video visit is poor, the visit may have to be switched to a telephone visit. With either a video or telephone visit, we are not always able to ensure that we have a secure connection.  By engaging in this virtual visit, you consent to the provision of healthcare and authorize for your insurance to be billed (if applicable) for the services provided during this visit. Depending on your insurance coverage, you may receive a charge related to this service.  I need to obtain your verbal consent now. Are you willing to proceed with your visit today? Paul [name] has provided verbal consent on 01/10/2022 for a virtual visit (video or telephone). Dellia Nims, FNP  Date: 01/10/2022 12:18 PM  Virtual Visit via Video Note   I, Dellia Nims, connected with  Paul [name]  ([id number], [birth year]) on 01/10/22 at 12:15 PM EDT by a video-enabled telemedicine application and verified that I am speaking with the correct person using two identifiers.  Location: Patient: Virtual Visit Location Patient:  Home Provider: Virtual Visit Location Provider: Home Office   I discussed the limitations of evaluation and management by telemedicine and the availability of in person appointments. The patient expressed understanding and agreed to proceed.    History of Present Illness: Paul [name] is a 51 y.o. who identifies as a male who was assigned male at birth, and is being seen today for cough, congestion with brown and green mucus developing. No fever. He has sinus pressure. He was dx with covid Tuesday. Sx started last week. He also has uncontrolled diabetes. He is in no distress. The cough is keeping him up at night. He says he spoke with another office today and they told him he may need an antibiotic since this has been going on a while and mucus is brown and green.  HPI: HPI  Problems:  Patient Active Problem List   Diagnosis Date Noted   Diabetes mellitus ([place]) 12/03/2021   Dyslipidemia 12/03/2021   Type 2 diabetes mellitus with hyperglycemia, with long-term current use of insulin ([place]) 12/03/2021   Type 2 diabetes mellitus with retinopathy, with long-term current use of insulin ([place]) 12/03/2021   Acute rhinitis 07/24/2020   Fatty liver 11/24/2019   CAD (coronary artery disease) 09/08/2019   Angina pectoris ([place])    Erectile dysfunction 08/12/2016   Polyp of colon 08/12/2016   Obstructive sleep apnea 04/03/2015   Onychomycosis of right great toe 04/03/2015   Diabetes type 2, uncontrolled 01/25/2014   Dyslipidemia associated with type 2 diabetes mellitus ([place])  01/25/2014   Skin tag 01/25/2014    Allergies:  Allergies  Allergen Reactions   Codeine Nausea And Vomiting and Other (See Comments)    headache   Crestor [Rosuvastatin]     Elveated LFTs with Crestor '40mg'$  daily    Hydrocodone Nausea And Vomiting   Oxycodone Hcl Other (See Comments)    Headaches   Medications:  Current Outpatient Medications:    azithromycin (ZITHROMAX) 250 MG tablet, Take 2 tablets on day 1, then  1 tablet daily on days 2 through 5, Disp: 6 tablet, Rfl: 0   promethazine-dextromethorphan (PROMETHAZINE-DM) 6.25-15 MG/5ML syrup, Take 5 mLs by mouth 4 (four) times daily as needed for cough., Disp: 118 mL, Rfl: 0   aspirin EC 81 MG tablet, Take 1 tablet (81 mg total) by mouth daily., Disp: 90 tablet, Rfl: 3   clopidogrel (PLAVIX) 75 MG tablet, Take 1 tablet (75 mg total) by mouth daily., Disp: 90 tablet, Rfl: 2   Continuous Blood Gluc Sensor (DEXCOM G6 SENSOR) MISC, 1 Device by Does not apply route as directed., Disp: 9 each, Rfl: 3   Continuous Blood Gluc Transmit (DEXCOM G6 TRANSMITTER) MISC, 1 Device by Does not apply route as directed., Disp: 1 each, Rfl: 3   Dulaglutide (TRULICITY) 4.5 TM/1.9QQ SOPN, Inject 4.5 mg into the skin once a week., Disp: 6 mL, Rfl: 3   empagliflozin (JARDIANCE) 25 MG TABS tablet, Take 1 tablet (25 mg total) by mouth daily before breakfast., Disp: 90 tablet, Rfl: 3   Homeopathic Products (THERAWORX RELIEF EX), Apply 1 application topically daily as needed (muscle cramps / pains)., Disp: , Rfl:    insulin glargine (LANTUS SOLOSTAR) 100 UNIT/ML Solostar Pen, Inject 46 Units into the skin daily. INJECT 46 UNITS UNDER THE SKIN DAILY. Decrease to 44u daily once you start the new Trulicity dose., Disp: 45 mL, Rfl: 3   Insulin Pen Needle (B-D UF III MINI PEN NEEDLES) 31G X 5 MM MISC, Inject 1 Device into the skin daily in the afternoon. 5 mm 31G 3/15.  Use as directed, Disp: 100 each, Rfl: 3   metFORMIN (GLUCOPHAGE) 1000 MG tablet, Take 1 tablet (1,000 mg total) by mouth 2 (two) times daily with a meal., Disp: 180 tablet, Rfl: 3   metoprolol succinate (TOPROL XL) 25 MG 24 hr tablet, Take 0.5 tablets (12.5 mg total) by mouth daily., Disp: 90 tablet, Rfl: 1   nitroGLYCERIN (NITROSTAT) 0.4 MG SL tablet, Place 1 tablet (0.4 mg total) under the tongue every 5 (five) minutes as needed., Disp: 25 tablet, Rfl: 2   omeprazole (PRILOSEC) 20 MG capsule, Take 1 capsule (20 mg total) by  mouth daily as needed., Disp: 30 capsule, Rfl: 3   rosuvastatin (CRESTOR) 10 MG tablet, TAKE 1 TABLET(10 MG) BY MOUTH DAILY., Disp: 90 tablet, Rfl: 1   sildenafil (VIAGRA) 50 MG tablet, Take 50 mg by mouth daily as needed for erectile dysfunction., Disp: , Rfl:    Turmeric 500 MG CAPS, Take 1,000 mg by mouth every evening., Disp: , Rfl:   Observations/Objective: Patient is well-developed, well-nourished in no acute distress.  Resting comfortably  at home.  Head is normocephalic, atraumatic.  No labored breathing.  Speech is clear and coherent with logical content.  Patient is alert and oriented at baseline.    Assessment and Plan: 1. COVID  2. Upper respiratory tract infection, unspecified type  Increase fluids, he is out of the window for paxlovid, mvi with vit d and zinc, proceed to ED for resp  difficulties.   Follow Up Instructions: I discussed the assessment and treatment plan with the patient. The patient was provided an opportunity to ask questions and all were answered. The patient agreed with the plan and demonstrated an understanding of the instructions.  A copy of instructions were sent to the patient via MyChart unless otherwise noted below.     The patient was advised to call back or seek an in-person evaluation if the symptoms worsen or if the condition fails to improve as anticipated.  Time:  I spent 10 minutes with the patient via telehealth technology discussing the above problems/concerns.    Dellia Nims, FNP

## 2022-01-10 NOTE — Patient Instructions (Signed)
Upper Respiratory Infection, Adult An upper respiratory infection (URI) is a common viral infection of the nose, throat, and upper air passages that lead to the lungs. The most common type of URI is the common cold. URIs usually get better on their own, without medical treatment. What are the causes? A URI is caused by a virus. You may catch a virus by: Breathing in droplets from an infected person's cough or sneeze. Touching something that has been exposed to the virus (is contaminated) and then touching your mouth, nose, or eyes. What increases the risk? You are more likely to get a URI if: You are very young or very old. You have close contact with others, such as at work, school, or a health care facility. You smoke. You have long-term (chronic) heart or lung disease. You have a weakened disease-fighting system (immune system). You have nasal allergies or asthma. You are experiencing a lot of stress. You have poor nutrition. What are the signs or symptoms? A URI usually involves some of the following symptoms: Runny or stuffy (congested) nose. Cough. Sneezing. Sore throat. Headache. Fatigue. Fever. Loss of appetite. Pain in your forehead, behind your eyes, and over your cheekbones (sinus pain). Muscle aches. Redness or irritation of the eyes. Pressure in the ears or face. How is this diagnosed? This condition may be diagnosed based on your medical history and symptoms, and a physical exam. Your health care provider may use a swab to take a mucus sample from your nose (nasal swab). This sample can be tested to determine what virus is causing the illness. How is this treated? URIs usually get better on their own within 7-10 days. Medicines cannot cure URIs, but your health care provider may recommend certain medicines to help relieve symptoms, such as: Over-the-counter cold medicines. Cough suppressants. Coughing is a type of defense against infection that helps to clear the  respiratory system, so take these medicines only as recommended by your health care provider. Fever-reducing medicines. Follow these instructions at home: Activity Rest as needed. If you have a fever, stay home from work or school until your fever is gone or until your health care provider says your URI cannot spread to other people (is no longer contagious). Your health care provider may have you wear a face mask to prevent your infection from spreading. Relieving symptoms Gargle with a mixture of salt and water 3-4 times a day or as needed. To make salt water, completely dissolve -1 tsp (3-6 g) of salt in 1 cup (237 mL) of warm water. Use a cool-mist humidifier to add moisture to the air. This can help you breathe more easily. Eating and drinking  Drink enough fluid to keep your urine pale yellow. Eat soups and other clear broths. General instructions  Take over-the-counter and prescription medicines only as told by your health care provider. These include cold medicines, fever reducers, and cough suppressants. Do not use any products that contain nicotine or tobacco. These products include cigarettes, chewing tobacco, and vaping devices, such as e-cigarettes. If you need help quitting, ask your health care provider. Stay away from secondhand smoke. Stay up to date on all immunizations, including the yearly (annual) flu vaccine. Keep all follow-up visits. This is important. How to prevent the spread of infection to others URIs can be contagious. To prevent the infection from spreading: Wash your hands with soap and water for at least 20 seconds. If soap and water are not available, use hand sanitizer. Avoid touching your mouth,   face, eyes, or nose. Cough or sneeze into a tissue or your sleeve or elbow instead of into your hand or into the air.  Contact a health care provider if: You are getting worse instead of better. You have a fever or chills. Your mucus is brown or red. You have  yellow or brown discharge coming from your nose. You have pain in your face, especially when you bend forward. You have swollen neck glands. You have pain while swallowing. You have white areas in the back of your throat. Get help right away if: You have shortness of breath that gets worse. You have severe or persistent: Headache. Ear pain. Sinus pain. Chest pain. You have chronic lung disease along with any of the following: Making high-pitched whistling sounds when you breathe, most often when you breathe out (wheezing). Prolonged cough (more than 14 days). Coughing up blood. A change in your usual mucus. You have a stiff neck. You have changes in your: Vision. Hearing. Thinking. Mood. These symptoms may be an emergency. Get help right away. Call 911. Do not wait to see if the symptoms will go away. Do not drive yourself to the hospital. Summary An upper respiratory infection (URI) is a common infection of the nose, throat, and upper air passages that lead to the lungs. A URI is caused by a virus. URIs usually get better on their own within 7-10 days. Medicines cannot cure URIs, but your health care provider may recommend certain medicines to help relieve symptoms. This information is not intended to replace advice given to you by your health care provider. Make sure you discuss any questions you have with your health care provider. Document Revised: 01/16/2021 Document Reviewed: 01/16/2021 Elsevier Patient Education  [place]: Paul [name] and Audiological scientist If you were exposed Quarantine and stay away from others when you have been in close contact with someone who has COVID-19. Isolate If you are sick or test positive Isolate when you are sick or when you have COVID-19, even if you don't have symptoms. When to stay home Calculating quarantine The date of your exposure is considered day 0. Day 1 is the first full day after your last contact with  a person who has had COVID-19. Stay home and away from other people for at least 5 days. Learn why CDC updated guidance for the general public. IF YOU were exposed to COVID-19 and are NOT  up to dateIF YOU were exposed to COVID-19 and are NOT on COVID-19 vaccinations Quarantine for at least 5 days Stay home Stay home and quarantine for at least 5 full days. Wear a well-fitting mask if you must be around others in your home. Do not travel. Get tested Even if you don't develop symptoms, get tested at least 5 days after you last had close contact with someone with COVID-19. After quarantine Watch for symptoms Watch for symptoms until 10 days after you last had close contact with someone with COVID-19. Avoid travel It is best to avoid travel until a full 10 days after you last had close contact with someone with COVID-19. If you develop symptoms Isolate immediately and get tested. Continue to stay home until you know the results. Wear a well-fitting mask around others. Take precautions until day 10 Wear a well-fitting mask Wear a well-fitting mask for 10 full days any time you are around others inside your home or in public. Do not go to places where you are unable to wear a well-fitting mask. If  you must travel during days 6-10, take precautions. Avoid being around people who are more likely to get very sick from COVID-19. IF YOU were exposed to COVID-19 and are  up to dateIF YOU were exposed to COVID-19 and are on COVID-19 vaccinations No quarantine You do not need to stay home unless you develop symptoms. Get tested Even if you don't develop symptoms, get tested at least 5 days after you last had close contact with someone with COVID-19. Watch for symptoms Watch for symptoms until 10 days after you last had close contact with someone with COVID-19. If you develop symptoms Isolate immediately and get tested. Continue to stay home until you know the results. Wear a well-fitting mask  around others. Take precautions until day 10 Wear a well-fitting mask Wear a well-fitting mask for 10 full days any time you are around others inside your home or in public. Do not go to places where you are unable to wear a well-fitting mask. Take precautions if traveling Avoid being around people who are more likely to get very sick from COVID-19. IF YOU were exposed to COVID-19 and had confirmed COVID-19 within the past 90 days (you tested positive using a viral test) No quarantine You do not need to stay home unless you develop symptoms. Watch for symptoms Watch for symptoms until 10 days after you last had close contact with someone with COVID-19. If you develop symptoms Isolate immediately and get tested. Continue to stay home until you know the results. Wear a well-fitting mask around others. Take precautions until day 10 Wear a well-fitting mask Wear a well-fitting mask for 10 full days any time you are around others inside your home or in public. Do not go to places where you are unable to wear a well-fitting mask. Take precautions if traveling Avoid being around people who are more likely to get very sick from COVID-19. Calculating isolation Day 0 is your first day of symptoms or a positive viral test. Day 1 is the first full day after your symptoms developed or your test specimen was collected. If you have COVID-19 or have symptoms, isolate for at least 5 days. IF YOU tested positive for COVID-19 or have symptoms, regardless of vaccination status Stay home for at least 5 days Stay home for 5 days and isolate from others in your home. Wear a well-fitting mask if you must be around others in your home. Do not travel. Ending isolation if you had symptoms End isolation after 5 full days if you are fever-free for 24 hours (without the use of fever-reducing medication) and your symptoms are improving. Ending isolation if you did NOT have symptoms End isolation after at least 5 full  days after your positive test. If you got very sick from COVID-19 or have a weakened immune system You should isolate for at least 10 days. Consult your doctor before ending isolation. Take precautions until day 10 Wear a well-fitting mask Wear a well-fitting mask for 10 full days any time you are around others inside your home or in public. Do not go to places where you are unable to wear a well-fitting mask. Do not travel Do not travel until a full 10 days after your symptoms started or the date your positive test was taken if you had no symptoms. Avoid being around people who are more likely to get very sick from COVID-19. Definitions Exposure Contact with someone infected with SARS-CoV-2, the virus that causes COVID-19, in a way that increases the  likelihood of getting infected with the virus. Close contact A close contact is someone who was less than 6 feet away from an infected person (laboratory-confirmed or a clinical diagnosis) for a cumulative total of 15 minutes or more over a 24-hour period. For example, three individual 5-minute exposures for a total of 15 minutes. People who are exposed to someone with COVID-19 after they completed at least 5 days of isolation are not considered close contacts. Paul [name] is a strategy used to prevent transmission of COVID-19 by keeping people who have been in close contact with someone with COVID-19 apart from others. Who does not need to quarantine? If you had close contact with someone with COVID-19 and you are in one of the following groups, you do not need to quarantine. You are up to date with your COVID-19 vaccines. You had confirmed COVID-19 within the last 90 days (meaning you tested positive using a viral test). If you are up to date with COVID-19 vaccines, you should wear a well-fitting mask around others for 10 days from the date of your last close contact with someone with COVID-19 (the date of last close contact is  considered day 0). Get tested at least 5 days after you last had close contact with someone with COVID-19. If you test positive or develop COVID-19 symptoms, isolate from other people and follow recommendations in the Isolation section below. If you tested positive for COVID-19 with a viral test within the previous 90 days and subsequently recovered and remain without COVID-19 symptoms, you do not need to quarantine or get tested after close contact. You should wear a well-fitting mask around others for 10 days from the date of your last close contact with someone with COVID-19 (the date of last close contact is considered day 0). If you have COVID-19 symptoms, get tested and isolate from other people and follow recommendations in the Isolation section below. Who should quarantine? If you come into close contact with someone with COVID-19, you should quarantine if you are not up to date on COVID-19 vaccines. This includes people who are not vaccinated. What to do for quarantine Stay home and away from other people for at least 5 days (day 0 through day 5) after your last contact with a person who has COVID-19. The date of your exposure is considered day 0. Wear a well-fitting mask when around others at home, if possible. For 10 days after your last close contact with someone with COVID-19, watch for fever (100.59F or greater), cough, shortness of breath, or other COVID-19 symptoms. If you develop symptoms, get tested immediately and isolate until you receive your test results. If you test positive, follow isolation recommendations. If you do not develop symptoms, get tested at least 5 days after you last had close contact with someone with COVID-19. If you test negative, you can leave your home, but continue to wear a well-fitting mask when around others at home and in public until 10 days after your last close contact with someone with COVID-19. If you test positive, you should isolate for at least 5 days  from the date of your positive test (if you do not have symptoms). If you do develop COVID-19 symptoms, isolate for at least 5 days from the date your symptoms began (the date the symptoms started is day 0). Follow recommendations in the isolation section below. If you are unable to get a test 5 days after last close contact with someone with COVID-19, you can leave your home  after day 5 if you have been without COVID-19 symptoms throughout the 5-day period. Wear a well-fitting mask for 10 days after your date of last close contact when around others at home and in public. Avoid people who are have weakened immune systems or are more likely to get very sick from COVID-19, and nursing homes and other high-risk settings, until after at least 10 days. If possible, stay away from people you live with, especially people who are at higher risk for getting very sick from COVID-19, as well as others outside your home throughout the full 10 days after your last close contact with someone with COVID-19. If you are unable to quarantine, you should wear a well-fitting mask for 10 days when around others at home and in public. If you are unable to wear a mask when around others, you should continue to quarantine for 10 days. Avoid people who have weakened immune systems or are more likely to get very sick from COVID-19, and nursing homes and other high-risk settings, until after at least 10 days. See additional information about travel. Do not go to places where you are unable to wear a mask, such as restaurants and some gyms, and avoid eating around others at home and at work until after 10 days after your last close contact with someone with COVID-19. After quarantine Watch for symptoms until 10 days after your last close contact with someone with COVID-19. If you have symptoms, isolate immediately and get tested. Quarantine in high-risk congregate settings In certain congregate settings that have high risk of  secondary transmission (such as Systems analyst and detention facilities, homeless shelters, or cruise ships), CDC recommends a 10-day quarantine for residents, regardless of vaccination and booster status. During periods of critical staffing shortages, facilities may consider shortening the quarantine period for staff to ensure continuity of operations. Decisions to shorten quarantine in these settings should be made in consultation with state, local, tribal, or territorial health departments and should take into consideration the context and characteristics of the facility. CDC's setting-specific guidance provides additional recommendations for these settings. Isolation Isolation is used to separate people with confirmed or suspected COVID-19 from those without COVID-19. People who are in isolation should stay home until it's safe for them to be around others. At home, anyone sick or infected should separate from others, or wear a well-fitting mask when they need to be around others. People in isolation should stay in a specific "sick room" or area and use a separate bathroom if available. Everyone who has presumed or confirmed COVID-19 should stay home and isolate from other people for at least 5 full days (day 0 is the first day of symptoms or the date of the day of the positive viral test for asymptomatic persons). They should wear a mask when around others at home and in public for an additional 5 days. People who are confirmed to have COVID-19 or are showing symptoms of COVID-19 need to isolate regardless of their vaccination status. This includes: People who have a positive viral test for COVID-19, regardless of whether or not they have symptoms. People with symptoms of COVID-19, including people who are awaiting test results or have not been tested. People with symptoms should isolate even if they do not know if they have been in close contact with someone with COVID-19. What to do for isolation Monitor  your symptoms. If you have an emergency warning sign (including trouble breathing), seek emergency medical care immediately. Stay in a separate room  from other household members, if possible. Use a separate bathroom, if possible. Take steps to improve ventilation at home, if possible. Avoid contact with other members of the household and pets. Don't share personal household items, like cups, towels, and utensils. Wear a well-fitting mask when you need to be around other people. Learn more about what to do if you are sick and how to notify your contacts. Ending isolation for people who had COVID-19 and had symptoms If you had COVID-19 and had symptoms, isolate for at least 5 days. To calculate your 5-day isolation period, day 0 is your first day of symptoms. Day 1 is the first full day after your symptoms developed. You can leave isolation after 5 full days. You can end isolation after 5 full days if you are fever-free for 24 hours without the use of fever-reducing medication and your other symptoms have improved (Loss of taste and smell may persist for weeks or months after recovery and need not delay the end of isolation). You should continue to wear a well-fitting mask around others at home and in public for 5 additional days (day 6 through day 10) after the end of your 5-day isolation period. If you are unable to wear a mask when around others, you should continue to isolate for a full 10 days. Avoid people who have weakened immune systems or are more likely to get very sick from COVID-19, and nursing homes and other high-risk settings, until after at least 10 days. If you continue to have fever or your other symptoms have not improved after 5 days of isolation, you should wait to end your isolation until you are fever-free for 24 hours without the use of fever-reducing medication and your other symptoms have improved. Continue to wear a well-fitting mask through day 10. Contact your healthcare  provider if you have questions. See additional information about travel. Do not go to places where you are unable to wear a mask, such as restaurants and some gyms, and avoid eating around others at home and at work until a full 10 days after your first day of symptoms. If an individual has access to a test and wants to test, the best approach is to use an antigen test1 towards the end of the 5-day isolation period. Collect the test sample only if you are fever-free for 24 hours without the use of fever-reducing medication and your other symptoms have improved (loss of taste and smell may persist for weeks or months after recovery and need not delay the end of isolation). If your test result is positive, you should continue to isolate until day 10. If your test result is negative, you can end isolation, but continue to wear a well-fitting mask around others at home and in public until day 10. Follow additional recommendations for masking and avoiding travel as described above. 1As noted in the labeling for authorized over-the counter antigen tests: Negative results should be treated as presumptive. Negative results do not rule out SARS-CoV-2 infection and should not be used as the sole basis for treatment or patient management decisions, including infection control decisions. To improve results, antigen tests should be used twice over a three-day period with at least 24 hours and no more than 48 hours between tests. Note that these recommendations on ending isolation do not apply to people who are moderately ill or very sick from COVID-19 or have weakened immune systems. See section below for recommendations for when to end isolation for these groups. Ending  isolation for people who tested positive for COVID-19 but had no symptoms If you test positive for COVID-19 and never develop symptoms, isolate for at least 5 days. Day 0 is the day of your positive viral test (based on the date you were tested) and day 1  is the first full day after the specimen was collected for your positive test. You can leave isolation after 5 full days. If you continue to have no symptoms, you can end isolation after at least 5 days. You should continue to wear a well-fitting mask around others at home and in public until day 10 (day 6 through day 10). If you are unable to wear a mask when around others, you should continue to isolate for 10 days. Avoid people who have weakened immune systems or are more likely to get very sick from COVID-19, and nursing homes and other high-risk settings, until after at least 10 days. If you develop symptoms after testing positive, your 5-day isolation period should start over. Day 0 is your first day of symptoms. Follow the recommendations above for ending isolation for people who had COVID-19 and had symptoms. See additional information about travel. Do not go to places where you are unable to wear a mask, such as restaurants and some gyms, and avoid eating around others at home and at work until 10 days after the day of your positive test. If an individual has access to a test and wants to test, the best approach is to use an antigen test1 towards the end of the 5-day isolation period. If your test result is positive, you should continue to isolate until day 10. If your test result is positive, you can also choose to test daily and if your test result is negative, you can end isolation, but continue to wear a well-fitting mask around others at home and in public until day 10. Follow additional recommendations for masking and avoiding travel as described above. 1As noted in the labeling for authorized over-the counter antigen tests: Negative results should be treated as presumptive. Negative results do not rule out SARS-CoV-2 infection and should not be used as the sole basis for treatment or patient management decisions, including infection control decisions. To improve results, antigen tests should  be used twice over a three-day period with at least 24 hours and no more than 48 hours between tests. Ending isolation for people who were moderately or very sick from COVID-19 or have a weakened immune system People who are moderately ill from COVID-19 (experiencing symptoms that affect the lungs like shortness of breath or difficulty breathing) should isolate for 10 days and follow all other isolation precautions. To calculate your 10-day isolation period, day 0 is your first day of symptoms. Day 1 is the first full day after your symptoms developed. If you are unsure if your symptoms are moderate, talk to a healthcare provider for further guidance. People who are very sick from COVID-19 (this means people who were hospitalized or required intensive care or ventilation support) and people who have weakened immune systems might need to isolate at home longer. They may also require testing with a viral test to determine when they can be around others. CDC recommends an isolation period of at least 10 and up to 20 days for people who were very sick from COVID-19 and for people with weakened immune systems. Consult with your healthcare provider about when you can resume being around other people. If you are unsure if your symptoms are  severe or if you have a weakened immune system, talk to a healthcare provider for further guidance. People who have a weakened immune system should talk to their healthcare provider about the potential for reduced immune responses to COVID-19 vaccines and the need to continue to follow current prevention measures (including wearing a well-fitting mask and avoiding crowds and poorly ventilated indoor spaces) to protect themselves against COVID-19 until advised otherwise by their healthcare provider. Close contacts of immunocompromised people--including household members--should also be encouraged to receive all recommended COVID-19 vaccine doses to help protect these  people. Isolation in high-risk congregate settings In certain high-risk congregate settings that have high risk of secondary transmission and where it is not feasible to cohort people (such as Systems analyst and detention facilities, homeless shelters, and cruise ships), CDC recommends a 10-day isolation period for residents. During periods of critical staffing shortages, facilities may consider shortening the isolation period for staff to ensure continuity of operations. Decisions to shorten isolation in these settings should be made in consultation with state, local, tribal, or territorial health departments and should take into consideration the context and characteristics of the facility. CDC's setting-specific guidance provides additional recommendations for these settings. This CDC guidance is meant to supplement--not replace--any federal, state, local, territorial, or tribal health and safety laws, rules, and regulations. Recommendations for specific settings These recommendations do not apply to healthcare professionals. For guidance specific to these settings, see Healthcare professionals: Interim Guidance for Optician, dispensing with SARS-CoV-2 Infection or Exposure to SARS-CoV-2 Patients, residents, and visitors to healthcare settings: Interim Infection Prevention and Control Recommendations for Healthcare Personnel During the [place] 2019 (COVID-19) Pandemic Additional setting-specific guidance and recommendations are available. These recommendations on quarantine and isolation do apply to [facility] settings. Additional guidance is available here: Overview of COVID-19 Quarantine for K-12 Schools Travelers: Travel information and recommendations Congregate facilities and other settings: [employer] for community, work, and school settings Ongoing COVID-19 exposure FAQs I live with someone with COVID-19, but I cannot be separated from them. How do we manage quarantine  in this situation? It is very important for people with COVID-19 to remain apart from other people, if possible, even if they are living together. If separation of the person with COVID-19 from others that they live with is not possible, the other people that they live with will have ongoing exposure, meaning they will be repeatedly exposed until that person is no longer able to spread the virus to other people. In this situation, there are precautions you can take to limit the spread of COVID-19: The person with COVID-19 and everyone they live with should wear a well-fitting mask inside the home. If possible, one person should care for the person with COVID-19 to limit the number of people who are in close contact with the infected person. Take steps to protect yourself and others to reduce transmission in the home: Quarantine if you are not up to date with your COVID-19 vaccines. Isolate if you are sick or tested positive for COVID-19, even if you don't have symptoms. Learn more about the public health recommendations for testing, mask use and quarantine of close contacts, like yourself, who have ongoing exposure. These recommendations differ depending on your vaccination status. What should I do if I have ongoing exposure to COVID-19 from someone I live with? Recommendations for this situation depend on your vaccination status: If you are not up to date on COVID-19 vaccines and have ongoing exposure to COVID-19, you should: Begin quarantine  immediately and continue to quarantine throughout the isolation period of the person with COVID-19. Continue to quarantine for an additional 5 days starting the day after the end of isolation for the person with COVID-19. Get tested at least 5 days after the end of isolation of the infected person that lives with them. If you test negative, you can leave the home but should continue to wear a well-fitting mask when around others at home and in public until 10  days after the end of isolation for the person with COVID-19. Isolate immediately if you develop symptoms of COVID-19 or test positive. If you are up to date with COVID-19 vaccines and have ongoing exposure to COVID-19, you should: Get tested at least 5 days after your first exposure. A person with COVID-19 is considered infectious starting 2 days before they develop symptoms, or 2 days before the date of their positive test if they do not have symptoms. Get tested again at least 5 days after the end of isolation for the person with COVID-19. Wear a well-fitting mask when you are around the person with COVID-19, and do this throughout their isolation period. Wear a well-fitting mask around others for 10 days after the infected person's isolation period ends. Isolate immediately if you develop symptoms of COVID-19 or test positive. What should I do if multiple people I live with test positive for COVID-19 at different times? Recommendations for this situation depend on your vaccination status: If you are not up to date with your COVID-19 vaccines, you should: Quarantine throughout the isolation period of any infected person that you live with. Continue to quarantine until 5 days after the end of isolation date for the most recently infected person that lives with you. For example, if the last day of isolation of the person most recently infected with COVID-19 was June 30, the new 5-day quarantine period starts on July 1. Get tested at least 5 days after the end of isolation for the most recently infected person that lives with you. Wear a well-fitting mask when you are around any person with COVID-19 while that person is in isolation. Wear a well-fitting mask when you are around other people until 10 days after your last close contact. Isolate immediately if you develop symptoms of COVID-19 or test positive. If you are up to date with your COVID-19 vaccines, you should: Get tested at least 5 days  after your first exposure. A person with COVID-19 is considered infectious starting 2 days before they developed symptoms, or 2 days before the date of their positive test if they do not have symptoms. Get tested again at least 5 days after the end of isolation for the most recently infected person that lives with you. Wear a well-fitting mask when you are around any person with COVID-19 while that person is in isolation. Wear a well-fitting mask around others for 10 days after the end of isolation for the most recently infected person that lives with you. For example, if the last day of isolation for the person most recently infected with COVID-19 was June 30, the new 10-day period to wear a well-fitting mask indoors in public starts on July 1. Isolate immediately if you develop symptoms of COVID-19 or test positive. I had COVID-19 and completed isolation. Do I have to quarantine or get tested if someone I live with gets COVID-19 shortly after I completed isolation? No. If you recently completed isolation and someone that lives with you tests positive for the virus  that causes COVID-19 shortly after the end of your isolation period, you do not have to quarantine or get tested as long as you do not develop new symptoms. Once all of the people that live together have completed isolation or quarantine, refer to the guidance below for new exposures to COVID-19. If you had COVID-19 in the previous 90 days and then came into close contact with someone with COVID-19, you do not have to quarantine or get tested if you do not have symptoms. But you should: Wear a well-fitting mask indoors in public for 10 days after your last close contact. Monitor for COVID-19 symptoms for 10 days from the date of your last close contact. Isolate immediately and get tested if symptoms develop. If more than 90 days have passed since your recovery from infection, follow CDC's recommendations for close contacts. These  recommendations will differ depending on your vaccination status. 09/26/2020 Content source: [facility] for Immunization and Respiratory Diseases ([facility]), Division of Viral Diseases This information is not intended to replace advice given to you by your health care provider. Make sure you discuss any questions you have with your health care provider. Document Revised: 01/30/2021 Document Reviewed: 01/30/2021 Elsevier Patient Education  2022 [place]: What to Do if You Are Sick If you test positive and are an older adult or someone who is at high risk of getting very sick from COVID-19, treatment may be available. Contact a healthcare provider right away after a positive test to determine if you are eligible, even if your symptoms are mild right now. You can also visit a Test to Treat location and, if eligible, receive a prescription from a provider. Don't delay: Treatment must be started within the first few days to be effective. If you have a fever, cough, or other symptoms, you might have COVID-19. Most people have mild illness and are able to recover at home. If you are sick: Keep track of your symptoms. If you have an emergency warning sign (including trouble breathing), call 911. Steps to help prevent the spread of COVID-19 if you are sick If you are sick with COVID-19 or think you might have COVID-19, follow the steps below to care for yourself and to help protect other people in your home and community. Stay home except to get medical care Stay home. Most people with COVID-19 have mild illness and can recover at home without medical care. Do not leave your home, except to get medical care. Do not visit public areas and do not go to places where you are unable to wear a mask. Take care of yourself. Get rest and stay hydrated. Take over-the-counter medicines, such as acetaminophen, to help you feel better. Stay in touch with your doctor. Call before you get medical care. Be  sure to get care if you have trouble breathing, or have any other emergency warning signs, or if you think it is an emergency. Avoid public transportation, ride-sharing, or taxis if possible. Get tested If you have symptoms of COVID-19, get tested. While waiting for test results, stay away from others, including staying apart from those living in your household. Get tested as soon as possible after your symptoms start. Treatments may be available for people with COVID-19 who are at risk for becoming very sick. Don't delay: Treatment must be started early to be effective--some treatments must begin within 5 days of your first symptoms. Contact your healthcare provider right away if your test result is positive to determine if you  are eligible. Self-tests are one of several options for testing for the virus that causes COVID-19 and may be more convenient than laboratory-based tests and point-of-care tests. Ask your healthcare provider or your local health department if you need help interpreting your test results. You can visit your state, tribal, local, and territorial health department's website to look for the latest local information on testing sites. Separate yourself from other people As much as possible, stay in a specific room and away from other people and pets in your home. If possible, you should use a separate bathroom. If you need to be around other people or animals in or outside of the home, wear a well-fitting mask. Tell your close contacts that they may have been exposed to COVID-19. An infected person can spread COVID-19 starting 48 hours (or 2 days) before the person has any symptoms or tests positive. By letting your close contacts know they may have been exposed to COVID-19, you are helping to protect everyone. See COVID-19 and Animals if you have questions about pets. If you are diagnosed with COVID-19, someone from the health department may call you. Answer the call to slow the  spread. Monitor your symptoms Symptoms of COVID-19 include fever, cough, or other symptoms. Follow care instructions from your healthcare provider and local health department. Your local health authorities may give instructions on checking your symptoms and reporting information. When to seek emergency medical attention Look for emergency warning signs* for COVID-19. If someone is showing any of these signs, seek emergency medical care immediately: Trouble breathing Persistent pain or pressure in the chest New confusion Inability to wake or stay awake Pale, gray, or blue-colored skin, lips, or nail beds, depending on skin tone *This list is not all possible symptoms. Please call your medical provider for any other symptoms that are severe or concerning to you. Call 911 or call ahead to your local emergency facility: Notify the operator that you are seeking care for someone who has or may have COVID-19. Call ahead before visiting your doctor Call ahead. Many medical visits for routine care are being postponed or done by phone or telemedicine. If you have a medical appointment that cannot be postponed, call your doctor's office, and tell them you have or may have COVID-19. This will help the office protect themselves and other patients. If you are sick, wear a well-fitting mask You should wear a mask if you must be around other people or animals, including pets (even at home). Wear a mask with the best fit, protection, and comfort for you. You don't need to wear the mask if you are alone. If you can't put on a mask (because of trouble breathing, for example), cover your coughs and sneezes in some other way. Try to stay at least 6 feet away from other people. This will help protect the people around you. Masks should not be placed on young children under age 42 years, anyone who has trouble breathing, or anyone who is not able to remove the mask without help. Cover your coughs and sneezes Cover  your mouth and nose with a tissue when you cough or sneeze. Throw away used tissues in a lined trash can. Immediately wash your hands with soap and water for at least 20 seconds. If soap and water are not available, clean your hands with an alcohol-based hand sanitizer that contains at least 60% alcohol. Clean your hands often Wash your hands often with soap and water for at least 20 seconds. This  is especially important after blowing your nose, coughing, or sneezing; going to the bathroom; and before eating or preparing food. Use hand sanitizer if soap and water are not available. Use an alcohol-based hand sanitizer with at least 60% alcohol, covering all surfaces of your hands and rubbing them together until they feel [name]. Soap and water are the best option, especially if hands are visibly dirty. Avoid touching your eyes, nose, and mouth with unwashed hands. Handwashing Tips Avoid sharing personal household items Do not share dishes, drinking glasses, cups, eating utensils, towels, or bedding with other people in your home. Wash these items thoroughly after using them with soap and water or put in the dishwasher. Clean surfaces in your home regularly Clean and disinfect high-touch surfaces (for example, doorknobs, tables, handles, light switches, and countertops) in your "sick room" and bathroom. In shared spaces, you should clean and disinfect surfaces and items after each use by the person who is ill. If you are sick and cannot clean, a caregiver or other person should only clean and disinfect the area around you (such as your bedroom and bathroom) on an as needed basis. Your caregiver/other person should wait as long as possible (at least several hours) and wear a mask before entering, cleaning, and disinfecting shared spaces that you use. Clean and disinfect areas that may have blood, stool, or body fluids on them. Use household cleaners and disinfectants. Clean visible dirty surfaces with  household cleaners containing soap or detergent. Then, use a household disinfectant. Use a product from [employer]: Disinfectants for Coronavirus (GXQJJ-94). Be sure to follow the instructions on the label to ensure safe and effective use of the product. Many products recommend keeping the surface wet with a disinfectant for a certain period of time (look at "contact time" on the product label). You may also need to wear personal protective equipment, such as gloves, depending on the directions on the product label. Immediately after disinfecting, wash your hands with soap and water for 20 seconds. For completed guidance on cleaning and disinfecting your home, visit Complete Disinfection Guidance. Take steps to improve ventilation at home Improve ventilation (air flow) at home to help prevent from spreading COVID-19 to other people in your household. Clear out COVID-19 virus particles in the air by opening windows, using air filters, and turning on fans in your home. Use this interactive tool to learn how to improve air flow in your home. When you can be around others after being sick with COVID-19 Deciding when you can be around others is different for different situations. Find out when you can safely end home isolation. For any additional questions about your care, contact your healthcare provider or state or local health department. 09/18/2020 Content source: [facility] for Immunization and Respiratory Diseases ([facility]), Division of Viral Diseases This information is not intended to replace advice given to you by your health care provider. Make sure you discuss any questions you have with your health care provider. Document Revised: 03/08/2021 Document Reviewed: 03/08/2021 Elsevier Patient Education  [place].

## 2022-01-10 NOTE — Telephone Encounter (Signed)
FYI

## 2022-01-14 ENCOUNTER — Encounter: Payer: Self-pay | Admitting: Internal Medicine

## 2022-02-19 ENCOUNTER — Encounter: Payer: Self-pay | Admitting: Cardiology

## 2022-02-26 ENCOUNTER — Telehealth: Payer: Self-pay

## 2022-02-26 ENCOUNTER — Other Ambulatory Visit: Payer: Self-pay | Admitting: Internal Medicine

## 2022-02-26 ENCOUNTER — Encounter: Payer: Self-pay | Admitting: Physician Assistant

## 2022-02-26 ENCOUNTER — Ambulatory Visit (INDEPENDENT_AMBULATORY_CARE_PROVIDER_SITE_OTHER): Admitting: Physician Assistant

## 2022-02-26 ENCOUNTER — Ambulatory Visit: Payer: Self-pay | Admitting: *Deleted

## 2022-02-26 VITALS — BP 112/79 | HR 119 | Resp 18 | Ht 70.0 in | Wt 204.0 lb

## 2022-02-26 DIAGNOSIS — T50905A Adverse effect of unspecified drugs, medicaments and biological substances, initial encounter: Secondary | ICD-10-CM

## 2022-02-26 DIAGNOSIS — R000 Tachycardia, unspecified: Secondary | ICD-10-CM

## 2022-02-26 DIAGNOSIS — E1165 Type 2 diabetes mellitus with hyperglycemia: Secondary | ICD-10-CM

## 2022-02-26 DIAGNOSIS — R112 Nausea with vomiting, unspecified: Secondary | ICD-10-CM

## 2022-02-26 DIAGNOSIS — R197 Diarrhea, unspecified: Secondary | ICD-10-CM

## 2022-02-26 DIAGNOSIS — I459 Conduction disorder, unspecified: Secondary | ICD-10-CM | POA: Diagnosis not present

## 2022-02-26 DIAGNOSIS — Z794 Long term (current) use of insulin: Secondary | ICD-10-CM

## 2022-02-26 MED ORDER — ONDANSETRON 8 MG PO TBDP: 8.0000 mg | ORAL_TABLET | Freq: Three times a day (TID) | ORAL | 0 refills | Status: DC | PRN

## 2022-02-26 MED ORDER — TRULICITY 3 MG/0.5ML ~~LOC~~ SOAJ: 3.0000 mg | SUBCUTANEOUS | 2 refills | Status: DC

## 2022-02-26 NOTE — Patient Instructions (Signed)
Your symptoms may be a reaction to the increased dose of Trulicity, however it may also be gastritis.  I do encourage you to reach out to endocrinology for further suggestions on your next dosing of Trulicity.  I encourage you to use the Prilosec on a daily basis for the next couple of days, make sure you are staying very well-hydrated and eat a gentle diet.  I did send a prescription of Zofran to your pharmacy to help you if the nausea does return.  Kennieth Rad, PA-C Physician Assistant [facility] Medicine [email or web address]   Sinus Tachycardia  Sinus tachycardia is a kind of fast heartbeat. In sinus tachycardia, the heart beats more than 100 times a minute. Sinus tachycardia starts in a part of the heart called the sinus node. Sinus tachycardia may be harmless, or it may be a sign of a serious condition. What are the causes? This condition may be caused by: Exercise or exertion. A fever. Pain. Loss of body fluids (dehydration). Severe bleeding (hemorrhage). Anxiety and stress. Certain substances, including: Alcohol. Caffeine. Tobacco and nicotine products. Cold medicines. Illegal drugs. Medical conditions including: Heart disease. An infection. An overactive thyroid (hyperthyroidism). A lack of red blood cells (anemia). What are the signs or symptoms? Symptoms of this condition include: A feeling that the heart is beating quickly (palpitations). Suddenly noticing your heartbeat (cardiac awareness). Dizziness. Tiredness (fatigue). Shortness of breath. Chest pain. Nausea. Fainting. How is this diagnosed? This condition is diagnosed with: A physical exam. Other tests, such as: Blood tests. An electrocardiogram (ECG). This test measures the electrical activity of the heart. Ambulatory cardiac monitor. This records your heartbeats for 24 hours or more. You may be referred to a heart specialist (cardiologist). How is  this treated? Treatment for this condition depends on the cause or the underlying condition. Treatment may involve: Treating the underlying condition. Taking new medicines or changing your current medicines as told by your health care provider. Making changes to your diet or lifestyle. Follow these instructions at home: Lifestyle  Do not use any products that contain nicotine or tobacco, such as cigarettes and e-cigarettes. If you need help quitting, ask your health care provider. Do not use illegal drugs, such as cocaine. Learn relaxation methods to help you when you get stressed or anxious. These include deep breathing. Avoid caffeine or other stimulants. Alcohol use  Do not drink alcohol if: Your health care provider tells you not to drink. You are pregnant, may be pregnant, or are planning to become pregnant. If you drink alcohol, limit how much you have: 0-1 drink a day for women. 0-2 drinks a day for men. Be aware of how much alcohol is in your drink. In the [place] one drink equals one typical bottle of beer (12 oz), one-half glass of wine (5 oz), or one shot of hard liquor (1 oz). General instructions Drink enough fluids to keep your urine pale yellow. Take over-the-counter and prescription medicines only as told by your health care provider. Keep all follow-up visits as told by your health care provider. This is important. Contact a health care provider if you have: A fever. Vomiting or diarrhea that does not go away. Get help right away if you: Have pain in your chest, upper arms, jaw, or neck. Become weak or dizzy. Feel faint. Have palpitations that do not go away. Summary In sinus tachycardia, the heart beats more than 100 times a minute. Sinus tachycardia may be harmless, or it may be a sign of  a serious condition. Treatment for this condition depends on the cause or the underlying condition. Get help right away if you have pain in your chest, upper arms, jaw, or  neck. This information is not intended to replace advice given to you by your health care provider. Make sure you discuss any questions you have with your health care provider. Document Revised: 10/25/2020 Document Reviewed: 10/25/2020 Elsevier Patient Education  [place].

## 2022-02-26 NOTE — Telephone Encounter (Signed)
Summary: nausea and stomach discomfort / rx concern   The patient has experienced nausea and diarrhea since yesterday evening   The patient recently took their injection of Dulaglutide (TRULICITY) 4.5 MB/5.5HR SOPN [[id number]] and felt bloating and stomach discomfort   The patient shares that they have been drinking water to help with their stomach discomfort and are experiencing a slight headache   The patient would like to be contacted further when possible      Reason for Disposition  [1] Caller has URGENT medicine question about med that PCP or specialist prescribed AND [2] triager unable to answer question  Answer Assessment - Initial Assessment Questions 1. NAME of MEDICINE: "What medicine(s) are you calling about?"     Trulity- increase in dose to 4.5- Monday am 2. QUESTION: "What is your question?" (e.g., double dose of medicine, side effect)     Could that be causing symptoms 3. PRESCRIBER: "Who prescribed the medicine?" Reason: if prescribed by specialist, call should be referred to that group.     endocrine 4. SYMPTOMS: "Do you have any symptoms?" If Yes, ask: "What symptoms are you having?"  "How bad are the symptoms (e.g., mild, moderate, severe)     Bloating, diarrhea, vomiting Glucose- 111,81- last night, 125 now Pulse- 112 Nausea/vomiting- subsided, diarrhea is better too  Protocols used: Medication Question Call-A-AH

## 2022-02-26 NOTE — Telephone Encounter (Signed)
Patient advised.

## 2022-02-26 NOTE — Telephone Encounter (Signed)
  Chief Complaint: possible medication reaction- increased dosing Trulicity Symptoms: bloating, nausea/vomiting, diarrhea Frequency: started last night- has subsided now Pertinent Negatives: Patient denies fever, nausea, diarrhea Disposition: '[]'$ ED /'[]'$ Urgent Care (no appt availability in office) / '[]'$ Appointment(In office/virtual)/ '[]'$  [facility]/ '[]'$ Home Care/ '[]'$ Refused Recommended Disposition /'[x]'$ [place] [facility]/ '[]'$  Follow-up with PCP Additional Notes: Appointment scheduled with mobile provider

## 2022-02-26 NOTE — Progress Notes (Unsigned)
   Established Patient Office Visit  Subjective   [name], male    DOB: 1971-04-06  Age: 51 y.o. MRN: [id number]  No chief complaint on file.   Increase dose of 3 to 4.5 Felt bloated throughout the day Heart beating faster Felt nausea and diarrhea - three times    Dropped beat Take prilosec  {History (Optional):23778}  ROS    Objective:     There were no vitals taken for this visit. {Vitals History (Optional):23777}  Physical Exam   No results found for any visits on 02/26/22.  {Labs (Optional):23779}  The ASCVD Risk score (Arnett DK, et al., 2019) failed to calculate for the following reasons:   The valid total cholesterol range is 130 to 320 mg/dL    Assessment & Plan:   Problem List Items Addressed This Visit   None   No follow-ups on file.    Loraine Grip Mayers, PA-C

## 2022-02-26 NOTE — Telephone Encounter (Signed)
Patient states that he had vomiting and diarrhea  with the Trulicity 4.'5mg'$  dose and would like to know if he can state on the '3mg'$  dose.

## 2022-02-27 ENCOUNTER — Encounter: Payer: Self-pay | Admitting: Physician Assistant

## 2022-02-27 DIAGNOSIS — I459 Conduction disorder, unspecified: Secondary | ICD-10-CM | POA: Insufficient documentation

## 2022-03-13 ENCOUNTER — Ambulatory Visit: Admitting: Physician Assistant

## 2022-04-02 ENCOUNTER — Other Ambulatory Visit: Payer: Self-pay

## 2022-04-04 ENCOUNTER — Telehealth (HOSPITAL_COMMUNITY): Payer: Self-pay | Admitting: *Deleted

## 2022-04-04 ENCOUNTER — Other Ambulatory Visit: Payer: Self-pay

## 2022-04-04 DIAGNOSIS — Z794 Long term (current) use of insulin: Secondary | ICD-10-CM

## 2022-04-04 MED ORDER — LANTUS SOLOSTAR 100 UNIT/ML ~~LOC~~ SOPN: 46.0000 [IU] | PEN_INJECTOR | Freq: Every day | SUBCUTANEOUS | 3 refills | Status: DC

## 2022-04-04 NOTE — Telephone Encounter (Signed)
Attempted to call patient regarding upcoming cardiac PET appointment and to remind patient to avoid caffeine 12 hours prior to appointment. Left message on voicemail with name and callback number  Gordy Clement RN Navigator Cardiac [facility] [phone] Office [phone] Cell

## 2022-04-07 ENCOUNTER — Other Ambulatory Visit: Payer: Self-pay | Admitting: Cardiovascular Disease

## 2022-04-07 DIAGNOSIS — R079 Chest pain, unspecified: Secondary | ICD-10-CM

## 2022-04-07 NOTE — Progress Notes (Signed)
Consent for cardiac PET stress test placed. This order is placed administratively for the primary cardiologist Dr. Radford Pax. Please see progress note from 01/06/2022 for further details.   [place] T. Audie Box, MD, [place]  [address] [place], [place] [address] [phone]  10:42 AM

## 2022-04-08 ENCOUNTER — Encounter (HOSPITAL_COMMUNITY)
Admission: RE | Admit: 2022-04-08 | Discharge: 2022-04-08 | Disposition: A | Discharge status: Home / Self Care | Source: Ambulatory Visit | Attending: Cardiology | Admitting: Cardiology

## 2022-04-08 ENCOUNTER — Other Ambulatory Visit (HOSPITAL_COMMUNITY)

## 2022-04-08 DIAGNOSIS — R072 Precordial pain: Secondary | ICD-10-CM

## 2022-04-08 LAB — NM PET CT CARDIAC PERFUSION MULTI W/ABSOLUTE BLOODFLOW
MBFR: 4.12
Nuc Rest EF: 55 %
Nuc Stress EF: 57 %
Rest MBF: 0.59 ml/g/min
ST Depression (mm): 0 mm
Stress MBF: 2.43 ml/g/min

## 2022-04-08 MED ORDER — RUBIDIUM RB82 GENERATOR (RUBYFILL)
24.2400 | PACK | Freq: Once | INTRAVENOUS | Status: AC
  Administered 2022-04-08: 24.24 via INTRAVENOUS

## 2022-04-08 MED ORDER — REGADENOSON 0.4 MG/5ML IV SOLN
INTRAVENOUS | Status: AC
  Administered 2022-04-08: 0.4 mg via INTRAVENOUS
  Filled 2022-04-08: qty 5

## 2022-04-08 MED ORDER — REGADENOSON 0.4 MG/5ML IV SOLN
0.4000 mg | Freq: Once | INTRAVENOUS | Status: AC
Start: 2022-04-08 — End: 2022-04-08

## 2022-04-08 MED ORDER — RUBIDIUM RB82 GENERATOR (RUBYFILL)
27.0600 | PACK | Freq: Once | INTRAVENOUS | Status: AC
  Administered 2022-04-08: 24.06 via INTRAVENOUS

## 2022-04-08 NOTE — Progress Notes (Signed)
Patient presents for a cardiac PET stress test and tolerated procedure without incident. Patient maintained acceptable vital signs throughout the test and was offered caffeine after test.  Patient ambulated out of department with a steady gait.  

## 2022-04-11 ENCOUNTER — Telehealth: Payer: Self-pay | Admitting: Cardiology

## 2022-04-11 ENCOUNTER — Telehealth: Payer: Self-pay

## 2022-04-11 DIAGNOSIS — R079 Chest pain, unspecified: Secondary | ICD-10-CM

## 2022-04-11 NOTE — Telephone Encounter (Signed)
The patient has been notified of the result and verbalized understanding.  All questions (if any) were answered. Paul Iba, RN 04/11/2022 10:59 AM  Patient states that he still gets chest discomfort every once in a while. No associated symptoms.

## 2022-04-11 NOTE — Telephone Encounter (Signed)
-----   Message from Sueanne Margarita, MD sent at 04/08/2022  8:04 PM EDT ----- Please let patient know that CT PET stress test was fine and showed normal blood flow.  He does have coronary artery calcifications corresponding to known CAD - please find out if his CP has settled down

## 2022-04-11 NOTE — Telephone Encounter (Signed)
-----   Message from Sueanne Margarita, MD sent at 04/11/2022  1:48 PM EDT ----- Please refer to GI to rule out GI issue causing CP and followup with me after he sees GI ----- Message ----- From: Antonieta Iba, RN Sent: 04/11/2022  11:05 AM EDT To: Sueanne Margarita, MD  The patient has been notified of the result and verbalized understanding.  All questions (if any) were answered. Antonieta Iba, RN 04/11/2022 11:05 AM   Patient states that he still gets chest discomfort at times. No associated symptoms.

## 2022-04-11 NOTE — Telephone Encounter (Signed)
Patient is returning RN's call for his CT PET results. Please advise.

## 2022-04-14 ENCOUNTER — Ambulatory Visit (INDEPENDENT_AMBULATORY_CARE_PROVIDER_SITE_OTHER): Admitting: Internal Medicine

## 2022-04-14 ENCOUNTER — Encounter: Payer: Self-pay | Admitting: Internal Medicine

## 2022-04-14 VITALS — BP 122/78 | HR 68 | Ht 70.0 in | Wt 209.0 lb

## 2022-04-14 DIAGNOSIS — Z794 Long term (current) use of insulin: Secondary | ICD-10-CM | POA: Diagnosis not present

## 2022-04-14 DIAGNOSIS — E1165 Type 2 diabetes mellitus with hyperglycemia: Secondary | ICD-10-CM | POA: Diagnosis not present

## 2022-04-14 DIAGNOSIS — E11319 Type 2 diabetes mellitus with unspecified diabetic retinopathy without macular edema: Secondary | ICD-10-CM

## 2022-04-14 DIAGNOSIS — E1159 Type 2 diabetes mellitus with other circulatory complications: Secondary | ICD-10-CM

## 2022-04-14 LAB — POCT GLYCOSYLATED HEMOGLOBIN (HGB A1C): Hemoglobin A1C: 8.9 % — AB (ref 4.0–5.6)

## 2022-04-14 LAB — POCT GLUCOSE (DEVICE FOR HOME USE): POC Glucose: 171 mg/dl — AB (ref 70–99)

## 2022-04-14 MED ORDER — FREESTYLE LIBRE 2 SENSOR MISC: 1.0000 | 3 refills | Status: DC

## 2022-04-14 NOTE — Progress Notes (Unsigned)
Name: Paul [name]  MRN/ DOB: [id number], 09/02/1970   Age/ Sex: 51 y.o., male    PCP: Ladell Pier, MD   Reason for Endocrinology Evaluation: Type 2 Diabetes Mellitus     Date of Initial Endocrinology Visit: 12/02/2021    PATIENT IDENTIFIER: [name] is a 51 y.o. male with a past medical history of DM, CAD, HTN and dyslipidemia, OSA on CPAP . The patient presented for initial endocrinology clinic visit on 12/02/2021  for consultative assistance with his diabetes management.    HPI: [name] was    Diagnosed with DM 2019 Hemoglobin A1c has ranged from 8.3% in 2022, peaking at 9.9% in 2019.  On his initial visit to our clinic his A1c was 7.9%, he was on metformin, Trulicity, Jardiance, and Lantus. We increased Trulicity and Jardiance, continued metformin and basal insulin   SUBJECTIVE:   During the last visit (12/02/2021): A1c 7.9%  Today (04/14/22): [name] is here for a follow up on diabetes management. He checks his blood sugars occasionally . The patient has not had hypoglycemic episodes since the last clinic visit.  He had a follow-up with cardiology on 01/06/2022  Weight has been stable   Has not been exercising Admits to dietary indiscretion   HOME DIABETES REGIMEN: Metformin 1749 mg BID Trulicity 3 mg weekly ( Monday)  Jardiance 25 mg daily  Lantus 46 units daily     Statin: yes ACE-I/ARB: no    METER DOWNLOAD SUMMARY: did not bring     DIABETIC COMPLICATIONS: Microvascular complications:  Retinopathy (mild)  Denies: CKD, neuropathy  Last eye exam: Completed 10/2021  Macrovascular complications:  CAD (S/P PCI) Denies: PVD, CVA   PAST HISTORY: Past Medical History:  Past Medical History:  Diagnosis Date   Diabetes mellitus without complication ([place])    Fatty liver    abnormal CT 2014   Heart disease    Hyperlipidemia    Kidney stones    Sleep apnea    wears CPAP   Past Surgical History:  Past Surgical  History:  Procedure Laterality Date   COLONOSCOPY     CORONARY STENT INTERVENTION N/A 08/26/2019   Procedure: CORONARY STENT INTERVENTION;  Surgeon: Wellington Hampshire, MD;  Location: [facility];  Service: Cardiovascular;  Laterality: N/A;   KNEE ARTHROSCOPY Right    LEFT HEART CATH AND CORONARY ANGIOGRAPHY N/A 08/26/2019   Procedure: LEFT HEART CATH AND CORONARY ANGIOGRAPHY;  Surgeon: Wellington Hampshire, MD;  Location: [facility];  Service: Cardiovascular;  Laterality: N/A;   UMBILICAL HERNIA REPAIR      Social History:  reports that he has never smoked. He has never used smokeless tobacco. He reports current alcohol use. He reports that he does not use drugs. Family History:  Family History  Problem Relation Age of Onset   Hypertension Father    Diabetes Mother    Sleep apnea Other    Heart attack Maternal Grandfather    Heart attack Paternal Grandfather    Cancer Neg Hx    Colon cancer Neg Hx    Esophageal cancer Neg Hx    Stomach cancer Neg Hx    Rectal cancer Neg Hx      HOME MEDICATIONS: Allergies as of 04/14/2022       Reactions   Codeine Nausea And Vomiting, Other (See Comments)   headache   Crestor [rosuvastatin]    Elveated LFTs with Crestor '40mg'$  daily   Hydrocodone Nausea And Vomiting  Oxycodone Hcl Other (See Comments)   Headaches        Medication List        Accurate as of April 14, 2022 11:31 AM. If you have any questions, ask your nurse or doctor.          STOP taking these medications    ondansetron 8 MG disintegrating tablet Commonly known as: ZOFRAN-ODT Stopped by: Dorita Sciara, MD   promethazine-dextromethorphan 6.25-15 MG/5ML syrup Commonly known as: PROMETHAZINE-DM Stopped by: Dorita Sciara, MD       TAKE these medications    aspirin EC 81 MG tablet Take 1 tablet (81 mg total) by mouth daily.   B-D UF III MINI PEN NEEDLES 31G X 5 MM Misc Generic drug: Insulin Pen Needle Inject 1 Device into the  skin daily in the afternoon. 5 mm 31G 3/15.  Use as directed   clopidogrel 75 MG tablet Commonly known as: PLAVIX Take 1 tablet (75 mg total) by mouth daily.   Dexcom G6 Sensor Misc 1 Device by Does not apply route as directed.   Dexcom G6 Transmitter Misc 1 Device by Does not apply route as directed.   empagliflozin 25 MG Tabs tablet Commonly known as: JARDIANCE Take 1 tablet (25 mg total) by mouth daily before breakfast.   Lantus SoloStar 100 UNIT/ML Solostar Pen Generic drug: insulin glargine Inject 46 Units into the skin daily. INJECT 46 UNITS UNDER THE SKIN DAILY. Decrease to 44u daily once you start the new Trulicity dose.   metFORMIN 1000 MG tablet Commonly known as: GLUCOPHAGE Take 1 tablet (1,000 mg total) by mouth 2 (two) times daily with a meal.   metoprolol succinate 25 MG 24 hr tablet Commonly known as: Toprol XL Take 0.5 tablets (12.5 mg total) by mouth daily.   nitroGLYCERIN 0.4 MG SL tablet Commonly known as: Nitrostat Place 1 tablet (0.4 mg total) under the tongue every 5 (five) minutes as needed.   omeprazole 20 MG capsule Commonly known as: PRILOSEC Take 1 capsule (20 mg total) by mouth daily as needed.   rosuvastatin 10 MG tablet Commonly known as: CRESTOR TAKE 1 TABLET(10 MG) BY MOUTH DAILY.   sildenafil 50 MG tablet Commonly known as: VIAGRA Take 50 mg by mouth daily as needed for erectile dysfunction.   THERAWORX RELIEF EX Apply 1 application topically daily as needed (muscle cramps / pains).   Trulicity 3 RC/7.8LF Sopn Generic drug: Dulaglutide Inject 3 mg as directed once a week.   Turmeric 500 MG Caps Take 1,000 mg by mouth every evening.         ALLERGIES: Allergies  Allergen Reactions   Codeine Nausea And Vomiting and Other (See Comments)    headache   Crestor [Rosuvastatin]     Elveated LFTs with Crestor '40mg'$  daily    Hydrocodone Nausea And Vomiting   Oxycodone Hcl Other (See Comments)    Headaches     REVIEW OF  SYSTEMS: A comprehensive ROS was conducted with the patient and is negative except as per HPI    OBJECTIVE:   VITAL SIGNS: BP 122/78 (BP Location: Left Arm, Patient Position: Sitting, Cuff Size: Large)   Pulse 68   Ht '5\' 10"'$  (1.778 m)   Wt 209 lb (94.8 kg)   SpO2 99%   BMI 29.99 kg/m    PHYSICAL EXAM:  General: Pt appears well and is in NAD  Neck: General: Supple without adenopathy or carotid bruits. Thyroid: Thyroid size normal.  No goiter or nodules  appreciated.   Lungs: Clear with good BS bilat with no rales, rhonchi, or wheezes  Heart: RRR with normal S1 and S2 and no gallops; no murmurs; no rub  Abdomen: Normoactive bowel sounds, soft, nontender, without masses or organomegaly palpable  Extremities:  Lower extremities - No pretibial edema. No lesions.  Neuro: MS is good with appropriate affect, pt is alert and Ox3    DM foot exam: 12/02/2021  The skin of the feet is intact without sores or ulcerations, right great toe nail discoloration  The pedal pulses are 2+ on right and 2+ on left. The sensation is intact to a screening 5.07, 10 gram monofilament bilaterally   DATA REVIEWED:  Lab Results  Component Value Date   HGBA1C 7.9 (A) 12/02/2021   HGBA1C 8.3 (A) 04/26/2021   HGBA1C 8.6 (A) 01/24/2021    Latest Reference Range & Units 12/02/21 12:13  Sodium 135 - 145 mEq/L 141  Potassium 3.5 - 5.1 mEq/L 4.2  Chloride 96 - 112 mEq/L 104  CO2 19 - 32 mEq/L 29  Glucose 70 - 99 mg/dL 76  BUN 6 - 23 mg/dL 21  Creatinine 0.40 - 1.50 mg/dL 0.84  Calcium 8.4 - 10.5 mg/dL 10.1  GFR >60.00 mL/min 100.19    Latest Reference Range & Units 12/02/21 12:13  Total CHOL/HDL Ratio  3  Cholesterol 0 - 200 mg/dL 121  HDL Cholesterol >39.00 mg/dL 40.90  LDL (calc) 0 - 99 mg/dL 58  MICROALB/CREAT RATIO 0.0 - 30.0 mg/g 1.0  NonHDL  79.91  Triglycerides 0.0 - 149.0 mg/dL 109.0  VLDL 0.0 - 40.0 mg/dL 21.8    Latest Reference Range & Units 12/02/21 12:13  Creatinine,U mg/dL 80.7   Microalb, Ur 0.0 - 1.9 mg/dL 0.8  MICROALB/CREAT RATIO 0.0 - 30.0 mg/g 1.0      In Office BG 171 mg/dL     ASSESSMENT / PLAN / RECOMMENDATIONS:   1) Type 2 Diabetes Mellitus, Sub-Optimally controlled, With macrovascular and retinopathic  complications - Most recent A1c of 8.9 %. Goal A1c < 7.0 %.      - His A1c has increased from 7.9% to 8.9% despite increasing Jardiance and Trulicity  - Intolerant to Trulicity 4.5 mg  A prescription for Dexcom will be sent to his pharmacy to see if this will be covered We also discussed increasing Jardiance as well as Trulicity I have advised him to avoid bedtime snacks or at least switch to low-carb snacks if possible  MEDICATIONS: Decrease Lantus to 46 units daily Increase Trulicity 4.5 mg weekly Increase Jardiance 25 mg daily Continue metformin 1000 mg twice daily  EDUCATION / INSTRUCTIONS: BG monitoring instructions: Patient is instructed to check his blood sugars 3 times a day, before meals. Call [facility] if: BG persistently < 70  I reviewed the Rule of 15 for the treatment of hypoglycemia in detail with the patient. Literature supplied.   2) Diabetic complications:  Eye: Does  have known diabetic retinopathy.  Neuro/ Feet: Does not have known diabetic peripheral neuropathy. Renal: Patient does not have known baseline CKD. He is on an ACEI/ARB at present.  3) CAD/Dyslipidemia:   -Patient follows with cardiology -His LDL today is at goal  Medication Continue rosuvastatin 10 mg daily    Signed electronically by: Mack Guise, MD  [facility]  [facility] [address] [place], [place] [address] Phone: [phone] FAX: [phone]   CC: Ladell Pier, MD [place]  [address] [place] [address] Phone: [phone]  Fax: [phone]    Return to Endocrinology clinic as below: Future Appointments  Date Time Provider [place]   04/14/2022 11:50 AM Bertie Mcconathy, Melanie Crazier, MD [id number] None  06/13/2022  8:30 AM Ladell Pier, MD [facility] None

## 2022-04-14 NOTE — Patient Instructions (Signed)
Continue  Lantus 46 units daily  Continue  Trulicity 3 mg weekly  Continue Jardiance 25 mg daily  Continue Metformin 1000 mg twice daily     HOW TO TREAT LOW BLOOD SUGARS (Blood sugar LESS THAN 70 MG/DL) Please follow the RULE OF 15 for the treatment of hypoglycemia treatment (when your (blood sugars are less than 70 mg/dL)   STEP 1: Take 15 grams of carbohydrates when your blood sugar is low, which includes:  3-4 GLUCOSE TABS  OR 3-4 OZ OF JUICE OR REGULAR SODA OR ONE TUBE OF GLUCOSE GEL    STEP 2: RECHECK blood sugar in 15 MINUTES STEP 3: If your blood sugar is still low at the 15 minute recheck --> then, go back to STEP 1 and treat AGAIN with another 15 grams of carbohydrates.

## 2022-04-15 ENCOUNTER — Other Ambulatory Visit (HOSPITAL_COMMUNITY): Payer: Self-pay

## 2022-04-16 ENCOUNTER — Telehealth: Payer: Self-pay | Admitting: Pharmacy Technician

## 2022-04-16 ENCOUNTER — Other Ambulatory Visit (HOSPITAL_COMMUNITY): Payer: Self-pay

## 2022-04-16 ENCOUNTER — Encounter: Payer: Self-pay | Admitting: Internal Medicine

## 2022-04-16 NOTE — Telephone Encounter (Signed)
Patient Advocate Encounter   Received notification via fax, from [employer] that prior authorization for [facility] is required/requested. Case # [id number] can be faxed back to [phone]  Since he is not on Prandial insulin or a pump, it asks this question:   Does the patient have diabetes and are they receiving insulin therapy and have a history of severe hypoglycemia episodes requiring medical intervention (grade 2 or higher)? (On the form if I pick no is says STOP, Coverage not approved)  I don't see that he's had any severe hypoglycemic episodes. Can you tell me if I've missed this or if he is on prandial insulin, in addition to his Lantus? Of if there's some other clinical reasoning I can give them that might get this approved?

## 2022-04-17 NOTE — Telephone Encounter (Signed)
Thanks. I'm sorry we can't get this approved for him right now. Let us know if his insulin changes and we'll revisit it.

## 2022-04-30 ENCOUNTER — Other Ambulatory Visit: Payer: Self-pay | Admitting: Internal Medicine

## 2022-04-30 ENCOUNTER — Other Ambulatory Visit: Payer: Self-pay

## 2022-04-30 DIAGNOSIS — Z794 Long term (current) use of insulin: Secondary | ICD-10-CM

## 2022-04-30 MED ORDER — EMPAGLIFLOZIN 25 MG PO TABS: 25.0000 mg | ORAL_TABLET | Freq: Every day | ORAL | 3 refills | Status: DC

## 2022-05-06 LAB — HM DIABETES EYE EXAM

## 2022-05-09 ENCOUNTER — Encounter (HOSPITAL_BASED_OUTPATIENT_CLINIC_OR_DEPARTMENT_OTHER): Payer: Self-pay | Admitting: Emergency Medicine

## 2022-05-09 ENCOUNTER — Emergency Department (HOSPITAL_BASED_OUTPATIENT_CLINIC_OR_DEPARTMENT_OTHER)
Admission: EM | Admit: 2022-05-09 | Discharge: 2022-05-09 | Disposition: A | Discharge status: Home / Self Care | Attending: Emergency Medicine | Admitting: Emergency Medicine

## 2022-05-09 ENCOUNTER — Other Ambulatory Visit: Payer: Self-pay

## 2022-05-09 ENCOUNTER — Ambulatory Visit: Payer: Self-pay

## 2022-05-09 DIAGNOSIS — G510 Bell's palsy: Secondary | ICD-10-CM | POA: Insufficient documentation

## 2022-05-09 DIAGNOSIS — X58XXXA Exposure to other specified factors, initial encounter: Secondary | ICD-10-CM | POA: Insufficient documentation

## 2022-05-09 DIAGNOSIS — S0501XA Injury of conjunctiva and corneal abrasion without foreign body, right eye, initial encounter: Secondary | ICD-10-CM

## 2022-05-09 DIAGNOSIS — E119 Type 2 diabetes mellitus without complications: Secondary | ICD-10-CM | POA: Diagnosis not present

## 2022-05-09 MED ORDER — VALACYCLOVIR HCL 1 G PO TABS: 1000.0000 mg | ORAL_TABLET | Freq: Three times a day (TID) | ORAL | 0 refills | Status: AC

## 2022-05-09 MED ORDER — FLUORESCEIN SODIUM 1 MG OP STRP
1.0000 | ORAL_STRIP | Freq: Once | OPHTHALMIC | Status: AC
  Administered 2022-05-09: 1 via OPHTHALMIC
  Filled 2022-05-09: qty 1

## 2022-05-09 MED ORDER — VALACYCLOVIR HCL 500 MG PO TABS
1000.0000 mg | ORAL_TABLET | Freq: Once | ORAL | Status: AC
  Administered 2022-05-09: 1000 mg via ORAL
  Filled 2022-05-09: qty 2

## 2022-05-09 MED ORDER — TETRACAINE HCL 0.5 % OP SOLN
1.0000 [drp] | Freq: Once | OPHTHALMIC | Status: AC
  Administered 2022-05-09: 1 [drp] via OPHTHALMIC
  Filled 2022-05-09: qty 4

## 2022-05-09 MED ORDER — POLYMYXIN B-TRIMETHOPRIM 10000-0.1 UNIT/ML-% OP SOLN: 1.0000 [drp] | OPHTHALMIC | 0 refills | Status: AC

## 2022-05-09 MED ORDER — PREDNISONE 20 MG PO TABS: 60.0000 mg | ORAL_TABLET | Freq: Every day | ORAL | 0 refills | Status: AC

## 2022-05-09 MED ORDER — SYSTANE BALANCE 0.6 % OP SOLN: 1.0000 [drp] | OPHTHALMIC | 0 refills | Status: DC

## 2022-05-09 MED ORDER — DEXAMETHASONE 4 MG PO TABS
8.0000 mg | ORAL_TABLET | Freq: Once | ORAL | Status: AC
  Administered 2022-05-09: 8 mg via ORAL
  Filled 2022-05-09: qty 2

## 2022-05-09 NOTE — ED Triage Notes (Signed)
Right side facial palsy. Started 2pm yesterday afternoon. Denies any other weakness/stroke like symptoms. Had illness about 1-2 weeks ago

## 2022-05-09 NOTE — ED Provider Notes (Signed)
[facility] EMERGENCY DEPT Provider Note   CSN: [id number] Arrival date & time: 05/09/22  1659     History {Add pertinent medical, surgical, social history, OB history to HPI:1} No chief complaint on file.   Paul [name] is a 51 y.o. male.  Patient as above with significant medical history as below, including *** who presents to the ED with complaint of ***     Past Medical History:  Diagnosis Date  . Diabetes mellitus without complication ([place])   . Fatty liver    abnormal CT 2014  . Heart disease   . Hyperlipidemia   . Kidney stones   . Sleep apnea    wears CPAP    Past Surgical History:  Procedure Laterality Date  . COLONOSCOPY    . CORONARY STENT INTERVENTION N/A 08/26/2019   Procedure: CORONARY STENT INTERVENTION;  Surgeon: Wellington Hampshire, MD;  Location: [facility];  Service: Cardiovascular;  Laterality: N/A;  . KNEE ARTHROSCOPY Right   . LEFT HEART CATH AND CORONARY ANGIOGRAPHY N/A 08/26/2019   Procedure: LEFT HEART CATH AND CORONARY ANGIOGRAPHY;  Surgeon: Wellington Hampshire, MD;  Location: [facility];  Service: Cardiovascular;  Laterality: N/A;  . UMBILICAL HERNIA REPAIR       The history is provided by the patient. No language interpreter was used.       Home Medications Prior to Admission medications   Medication Sig Start Date End Date Taking? Authorizing Provider  predniSONE (DELTASONE) 20 MG tablet Take 3 tablets (60 mg total) by mouth daily for 6 days. 05/10/22 05/16/22 Yes Jeanell Sparrow, DO  Propylene Glycol (SYSTANE BALANCE) 0.6 % SOLN Apply 1-2 drops to eye every hour while awake. 05/09/22  Yes Jeanell Sparrow, DO  trimethoprim-polymyxin b (POLYTRIM) ophthalmic solution Place 1 drop into the right eye every 4 (four) hours for 7 days. 05/09/22 05/16/22 Yes Jeanell Sparrow, DO  valACYclovir (VALTREX) 1000 MG tablet Take 1 tablet (1,000 mg total) by mouth 3 (three) times daily for 6 days. 05/10/22 05/16/22 Yes Jeanell Sparrow, DO  aspirin EC 81 MG tablet Take 1 tablet (81 mg total) by mouth daily. 08/22/19   Sueanne Margarita, MD  clopidogrel (PLAVIX) 75 MG tablet Take 1 tablet (75 mg total) by mouth daily. 12/12/21   Argentina Donovan, PA-C  Continuous Blood Gluc Sensor (FREESTYLE LIBRE 2 SENSOR) MISC 1 Device by Does not apply route every 14 (fourteen) days. 04/14/22   Shamleffer, Melanie Crazier, MD  Dulaglutide (TRULICITY) 3 HB/7.1IR SOPN Inject 3 mg as directed once a week. 02/26/22   Shamleffer, Melanie Crazier, MD  empagliflozin (JARDIANCE) 25 MG TABS tablet Take 1 tablet (25 mg total) by mouth daily before breakfast. 04/30/22   Shamleffer, Melanie Crazier, MD  Homeopathic Products [facility] RELIEF EX) Apply 1 application topically daily as needed (muscle cramps / pains).    [provider]  insulin glargine (LANTUS SOLOSTAR) 100 UNIT/ML Solostar Pen Inject 46 Units into the skin daily. INJECT 46 UNITS UNDER THE SKIN DAILY. Decrease to 44u daily once you start the new Trulicity dose. 04/04/22   Shamleffer, Melanie Crazier, MD  Insulin Pen Needle (B-D UF III MINI PEN NEEDLES) 31G X 5 MM MISC Inject 1 Device into the skin daily in the afternoon. 5 mm 31G 3/15.  Use as directed 12/02/21   Shamleffer, Melanie Crazier, MD  metFORMIN (GLUCOPHAGE) 1000 MG tablet TAKE 1 TABLET TWICE A DAY WITH MEALS 04/30/22   Karle Plumber  B, MD  metoprolol succinate (TOPROL XL) 25 MG 24 hr tablet Take 0.5 tablets (12.5 mg total) by mouth daily. 12/12/21   Argentina Donovan, PA-C  nitroGLYCERIN (NITROSTAT) 0.4 MG SL tablet Place 1 tablet (0.4 mg total) under the tongue every 5 (five) minutes as needed. 08/26/19   Cheryln Manly, NP  omeprazole (PRILOSEC) 20 MG capsule Take 1 capsule (20 mg total) by mouth daily as needed. 04/26/21   Ladell Pier, MD  rosuvastatin (CRESTOR) 10 MG tablet TAKE 1 TABLET(10 MG) BY MOUTH DAILY. 12/12/21   Argentina Donovan, PA-C  sildenafil (VIAGRA) 50 MG tablet Take 50 mg by mouth daily as  needed for erectile dysfunction.    [provider]  Turmeric 500 MG CAPS Take 1,000 mg by mouth every evening.    [provider]      Allergies    Codeine, Crestor [rosuvastatin], Hydrocodone, and Oxycodone hcl    Review of Systems   Review of Systems  Physical Exam Updated Vital Signs BP (!) 127/91 (BP Location: Right Arm)   Pulse 84   Temp 98 F (36.7 C) (Oral)   Resp 16   SpO2 99%  Physical Exam  ED Results / Procedures / Treatments   Labs (all labs ordered are listed, but only abnormal results are displayed) Labs Reviewed - No data to display  EKG None  Radiology No results found.  Procedures Procedures  {Document cardiac monitor, telemetry assessment procedure when appropriate:1}  Medications Ordered in ED Medications  fluorescein ophthalmic strip 1 strip (has no administration in time range)  tetracaine (PONTOCAINE) 0.5 % ophthalmic solution 1 drop (has no administration in time range)  dexamethasone (DECADRON) tablet 8 mg (has no administration in time range)  valACYclovir (VALTREX) tablet 1,000 mg (has no administration in time range)    ED Course/ Medical Decision Making/ A&P                           Medical Decision Making Risk OTC drugs. Prescription drug management.   This patient presents to the ED with chief complaint(s) of *** with pertinent past medical history of *** which further complicates the presenting complaint. The complaint involves an extensive differential diagnosis and also carries with it a high risk of complications and morbidity.    The differential diagnosis includes but not limited to ***. Serious etiologies were considered.   The initial plan is to ***   Additional history obtained: Additional history obtained from {additional history:26846} Records reviewed {records:26847}  Independent labs interpretation:  The following labs were independently interpreted: ***  Independent visualization of  imaging: - I independently visualized the following imaging with scope of interpretation limited to determining acute life threatening conditions related to emergency care: ***, which revealed ***  Cardiac monitoring was reviewed and interpreted by myself which shows ***  Treatment and Reassessment: ***  Consultation: - Consulted or discussed management/test interpretation w/ external professional: ***  Consideration for admission or further workup: Admission was considered ***   The patient presented with unilateral facial weakness and no other neurological deficits which is consistent with Bell's Palsy.  I will prescribe Prednisone to help increase the chance of neurologic recovery but I did review with the patient about the need for taping their eye closed at night and using eye drops for lubrication and that the degree of neurologic recovery if any cannot be predicted.The patient is stable for discharge with treatment for Bell's Palsy.  Social Determinants of health: Social History   Tobacco Use  . Smoking status: Never  . Smokeless tobacco: Never  Vaping Use  . Vaping Use: Never used  Substance Use Topics  . Alcohol use: Yes    Comment: Social  . Drug use: No      {Document critical care time when appropriate:1} {Document review of labs and clinical decision tools ie heart score, Chads2Vasc2 etc:1}  {Document your independent review of radiology images, and any outside records:1} {Document your discussion with family members, caretakers, and with consultants:1} {Document social determinants of health affecting pt's care:1} {Document your decision making why or why not admission, treatments were needed:1} Final Clinical Impression(s) / ED Diagnoses Final diagnoses:  Bell's palsy  Abrasion of right cornea, initial encounter    Rx / DC Orders ED Discharge Orders          Ordered    predniSONE (DELTASONE) 20 MG tablet  Daily        05/09/22 2133    valACYclovir  (VALTREX) 1000 MG tablet  3 times daily        05/09/22 2133    Propylene Glycol (SYSTANE BALANCE) 0.6 % SOLN  Every hour while awake        05/09/22 2133    trimethoprim-polymyxin b (POLYTRIM) ophthalmic solution  Every 4 hours        05/09/22 2133

## 2022-05-09 NOTE — Telephone Encounter (Signed)
  Chief Complaint: numbness Symptoms: drooping of R side of face, numbness on R side of tongue, R eye not closing all the way  Frequency: started yesterday Pertinent Negatives: Patient denies numbness in rest of body or dizziness, or vision changes Disposition: '[x]'$ ED /'[]'$ Urgent Care (no appt availability in office) / '[]'$ Appointment(In office/virtual)/ '[]'$  [facility]/ '[]'$ Home Care/ '[]'$ Refused Recommended Disposition /'[]'$ [place]/ '[]'$  Follow-up with PCP Additional Notes: pt states that he is unsure if he has Bell's Palsy or not but his mother had it years ago. Only has numbness and drooping of the face. I advised pt to go to ED for eval. Pt and wife asked about going to [facility] and advised that [facility] ED would be fine to go to.  Reason for Disposition  Bell's palsy suspected (i.e., weakness on only one side of the face, developing over hours to days, no other symptoms)  Answer Assessment - Initial Assessment Questions 1. SYMPTOM: "What is the main symptom you are concerned about?" (e.g., weakness, numbness)     Numbness and drooping, R eye not closing all the way, mouth on R side different than Left  2. ONSET: "When did this start?" (minutes, hours, days; while sleeping)     Last night  4. PATTERN "Does this come and go, or has it been constant since it started?"  "Is it present now?"     Constant  6. NEUROLOGIC SYMPTOMS: "Have you had any of the following symptoms: headache, dizziness, vision loss, double vision, changes in speech, unsteady on your feet?"     no 7. OTHER SYMPTOMS: "Do you have any other symptoms?"     Tongue feels numbness  Protocols used: Neurologic Deficit-A-AH

## 2022-05-09 NOTE — Discharge Instructions (Addendum)
It was a pleasure caring for you today in the emergency department.  Please return to the emergency department for any worsening or worrisome symptoms.  Please tape your eye shut at night time before going to bed after instilling moisturizing drops (systane) Use re-wetting drops every hour while awake Closely monitor your blood glucose levels at home while on steroids, this may cause your sugars to increase

## 2022-05-10 NOTE — ED Provider Notes (Incomplete)
[facility] EMERGENCY DEPT Provider Note   CSN: [id number] Arrival date & time: 05/09/22  1659     History {Add pertinent medical, surgical, social history, OB history to HPI:1} No chief complaint on file.   Paul [name] is a 51 y.o. male.  Patient as above with significant medical history as below, including DM, hld, osa on cpap who presents to the ED with complaint of facial weakness, facial droop. Mother with hx bell's palsy, pt noticed difficulty closing his right eyelid over the past 24-36 hours, smile appeared asymmetric. Has not improved since the onset. Right eye feels dry but no vision changes, no double vision. No fevers or chills. No sensation or motor changes to his extremities. No hx similar symptoms in the past. No rashes or recent travel, no exposure to insects, no tick exposure.      Past Medical History:  Diagnosis Date  . Diabetes mellitus without complication ([place])   . Fatty liver    abnormal CT 2014  . Heart disease   . Hyperlipidemia   . Kidney stones   . Sleep apnea    wears CPAP    Past Surgical History:  Procedure Laterality Date  . COLONOSCOPY    . CORONARY STENT INTERVENTION N/A 08/26/2019   Procedure: CORONARY STENT INTERVENTION;  Surgeon: Wellington Hampshire, MD;  Location: [facility];  Service: Cardiovascular;  Laterality: N/A;  . KNEE ARTHROSCOPY Right   . LEFT HEART CATH AND CORONARY ANGIOGRAPHY N/A 08/26/2019   Procedure: LEFT HEART CATH AND CORONARY ANGIOGRAPHY;  Surgeon: Wellington Hampshire, MD;  Location: [facility];  Service: Cardiovascular;  Laterality: N/A;  . UMBILICAL HERNIA REPAIR       The history is provided by the patient. No language interpreter was used.       Home Medications Prior to Admission medications   Medication Sig Start Date End Date Taking? Authorizing Provider  predniSONE (DELTASONE) 20 MG tablet Take 3 tablets (60 mg total) by mouth daily for 6 days. 05/10/22 05/16/22 Yes Jeanell Sparrow, DO  Propylene Glycol (SYSTANE BALANCE) 0.6 % SOLN Apply 1-2 drops to eye every hour while awake. 05/09/22  Yes Jeanell Sparrow, DO  trimethoprim-polymyxin b (POLYTRIM) ophthalmic solution Place 1 drop into the right eye every 4 (four) hours for 7 days. 05/09/22 05/16/22 Yes Jeanell Sparrow, DO  valACYclovir (VALTREX) 1000 MG tablet Take 1 tablet (1,000 mg total) by mouth 3 (three) times daily for 6 days. 05/10/22 05/16/22 Yes Jeanell Sparrow, DO  aspirin EC 81 MG tablet Take 1 tablet (81 mg total) by mouth daily. 08/22/19   Sueanne Margarita, MD  clopidogrel (PLAVIX) 75 MG tablet Take 1 tablet (75 mg total) by mouth daily. 12/12/21   Argentina Donovan, PA-C  Continuous Blood Gluc Sensor (FREESTYLE LIBRE 2 SENSOR) MISC 1 Device by Does not apply route every 14 (fourteen) days. 04/14/22   Shamleffer, Melanie Crazier, MD  Dulaglutide (TRULICITY) 3 WU/9.8JX SOPN Inject 3 mg as directed once a week. 02/26/22   Shamleffer, Melanie Crazier, MD  empagliflozin (JARDIANCE) 25 MG TABS tablet Take 1 tablet (25 mg total) by mouth daily before breakfast. 04/30/22   Shamleffer, Melanie Crazier, MD  Homeopathic Products [facility] RELIEF EX) Apply 1 application topically daily as needed (muscle cramps / pains).    [provider]  insulin glargine (LANTUS SOLOSTAR) 100 UNIT/ML Solostar Pen Inject 46 Units into the skin daily. INJECT 46 UNITS UNDER THE SKIN DAILY. Decrease  to 44u daily once you start the new Trulicity dose. 04/04/22   Shamleffer, Melanie Crazier, MD  Insulin Pen Needle (B-D UF III MINI PEN NEEDLES) 31G X 5 MM MISC Inject 1 Device into the skin daily in the afternoon. 5 mm 31G 3/15.  Use as directed 12/02/21   Shamleffer, Melanie Crazier, MD  metFORMIN (GLUCOPHAGE) 1000 MG tablet TAKE 1 TABLET TWICE A DAY WITH MEALS 04/30/22   Ladell Pier, MD  metoprolol succinate (TOPROL XL) 25 MG 24 hr tablet Take 0.5 tablets (12.5 mg total) by mouth daily. 12/12/21   Argentina Donovan, PA-C  nitroGLYCERIN  (NITROSTAT) 0.4 MG SL tablet Place 1 tablet (0.4 mg total) under the tongue every 5 (five) minutes as needed. 08/26/19   Cheryln Manly, NP  omeprazole (PRILOSEC) 20 MG capsule Take 1 capsule (20 mg total) by mouth daily as needed. 04/26/21   Ladell Pier, MD  rosuvastatin (CRESTOR) 10 MG tablet TAKE 1 TABLET(10 MG) BY MOUTH DAILY. 12/12/21   Argentina Donovan, PA-C  sildenafil (VIAGRA) 50 MG tablet Take 50 mg by mouth daily as needed for erectile dysfunction.    [provider]  Turmeric 500 MG CAPS Take 1,000 mg by mouth every evening.    [provider]      Allergies    Codeine, Crestor [rosuvastatin], Hydrocodone, and Oxycodone hcl    Review of Systems   Review of Systems  Constitutional:  Negative for chills and fever.  HENT:  Negative for facial swelling and trouble swallowing.   Eyes:  Positive for itching. Negative for photophobia and visual disturbance.  Respiratory:  Negative for cough and shortness of breath.   Cardiovascular:  Negative for chest pain and palpitations.  Gastrointestinal:  Negative for abdominal pain, nausea and vomiting.  Endocrine: Negative for polydipsia and polyuria.  Genitourinary:  Negative for difficulty urinating and hematuria.  Musculoskeletal:  Negative for gait problem and joint swelling.  Skin:  Negative for pallor and rash.  Neurological:  Positive for facial asymmetry. Negative for syncope and headaches.  Psychiatric/Behavioral:  Negative for agitation and confusion.     Physical Exam Updated Vital Signs BP (!) 127/91 (BP Location: Right Arm)   Pulse 84   Temp 98 F (36.7 C) (Oral)   Resp 16   SpO2 99%  Physical Exam Vitals and nursing note reviewed.  Constitutional:      General: He is not in acute distress.    Appearance: He is well-developed.  HENT:     Head: Normocephalic and atraumatic.     Right Ear: Tympanic membrane, ear canal and external ear normal.     Left Ear: Tympanic membrane, ear canal and  external ear normal.     Ears:     Comments: No lesions, no vesicles     Mouth/Throat:     Mouth: Mucous membranes are moist.  Eyes:     General: Vision grossly intact. Gaze aligned appropriately. No scleral icterus.    Extraocular Movements: Extraocular movements intact.     Pupils: Pupils are equal, round, and reactive to light.      Comments: Right conjunctival injection  Fluorescein uptake as noted above to right eye, no dendritic lesions noted   Cardiovascular:     Rate and Rhythm: Normal rate and regular rhythm.     Pulses: Normal pulses.     Heart sounds: Normal heart sounds.  Pulmonary:     Effort: Pulmonary effort is normal. No respiratory distress.  Breath sounds: Normal breath sounds.  Abdominal:     General: Abdomen is flat.     Palpations: Abdomen is soft.     Tenderness: There is no abdominal tenderness.  Musculoskeletal:        General: Normal range of motion.     Cervical back: Normal range of motion.     Right lower leg: No edema.     Left lower leg: No edema.  Skin:    General: Skin is warm and dry.     Capillary Refill: Capillary refill takes less than 2 seconds.  Neurological:     Mental Status: He is alert and oriented to person, place, and time.     GCS: GCS eye subscore is 4. GCS verbal subscore is 5. GCS motor subscore is 6.     Cranial Nerves: Facial asymmetry present.     Sensory: Sensation is intact.     Motor: Motor function is intact.     Coordination: Coordination is intact.     Gait: Gait is intact.     Comments: Right sided facial palsy, includes forehead  Psychiatric:        Mood and Affect: Mood normal.        Behavior: Behavior normal.     ED Results / Procedures / Treatments   Labs (all labs ordered are listed, but only abnormal results are displayed) Labs Reviewed - No data to display  EKG None  Radiology No results found.  Procedures Procedures  {Document cardiac monitor, telemetry assessment procedure when  appropriate:1}  Medications Ordered in ED Medications  fluorescein ophthalmic strip 1 strip (has no administration in time range)  tetracaine (PONTOCAINE) 0.5 % ophthalmic solution 1 drop (has no administration in time range)  dexamethasone (DECADRON) tablet 8 mg (has no administration in time range)  valACYclovir (VALTREX) tablet 1,000 mg (has no administration in time range)    ED Course/ Medical Decision Making/ A&P                           Medical Decision Making Risk OTC drugs. Prescription drug management.   This patient presents to the ED with chief complaint(s) of facial asymmetry with pertinent past medical history of dm, hld which further complicates the presenting complaint. The complaint involves an extensive differential diagnosis and also carries with it a high risk of complications and morbidity.    The differential diagnosis includes but not limited to bells palsy, cva, tia, zoster, corneal abrasion, etc, . Serious etiologies were considered.     Additional history obtained: Additional history obtained from family Records reviewed {records:26847}  Independent labs interpretation:  The following labs were independently interpreted: ***  Independent visualization of imaging: - I independently visualized the following imaging with scope of interpretation limited to determining acute life threatening conditions related to emergency care: ***, which revealed ***  Cardiac monitoring was reviewed and interpreted by myself which shows ***  Treatment and Reassessment: ***  Consultation: - Consulted or discussed management/test interpretation w/ external professional: ***  Consideration for admission or further workup: Admission was considered ***   The patient presented with unilateral facial weakness and no other neurological deficits which is consistent with Bell's Palsy.  I will prescribe Prednisone to help increase the chance of neurologic recovery but I did  review with the patient about the need for taping their eye closed at night and using eye drops for lubrication and that the degree of neurologic recovery  if any cannot be predicted.The patient is stable for discharge with treatment for Bell's Palsy.   Social Determinants of health: Social History   Tobacco Use  . Smoking status: Never  . Smokeless tobacco: Never  Vaping Use  . Vaping Use: Never used  Substance Use Topics  . Alcohol use: Yes    Comment: Social  . Drug use: No      {Document critical care time when appropriate:1} {Document review of labs and clinical decision tools ie heart score, Chads2Vasc2 etc:1}  {Document your independent review of radiology images, and any outside records:1} {Document your discussion with family members, caretakers, and with consultants:1} {Document social determinants of health affecting pt's care:1} {Document your decision making why or why not admission, treatments were needed:1} Final Clinical Impression(s) / ED Diagnoses Final diagnoses:  Bell's palsy  Abrasion of right cornea, initial encounter    Rx / DC Orders ED Discharge Orders          Ordered    predniSONE (DELTASONE) 20 MG tablet  Daily        05/09/22 2133    valACYclovir (VALTREX) 1000 MG tablet  3 times daily        05/09/22 2133    Propylene Glycol (SYSTANE BALANCE) 0.6 % SOLN  Every hour while awake        05/09/22 2133    trimethoprim-polymyxin b (POLYTRIM) ophthalmic solution  Every 4 hours        05/09/22 2133

## 2022-05-13 ENCOUNTER — Other Ambulatory Visit: Payer: Self-pay | Admitting: Internal Medicine

## 2022-05-13 DIAGNOSIS — Z794 Long term (current) use of insulin: Secondary | ICD-10-CM

## 2022-05-13 NOTE — Telephone Encounter (Signed)
Medication Refill - Medication: Insulin Pen Needle (B-D UF III MINI PEN NEEDLES) 31G X 5   Has the patient contacted their pharmacy? yes (Agent: If no, request that the patient contact the pharmacy for the refill. If patient does not wish to contact the pharmacy document the reason why and proceed with request.) (Agent: If yes, when and what did the pharmacy advise?)contact pcp  Preferred Pharmacy (with phone number or street name):[facility], [place]  Phone: [phone] Fax: [phone]  Has the patient been seen for an appointment in the last year OR does the patient have an upcoming appointment? yes  Agent: Please be advised that RX refills may take up to 3 business days. We ask that you follow-up with your pharmacy.

## 2022-05-14 MED ORDER — BD PEN NEEDLE MINI U/F 31G X 5 MM MISC: 1.0000 | Freq: Every day | 3 refills | Status: DC

## 2022-06-02 ENCOUNTER — Other Ambulatory Visit: Payer: Self-pay | Admitting: Internal Medicine

## 2022-06-13 ENCOUNTER — Ambulatory Visit: Attending: Internal Medicine | Admitting: Internal Medicine

## 2022-06-13 VITALS — BP 120/78 | HR 72 | Temp 98.1°F | Ht 70.0 in | Wt 209.0 lb

## 2022-06-13 DIAGNOSIS — I2510 Atherosclerotic heart disease of native coronary artery without angina pectoris: Secondary | ICD-10-CM

## 2022-06-13 DIAGNOSIS — E1159 Type 2 diabetes mellitus with other circulatory complications: Secondary | ICD-10-CM | POA: Diagnosis not present

## 2022-06-13 DIAGNOSIS — R600 Localized edema: Secondary | ICD-10-CM | POA: Diagnosis not present

## 2022-06-13 DIAGNOSIS — L299 Pruritus, unspecified: Secondary | ICD-10-CM

## 2022-06-13 DIAGNOSIS — Z794 Long term (current) use of insulin: Secondary | ICD-10-CM

## 2022-06-13 DIAGNOSIS — G510 Bell's palsy: Secondary | ICD-10-CM

## 2022-06-13 DIAGNOSIS — Z23 Encounter for immunization: Secondary | ICD-10-CM | POA: Diagnosis not present

## 2022-06-13 DIAGNOSIS — H3500 Unspecified background retinopathy: Secondary | ICD-10-CM

## 2022-06-13 MED ORDER — FUROSEMIDE 20 MG PO TABS: ORAL_TABLET | ORAL | 1 refills | Status: DC

## 2022-06-13 MED ORDER — TRIAMCINOLONE ACETONIDE 0.1 % EX CREA: 1.0000 | TOPICAL_CREAM | Freq: Two times a day (BID) | CUTANEOUS | 0 refills | Status: DC

## 2022-06-13 NOTE — Patient Instructions (Signed)
Please get yourself low the knee and wear them daily while at work.  I have referred you to the ophthalmologist as discussed today.  Please try to exercise 3-5 days a wek for 30-45 minutes as discussed

## 2022-06-13 NOTE — Progress Notes (Signed)
Patient ID: TAVIEN CHESTNUT, male    DOB: [birth year]  MRN: [id number]  CC: Diabetes (Dm f/u. Discuss "slight case of bell's Palsy" /Intermittent itchy, swelling of R & L leg - 3-4 days at a time. Poss due to the prednisone./Yes to flu vax. )   Subjective: Paul [name] is a 51 y.o. male who presents for chronic disease management His concerns today include:  Hx of DM, HL, OSA on CPAP, ED (Viagra through [place]), CAD (3 vessle CAD with 80% mid-LAD s/p DES).     Seen in ER 1 mth ago for RT sided Bell's Palsy symptoms.  Most bothersome symptoms was that he was having problems blinking his right eye and slight asymmetry at the right nasolabial fold.  He was given Valtrex and prednisone. Symptoms have resolved Usually sleeps with earbuds and at times listening to music.  He wonders whether that had anything to do with it.  DM:   Saw Dr. Leonette Monarch in October.  His A1c had increased from 7.9-8.9.  Patient admitted to dietary indiscretions.  Dietary counseling was given.  No changes made in medications.  He continues to take metformin 1 g twice a day, Trulicity 3 mg once a week, Jardiance 25 mg daily and Lantus insulin 46 units daily. Insurance did cover Dexcom. Libre sent but pt has not heard from [facility] as yet. Not checking BS regularly Doing better with eating habits but has not gotten back to exercising as yet.  Last eye exam 1 mth ago at [facility].  Told he has a little bleeding at back of RT.     CAD/HL:  doing well.  No CP, SOB.  Has not had to use SL Nitro.  Reports compliance with taking metoprolol 12.5 mg daily, aspirin, Plavix and Crestor 10 mg daily.  Complains of swelling in the lower legs x 1 year.  Denies any PND orthopnea.  No dyspnea on exertion.  Echo done back in 2021 revealed EF of 55 to 60%.  He tries to limit salt but thinks he can do better with this.  Swelling is worse at the end of his workday.  His job involves a lot of sitting during the day.  Swelling is  much less in the mornings.  He is also noted intermittent itching at times in the calf bilaterally.  He experienced some recently.  No initiating factors.  HM:  due flu and shingles vaccines.  He will take the flu shot today and is agreeable to coming back to get the first shingles vaccine..      Patient Active Problem List   Diagnosis Date Noted   Dropped heart beats 02/27/2022   Diabetes mellitus ([place]) 12/03/2021   Dyslipidemia 12/03/2021   Type 2 diabetes mellitus with hyperglycemia, with long-term current use of insulin ([place]) 12/03/2021   Type 2 diabetes mellitus with retinopathy, with long-term current use of insulin ([place]) 12/03/2021   Acute rhinitis 07/24/2020   Fatty liver 11/24/2019   CAD (coronary artery disease) 09/08/2019   Erectile dysfunction 08/12/2016   Polyp of colon 08/12/2016   Obstructive sleep apnea 04/03/2015   Onychomycosis of right great toe 04/03/2015   Diabetes type 2, uncontrolled 01/25/2014   Dyslipidemia associated with type 2 diabetes mellitus ([place]) 01/25/2014   Skin tag 01/25/2014     Current Outpatient Medications on File Prior to Visit  Medication Sig Dispense Refill   aspirin EC 81 MG tablet Take 1 tablet (81 mg total) by mouth daily. 90 tablet  3   clopidogrel (PLAVIX) 75 MG tablet Take 1 tablet (75 mg total) by mouth daily. 90 tablet 2   Continuous Blood Gluc Sensor (FREESTYLE LIBRE 2 SENSOR) MISC 1 Device by Does not apply route every 14 (fourteen) days. 6 each 3   Dulaglutide (TRULICITY) 3 VZ/5.6LO SOPN Inject 3 mg as directed once a week. 6 mL 2   empagliflozin (JARDIANCE) 25 MG TABS tablet Take 1 tablet (25 mg total) by mouth daily before breakfast. 90 tablet 3   Homeopathic Products (THERAWORX RELIEF EX) Apply 1 application topically daily as needed (muscle cramps / pains).     insulin glargine (LANTUS SOLOSTAR) 100 UNIT/ML Solostar Pen Inject 46 Units into the skin daily. INJECT 46 UNITS UNDER THE SKIN DAILY. Decrease to 44u daily once you start  the new Trulicity dose. 45 mL 3   Insulin Pen Needle (B-D UF III MINI PEN NEEDLES) 31G X 5 MM MISC Inject 1 Device into the skin daily in the afternoon. 5 mm 31G 3/15.  Use as directed 100 each 3   metFORMIN (GLUCOPHAGE) 1000 MG tablet TAKE 1 TABLET TWICE A DAY WITH MEALS 180 tablet 0   metoprolol succinate (TOPROL XL) 25 MG 24 hr tablet Take 0.5 tablets (12.5 mg total) by mouth daily. 90 tablet 1   nitroGLYCERIN (NITROSTAT) 0.4 MG SL tablet Place 1 tablet (0.4 mg total) under the tongue every 5 (five) minutes as needed. 25 tablet 2   omeprazole (PRILOSEC) 20 MG capsule Take 1 capsule (20 mg total) by mouth daily as needed. 30 capsule 3   Propylene Glycol (SYSTANE BALANCE) 0.6 % SOLN Apply 1-2 drops to eye every hour while awake. 15 mL 0   rosuvastatin (CRESTOR) 10 MG tablet TAKE 1 TABLET(10 MG) BY MOUTH DAILY. 90 tablet 1   sildenafil (VIAGRA) 50 MG tablet Take 50 mg by mouth daily as needed for erectile dysfunction.     Turmeric 500 MG CAPS Take 1,000 mg by mouth every evening.     No current facility-administered medications on file prior to visit.    Allergies  Allergen Reactions   Codeine Nausea And Vomiting and Other (See Comments)    headache   Crestor [Rosuvastatin]     Elveated LFTs with Crestor '40mg'$  daily    Hydrocodone Nausea And Vomiting   Oxycodone Hcl Other (See Comments)    Headaches    Social History   Socioeconomic History   Marital status: Married    Spouse name: Not on file   Number of children: 2   Years of education: Not on file   Highest education level: Not on file  Occupational History   Occupation: Animal nutritionist  Tobacco Use   Smoking status: Never   Smokeless tobacco: Never  Vaping Use   Vaping Use: Never used  Substance and Sexual Activity   Alcohol use: Yes    Comment: Social   Drug use: No   Sexual activity: Yes  Other Topics Concern   Not on file  Social History Narrative   Not on file   Social Determinants of Health   Financial  Resource Strain: Not on file  Food Insecurity: Not on file  Transportation Needs: Not on file  Physical Activity: Not on file  Stress: Not on file  Social Connections: Not on file  Intimate Partner Violence: Not on file    Family History  Problem Relation Age of Onset   Hypertension Father    Diabetes Mother    Sleep apnea Other  Heart attack Maternal Grandfather    Heart attack Paternal Grandfather    Cancer Neg Hx    Colon cancer Neg Hx    Esophageal cancer Neg Hx    Stomach cancer Neg Hx    Rectal cancer Neg Hx     Past Surgical History:  Procedure Laterality Date   COLONOSCOPY     CORONARY STENT INTERVENTION N/A 08/26/2019   Procedure: CORONARY STENT INTERVENTION;  Surgeon: Wellington Hampshire, MD;  Location: [facility];  Service: Cardiovascular;  Laterality: N/A;   KNEE ARTHROSCOPY Right    LEFT HEART CATH AND CORONARY ANGIOGRAPHY N/A 08/26/2019   Procedure: LEFT HEART CATH AND CORONARY ANGIOGRAPHY;  Surgeon: Wellington Hampshire, MD;  Location: [facility];  Service: Cardiovascular;  Laterality: N/A;   UMBILICAL HERNIA REPAIR      ROS: Review of Systems Negative except as stated above  PHYSICAL EXAM: BP 120/78   Pulse 72   Temp 98.1 F (36.7 C) (Oral)   Ht '5\' 10"'$  (1.778 m)   Wt 209 lb (94.8 kg)   SpO2 98%   BMI 29.99 kg/m   Physical Exam   General appearance - alert, well appearing, and in no distress Mental status - normal mood, behavior, speech, dress, motor activity, and thought processes Neck - supple, no significant adenopathy Chest - clear to auscultation, no wheezes, rales or rhonchi, symmetric air entry Heart - normal rate, regular rhythm, normal S1, S2, no murmurs, rubs, clicks or gallops Extremities - peripheral pulses normal.  Positive 1+ edema of both lower extremities about midway up the shin down. Neuro: Cranial nerves grossly intact. Diabetic Foot Exam - Simple   Simple Foot Form Diabetic Foot exam was performed with the  following findings: Yes 06/13/2022  1:15 PM  Visual Inspection See comments: Yes Sensation Testing Intact to touch and monofilament testing bilaterally: Yes Pulse Check Posterior Tibialis and Dorsalis pulse intact bilaterally: Yes Comments Some of the toenails are discolored especially the right big toenail.        Latest Ref Rng & Units 01/06/2022    9:37 AM 12/02/2021   12:13 PM 01/24/2021   10:21 AM  CMP  Glucose 70 - 99 mg/dL  76  64   BUN 6 - 23 mg/dL  21  16   Creatinine 0.40 - 1.50 mg/dL  0.84  0.88   Sodium 135 - 145 mEq/L  141  144   Potassium 3.5 - 5.1 mEq/L  4.2  4.2   Chloride 96 - 112 mEq/L  104  106   CO2 19 - 32 mEq/L  29  22   Calcium 8.4 - 10.5 mg/dL  10.1  9.5   Total Protein 6.0 - 8.5 g/dL   6.5   Total Bilirubin 0.0 - 1.2 mg/dL   0.6   Alkaline Phos 44 - 121 IU/L   74   AST 0 - 40 IU/L   20   ALT 0 - 44 IU/L 37   21    Lipid Panel     Component Value Date/Time   CHOL 121 12/02/2021 1213   CHOL 119 01/24/2021 1021   TRIG 109.0 12/02/2021 1213   HDL 40.90 12/02/2021 1213   HDL 36 (L) 01/24/2021 1021   CHOLHDL 3 12/02/2021 1213   VLDL 21.8 12/02/2021 1213   LDLCALC 58 12/02/2021 1213   LDLCALC 63 01/24/2021 1021    CBC    Component Value Date/Time   WBC 8.1 01/24/2021 1021  WBC 10.3 07/04/2019 0251   RBC 5.17 01/24/2021 1021   RBC 4.89 07/04/2019 0251   HGB 15.5 01/24/2021 1021   HCT 46.1 01/24/2021 1021   PLT 191 01/24/2021 1021   MCV 89 01/24/2021 1021   MCH 30.0 01/24/2021 1021   MCH 30.7 07/04/2019 0251   MCHC 33.6 01/24/2021 1021   MCHC 36.3 (H) 07/04/2019 0251   RDW 12.5 01/24/2021 1021   LYMPHSABS 2.0 01/24/2021 1021   MONOABS 0.6 01/25/2014 1541   EOSABS 0.5 (H) 01/24/2021 1021   BASOSABS 0.1 01/24/2021 1021    ASSESSMENT AND PLAN:  1. Bell's palsy Locally for him symptoms have resolved and he has had complete recovery.  2. Type 2 diabetes mellitus with other circulatory complication, with long-term current use of insulin  ([place]) Encouraged him to call his pharmacy to inquire about this CGM libre device as it was sent by his endocrinologist. Encourage healthy eating habits. Encouraged him to get in some form of moderate intensity exercise with goal of about 150 minutes/week. Given the reported findings on eye exam 1 month ago, I recommend that he get seen with an ophthalmologist for further evaluation. - Ambulatory referral to Ophthalmology  3. Edema of both legs I suspect he has some dependent edema.  Recommend purchasing a pair of compression socks and wearing them during his work hours.  Try with low-dose furosemide to take as needed. - Brain natriuretic peptide - furosemide (LASIX) 20 MG tablet; 1 tab PO daily PRN for swelling in legs  Dispense: 30 tablet; Refill: 1  4. Itch of skin - triamcinolone cream (KENALOG) 0.1 %; Apply 1 Application topically 2 (two) times daily.  Dispense: 30 g; Refill: 0  5. Retinopathy Given his reported findings on recent eye exam by optometrist, I recommend referral to an ophthalmologist. - Ambulatory referral to Ophthalmology  6. Coronary artery disease involving native coronary artery of native heart without angina pectoris Stable.  Continue Crestor, aspirin, Plavix and metoprolol  7. Need for immunization against influenza - Flu Vaccine QUAD 80moIM (Fluarix, Fluzone & Alfiuria Quad PF)    Patient was given the opportunity to ask questions.  Patient verbalized understanding of the plan and was able to repeat key elements of the plan.   This documentation was completed using DRadio producer  Any transcriptional errors are unintentional.  Orders Placed This Encounter  Procedures   Flu Vaccine QUAD 624moM (Fluarix, Fluzone & Alfiuria Quad PF)   Brain natriuretic peptide   Ambulatory referral to Ophthalmology     Requested Prescriptions   Signed Prescriptions Disp Refills   triamcinolone cream (KENALOG) 0.1 % 30 g 0    Sig: Apply 1  Application topically 2 (two) times daily.   furosemide (LASIX) 20 MG tablet 30 tablet 1    Sig: 1 tab PO daily PRN for swelling in legs    Return in about 6 months (around 12/13/2022).  DeKarle PlumberMD, FACP

## 2022-06-14 LAB — BRAIN NATRIURETIC PEPTIDE: BNP: 8.2 pg/mL (ref 0.0–100.0)

## 2022-08-05 ENCOUNTER — Encounter: Payer: Self-pay | Admitting: Internal Medicine

## 2022-08-05 ENCOUNTER — Other Ambulatory Visit: Payer: Self-pay | Admitting: Internal Medicine

## 2022-08-05 ENCOUNTER — Other Ambulatory Visit: Payer: Self-pay | Admitting: Physician Assistant

## 2022-08-05 DIAGNOSIS — I2510 Atherosclerotic heart disease of native coronary artery without angina pectoris: Secondary | ICD-10-CM

## 2022-08-05 DIAGNOSIS — E1169 Type 2 diabetes mellitus with other specified complication: Secondary | ICD-10-CM

## 2022-08-05 DIAGNOSIS — Z794 Long term (current) use of insulin: Secondary | ICD-10-CM

## 2022-08-05 MED ORDER — ROSUVASTATIN CALCIUM 10 MG PO TABS: ORAL_TABLET | ORAL | 1 refills | Status: DC

## 2022-08-05 MED ORDER — TRULICITY 3 MG/0.5ML ~~LOC~~ SOAJ: 3.0000 mg | SUBCUTANEOUS | 2 refills | Status: DC

## 2022-08-05 MED ORDER — METFORMIN HCL 1000 MG PO TABS: 1000.0000 mg | ORAL_TABLET | Freq: Two times a day (BID) | ORAL | 1 refills | Status: DC

## 2022-08-06 NOTE — Telephone Encounter (Signed)
Unable to refill per protocol, Rx request is too soon. Last refill 08/05/22 for 90 days. Duplicate request.  Requested Prescriptions  Pending Prescriptions Disp Refills   rosuvastatin (CRESTOR) 10 MG tablet [Pharmacy Med Name: ROSUVASTATIN TABS '10MG'$ ] 90 tablet 3    Sig: TAKE 1 TABLET DAILY     Cardiovascular:  Antilipid - Statins 2 Failed - 08/05/2022  1:22 PM      Failed - Lipid Panel in normal range within the last 12 months    Cholesterol, Total  Date Value Ref Range Status  01/24/2021 119 100 - 199 mg/dL Final   Cholesterol  Date Value Ref Range Status  12/02/2021 121 0 - 200 mg/dL Final    Comment:    ATP III Classification       Desirable:  < 200 mg/dL               Borderline High:  200 - 239 mg/dL          High:  > = 240 mg/dL   LDL Chol Calc (NIH)  Date Value Ref Range Status  01/24/2021 63 0 - 99 mg/dL Final   LDL Cholesterol  Date Value Ref Range Status  12/02/2021 58 0 - 99 mg/dL Final   HDL  Date Value Ref Range Status  12/02/2021 40.90 >39.00 mg/dL Final  01/24/2021 36 (L) >39 mg/dL Final   Triglycerides  Date Value Ref Range Status  12/02/2021 109.0 0.0 - 149.0 mg/dL Final    Comment:    Normal:  <150 mg/dLBorderline High:  150 - 199 mg/dL         Passed - Cr in normal range and within 360 days    Creat  Date Value Ref Range Status  08/12/2016 0.84 0.60 - 1.35 mg/dL Final   Creatinine, Ser  Date Value Ref Range Status  12/02/2021 0.84 0.40 - 1.50 mg/dL Final   Creatinine,U  Date Value Ref Range Status  12/02/2021 80.7 mg/dL Final   Creatinine, Urine  Date Value Ref Range Status  08/12/2016 61 20 - 370 mg/dL Final         Passed - Patient is not pregnant      Passed - Valid encounter within last 12 months    Recent Outpatient Visits           1 month ago Bell's palsy   [place], MD   5 months ago Nausea and vomiting, unspecified vomiting type   [facility], Cari S, PA-C   7 months ago Type 2 diabetes mellitus with hyperglycemia, with long-term current use of insulin [facility])   [place], [place], [place]   1 year ago Type 2 diabetes mellitus with other circulatory complication, with long-term current use of insulin [facility])   [place] Karle Plumber B, MD   1 year ago Type 2 diabetes mellitus with other circulatory complication, with long-term current use of insulin [facility])   [place], Stephen L, RPH-CPP       Future Appointments             In 4 months Ladell Pier, MD [place]

## 2022-08-06 NOTE — Telephone Encounter (Signed)
Unable to refill per protocol, Rx request is too soon. Last refill 08/05/22 for 90 days.   Requested Prescriptions  Pending Prescriptions Disp Refills   metFORMIN (GLUCOPHAGE) 1000 MG tablet [Pharmacy Med Name: METFORMIN HCL TABS '1000MG'$ ] 180 tablet 3    Sig: TAKE 1 TABLET TWICE A DAY WITH MEALS     Endocrinology:  Diabetes - Biguanides Failed - 08/05/2022  1:22 PM      Failed - HBA1C is between 0 and 7.9 and within 180 days    Hemoglobin A1C  Date Value Ref Range Status  04/14/2022 8.9 (A) 4.0 - 5.6 % Final   HbA1c, POC (controlled diabetic range)  Date Value Ref Range Status  04/26/2021 8.3 (A) 0.0 - 7.0 % Final         Failed - B12 Level in normal range and within 720 days    No results found for: "VITAMINB12"       Failed - CBC within normal limits and completed in the last 12 months    WBC  Date Value Ref Range Status  01/24/2021 8.1 3.4 - 10.8 x10E3/uL Final  07/04/2019 10.3 4.0 - 10.5 K/uL Final   RBC  Date Value Ref Range Status  01/24/2021 5.17 4.14 - 5.80 x10E6/uL Final  07/04/2019 4.89 4.22 - 5.81 MIL/uL Final   Hemoglobin  Date Value Ref Range Status  01/24/2021 15.5 13.0 - 17.7 g/dL Final   Hematocrit  Date Value Ref Range Status  01/24/2021 46.1 37.5 - 51.0 % Final   MCHC  Date Value Ref Range Status  01/24/2021 33.6 31.5 - 35.7 g/dL Final  07/04/2019 36.3 (H) 30.0 - 36.0 g/dL Final   [facility]  Date Value Ref Range Status  01/24/2021 30.0 26.6 - 33.0 pg Final  07/04/2019 30.7 26.0 - 34.0 pg Final   MCV  Date Value Ref Range Status  01/24/2021 89 79 - 97 fL Final   No results found for: "PLTCOUNTKUC", "LABPLAT", "POCPLA" RDW  Date Value Ref Range Status  01/24/2021 12.5 11.6 - 15.4 % Final         Passed - Cr in normal range and within 360 days    Creat  Date Value Ref Range Status  08/12/2016 0.84 0.60 - 1.35 mg/dL Final   Creatinine, Ser  Date Value Ref Range Status  12/02/2021 0.84 0.40 - 1.50 mg/dL Final   Creatinine,U  Date Value Ref  Range Status  12/02/2021 80.7 mg/dL Final   Creatinine, Urine  Date Value Ref Range Status  08/12/2016 61 20 - 370 mg/dL Final         Passed - eGFR in normal range and within 360 days    GFR, Est African American  Date Value Ref Range Status  08/12/2016 >89 >=60 mL/min Final   GFR calc Af Amer  Date Value Ref Range Status  06/19/2020 119 >59 mL/min/1.73 Final    Comment:    **In accordance with recommendations from the NKF-ASN Task force,**   [facility] is in the process of updating its eGFR calculation to the   2021 CKD-EPI creatinine equation that estimates kidney function   without a race variable.    GFR, Est Non African American  Date Value Ref Range Status  08/12/2016 >89 >=60 mL/min Final   GFR calc non Af Amer  Date Value Ref Range Status  06/19/2020 103 >59 mL/min/1.73 Final   GFR  Date Value Ref Range Status  12/02/2021 100.19 >60.00 mL/min Final    Comment:  Calculated using the CKD-EPI Creatinine Equation (2021)   eGFR  Date Value Ref Range Status  01/24/2021 104 >59 mL/min/1.73 Final         Passed - Valid encounter within last 6 months    Recent Outpatient Visits           1 month ago Bell's palsy   [place], MD   5 months ago Nausea and vomiting, unspecified vomiting type   [facility], Cari S, PA-C   7 months ago Type 2 diabetes mellitus with hyperglycemia, with long-term current use of insulin [facility])   [place], [place], [place]   1 year ago Type 2 diabetes mellitus with other circulatory complication, with long-term current use of insulin [facility])   [place] Karle Plumber B, MD   1 year ago Type 2 diabetes mellitus with other circulatory complication, with long-term current use of insulin [facility])   [place],  RPH-CPP       Future Appointments             In 4 months Wynetta Emery, Dalbert Batman, MD [place]

## 2022-08-21 ENCOUNTER — Ambulatory Visit: Admitting: Internal Medicine

## 2022-08-21 ENCOUNTER — Encounter: Payer: Self-pay | Admitting: Internal Medicine

## 2022-08-21 VITALS — BP 122/78 | HR 65 | Ht 70.0 in | Wt 216.8 lb

## 2022-08-21 DIAGNOSIS — E1159 Type 2 diabetes mellitus with other circulatory complications: Secondary | ICD-10-CM

## 2022-08-21 DIAGNOSIS — Z794 Long term (current) use of insulin: Secondary | ICD-10-CM

## 2022-08-21 DIAGNOSIS — E1165 Type 2 diabetes mellitus with hyperglycemia: Secondary | ICD-10-CM

## 2022-08-21 LAB — POCT GLUCOSE (DEVICE FOR HOME USE): POC Glucose: 98 mg/dl (ref 70–99)

## 2022-08-21 LAB — POCT GLYCOSYLATED HEMOGLOBIN (HGB A1C): Hemoglobin A1C: 8.7 % — AB (ref 4.0–5.6)

## 2022-08-21 MED ORDER — GLIPIZIDE 5 MG PO TABS: 5.0000 mg | ORAL_TABLET | Freq: Two times a day (BID) | ORAL | 3 refills | Status: DC

## 2022-08-21 MED ORDER — LANTUS SOLOSTAR 100 UNIT/ML ~~LOC~~ SOPN: 40.0000 [IU] | PEN_INJECTOR | Freq: Every day | SUBCUTANEOUS | 3 refills | Status: DC

## 2022-08-21 MED ORDER — TRULICITY 1.5 MG/0.5ML ~~LOC~~ SOAJ: 1.5000 mg | SUBCUTANEOUS | 3 refills | Status: DC

## 2022-08-21 MED ORDER — BD PEN NEEDLE MINI U/F 31G X 5 MM MISC: 1.0000 | Freq: Every day | 3 refills | Status: DC

## 2022-08-21 MED ORDER — METFORMIN HCL 1000 MG PO TABS: 1000.0000 mg | ORAL_TABLET | Freq: Two times a day (BID) | ORAL | 3 refills | Status: DC

## 2022-08-21 MED ORDER — EMPAGLIFLOZIN 25 MG PO TABS: 25.0000 mg | ORAL_TABLET | Freq: Every day | ORAL | 3 refills | Status: DC

## 2022-08-21 NOTE — Progress Notes (Signed)
Name: Paul [name]  MRN/ DOB: [id number], 25-Apr-1971   Age/ Sex: 53 y.o., male    PCP: Ladell Pier, MD   Reason for Endocrinology Evaluation: Type 2 Diabetes Mellitus     Date of Initial Endocrinology Visit: 12/02/2021    PATIENT IDENTIFIER: [name] is a 52 y.o. male with a past medical history of DM, CAD, HTN and dyslipidemia, OSA on CPAP . The patient presented for initial endocrinology clinic visit on 12/02/2021  for consultative assistance with his diabetes management.    HPI: [name] was    Diagnosed with DM 2019 Hemoglobin A1c has ranged from 8.3% in 2022, peaking at 9.9% in 2019.  On his initial visit to our clinic his A1c was 7.9%, he was on metformin, Trulicity, Jardiance, and Lantus. We increased Trulicity and Jardiance, continued metformin and basal insulin   SUBJECTIVE:   During the last visit (04/14/2022): A1c 8.9%    Today (08/21/22): [name] is here for a follow up on diabetes management. He checks his blood sugars occasionally . The patient has not had hypoglycemic episodes since the last clinic visit.   Patient presented to the ED 05/09/2022 for Bell's palsy Insurance did not cover freestyle Elenor Legato  He has been without Trulicity for ~ a month  ,endorses indigestion with Trulicity that has resolved while being off  of it  Denies diarrhea  Has intermittent pruritus over the [place]: Metformin 123XX123 mg BID Trulicity 3 mg weekly ( Monday)  Jardiance 25 mg daily  Lantus 46 units daily     Statin: yes ACE-I/ARB: no    METER DOWNLOAD SUMMARY: did not bring     DIABETIC COMPLICATIONS: Microvascular complications:  Retinopathy (mild)  Denies: CKD, neuropathy  Last eye exam: Completed 10/2021  Macrovascular complications:  CAD (S/P PCI) Denies: PVD, CVA   PAST HISTORY: Past Medical History:  Past Medical History:  Diagnosis Date   Diabetes mellitus without complication ([place])    Fatty liver     abnormal CT 2014   Heart disease    Hyperlipidemia    Kidney stones    Sleep apnea    wears CPAP   Past Surgical History:  Past Surgical History:  Procedure Laterality Date   COLONOSCOPY     CORONARY STENT INTERVENTION N/A 08/26/2019   Procedure: CORONARY STENT INTERVENTION;  Surgeon: Wellington Hampshire, MD;  Location: [facility];  Service: Cardiovascular;  Laterality: N/A;   KNEE ARTHROSCOPY Right    LEFT HEART CATH AND CORONARY ANGIOGRAPHY N/A 08/26/2019   Procedure: LEFT HEART CATH AND CORONARY ANGIOGRAPHY;  Surgeon: Wellington Hampshire, MD;  Location: [facility];  Service: Cardiovascular;  Laterality: N/A;   UMBILICAL HERNIA REPAIR      Social History:  reports that he has never smoked. He has never used smokeless tobacco. He reports current alcohol use. He reports that he does not use drugs. Family History:  Family History  Problem Relation Age of Onset   Hypertension Father    Diabetes Mother    Sleep apnea Other    Heart attack Maternal Grandfather    Heart attack Paternal Grandfather    Cancer Neg Hx    Colon cancer Neg Hx    Esophageal cancer Neg Hx    Stomach cancer Neg Hx    Rectal cancer Neg Hx      HOME MEDICATIONS: Allergies as of 08/21/2022       Reactions   Codeine  Nausea And Vomiting, Other (See Comments)   headache   Crestor [rosuvastatin]    Elveated LFTs with Crestor 54m daily   Hydrocodone Nausea And Vomiting   Oxycodone Hcl Other (See Comments)   Headaches        Medication List        Accurate as of August 21, 2022 10:57 AM. If you have any questions, ask your nurse or doctor.          aspirin EC 81 MG tablet Take 1 tablet (81 mg total) by mouth daily.   B-D UF III MINI PEN NEEDLES 31G X 5 MM Misc Generic drug: Insulin Pen Needle Inject 1 Device into the skin daily in the afternoon. 5 mm 31G 3/15.  Use as directed   clopidogrel 75 MG tablet Commonly known as: PLAVIX Take 1 tablet (75 mg total) by mouth  daily.   empagliflozin 25 MG Tabs tablet Commonly known as: JARDIANCE Take 1 tablet (25 mg total) by mouth daily before breakfast.   FreeStyle Libre 2 Sensor Misc 1 Device by Does not apply route every 14 (fourteen) days.   furosemide 20 MG tablet Commonly known as: LASIX 1 tab PO daily PRN for swelling in legs   Lantus SoloStar 100 UNIT/ML Solostar Pen Generic drug: insulin glargine Inject 46 Units into the skin daily. INJECT 46 UNITS UNDER THE SKIN DAILY. Decrease to 44u daily once you start the new Trulicity dose.   metFORMIN 1000 MG tablet Commonly known as: GLUCOPHAGE Take 1 tablet (1,000 mg total) by mouth 2 (two) times daily with a meal.   metoprolol succinate 25 MG 24 hr tablet Commonly known as: Toprol XL Take 0.5 tablets (12.5 mg total) by mouth daily.   nitroGLYCERIN 0.4 MG SL tablet Commonly known as: Nitrostat Place 1 tablet (0.4 mg total) under the tongue every 5 (five) minutes as needed.   omeprazole 20 MG capsule Commonly known as: PRILOSEC Take 1 capsule (20 mg total) by mouth daily as needed.   rosuvastatin 10 MG tablet Commonly known as: CRESTOR TAKE 1 TABLET(10 MG) BY MOUTH DAILY.   sildenafil 50 MG tablet Commonly known as: VIAGRA Take 50 mg by mouth daily as needed for erectile dysfunction.   Systane Balance 0.6 % Soln Generic drug: Propylene Glycol Apply 1-2 drops to eye every hour while awake.   THERAWORX RELIEF EX Apply 1 application topically daily as needed (muscle cramps / pains).   triamcinolone cream 0.1 % Commonly known as: KENALOG Apply 1 Application topically 2 (two) times daily.   Trulicity 3 M0000000Sopn Generic drug: Dulaglutide Inject 3 mg as directed once a week.   Turmeric 500 MG Caps Take 1,000 mg by mouth every evening.         ALLERGIES: Allergies  Allergen Reactions   Codeine Nausea And Vomiting and Other (See Comments)    headache   Crestor [Rosuvastatin]     Elveated LFTs with Crestor 462mdaily     Hydrocodone Nausea And Vomiting   Oxycodone Hcl Other (See Comments)    Headaches     REVIEW OF SYSTEMS: A comprehensive ROS was conducted with the patient and is negative except as per HPI    OBJECTIVE:   VITAL SIGNS: BP 122/78 (BP Location: Left Arm, Patient Position: Sitting, Cuff Size: Large)   Pulse 65   Ht 5' 10"$  (1.778 m)   Wt 216 lb 12.8 oz (98.3 kg)   SpO2 99%   BMI 31.11 kg/m    PHYSICAL EXAM:  General: Pt appears well and is in NAD  Neck: General: Supple without adenopathy or carotid bruits. Thyroid: Thyroid size normal.  No goiter or nodules appreciated.   Lungs: Clear with good BS bilat with no rales, rhonchi, or wheezes  Heart: RRR   Abdomen:  soft, nontender, without masses or organomegaly palpable  Extremities:  Lower extremities -trace pretibial edema  Neuro: MS is good with appropriate affect, pt is alert and Ox3    DM foot exam: 12/02/2021  The skin of the feet is intact without sores or ulcerations, right great toe nail discoloration  The pedal pulses are 2+ on right and 2+ on left. The sensation is intact to a screening 5.07, 10 gram monofilament bilaterally   DATA REVIEWED:  Lab Results  Component Value Date   HGBA1C 8.9 (A) 04/14/2022   HGBA1C 7.9 (A) 12/02/2021   HGBA1C 8.3 (A) 04/26/2021    Latest Reference Range & Units 12/02/21 12:13  Sodium 135 - 145 mEq/L 141  Potassium 3.5 - 5.1 mEq/L 4.2  Chloride 96 - 112 mEq/L 104  CO2 19 - 32 mEq/L 29  Glucose 70 - 99 mg/dL 76  BUN 6 - 23 mg/dL 21  Creatinine 0.40 - 1.50 mg/dL 0.84  Calcium 8.4 - 10.5 mg/dL 10.1  GFR >60.00 mL/min 100.19    Latest Reference Range & Units 12/02/21 12:13  Total CHOL/HDL Ratio  3  Cholesterol 0 - 200 mg/dL 121  HDL Cholesterol >39.00 mg/dL 40.90  LDL (calc) 0 - 99 mg/dL 58  MICROALB/CREAT RATIO 0.0 - 30.0 mg/g 1.0  NonHDL  79.91  Triglycerides 0.0 - 149.0 mg/dL 109.0  VLDL 0.0 - 40.0 mg/dL 21.8    Latest Reference Range & Units 12/02/21 12:13   Creatinine,U mg/dL 80.7  Microalb, Ur 0.0 - 1.9 mg/dL 0.8  MICROALB/CREAT RATIO 0.0 - 30.0 mg/g 1.0      In Office BG 98 mg/dL     ASSESSMENT / PLAN / RECOMMENDATIONS:   1) Type 2 Diabetes Mellitus, Poorly  controlled, With macrovascular and retinopathic  complications - Most recent A1c of 8.7 %. Goal A1c < 7.0 %.     -Patient continues with hyperglycemia -No glucose data, I have again encouraged the patient to check glucose at home [employer] has not covered Dexcom nor freestyle libre - Intolerant to [employer] 4.5 mg and 3 mg -I have recommended starting glipizide as below, emphasized importance of taking it before first and last meal of the day, cautioned about the increased risk of hypoglycemia with combination of insulin, but the benefit outweighs the risk at this time due to persistent hyperglycemia -I will reduce Lantus to prevent hypoglycemia -Not a candidate for pioglitazone due to lower extremity edema   MEDICATIONS: Start glipizide 5 mg, twice daily Decrease Lantus  40 units daily Decrease Trulicity 1.5 mg weekly Continue Jardiance 25 mg daily Continue metformin 1000 mg twice daily  EDUCATION / INSTRUCTIONS: BG monitoring instructions: Patient is instructed to check his blood sugars 3 times a day, before meals. Call [facility] if: BG persistently < 70  I reviewed the Rule of 15 for the treatment of hypoglycemia in detail with the patient. Literature supplied.   2) Diabetic complications:  Eye: Does  have known diabetic retinopathy.  Neuro/ Feet: Does not have known diabetic peripheral neuropathy. Renal: Patient does not have known baseline CKD. He is on an ACEI/ARB at present.  3) CAD/Dyslipidemia:   -Patient follows with cardiology  F/U in 4 months  Signed electronically by: Mack Guise, MD  [facility] Endocrinology  [facility] [address] [place], [place] [address] Phone:  [phone] FAX: [phone]   CC: Ladell Pier, MD [address] [place] [address] Phone: [phone]  Fax: [phone]    Return to Endocrinology clinic as below: Future Appointments  Date Time Provider [place]  08/21/2022 11:10 AM Dylyn Mclaren, Melanie Crazier, MD [id number] None  12/05/2022  8:30 AM Ladell Pier, MD [facility] None

## 2022-08-21 NOTE — Patient Instructions (Addendum)
Start Glipizide 5 mg, 1 tablet before Breakfast and 1 tablet before Supper  Decrease   Lantus 40 units daily  Decrease  Trulicity 1.5 mg weekly  Continue Jardiance 25 mg daily  Continue Metformin 1000 mg twice daily     HOW TO TREAT LOW BLOOD SUGARS (Blood sugar LESS THAN 70 MG/DL) Please follow the RULE OF 15 for the treatment of hypoglycemia treatment (when your (blood sugars are less than 70 mg/dL)   STEP 1: Take 15 grams of carbohydrates when your blood sugar is low, which includes:  3-4 GLUCOSE TABS  OR 3-4 OZ OF JUICE OR REGULAR SODA OR ONE TUBE OF GLUCOSE GEL    STEP 2: RECHECK blood sugar in 15 MINUTES STEP 3: If your blood sugar is still low at the 15 minute recheck --> then, go back to STEP 1 and treat AGAIN with another 15 grams of carbohydrates.

## 2022-08-22 ENCOUNTER — Other Ambulatory Visit: Payer: Self-pay | Admitting: Internal Medicine

## 2022-08-22 ENCOUNTER — Encounter: Payer: Self-pay | Admitting: Internal Medicine

## 2022-08-22 DIAGNOSIS — E785 Hyperlipidemia, unspecified: Secondary | ICD-10-CM

## 2022-08-22 DIAGNOSIS — I2510 Atherosclerotic heart disease of native coronary artery without angina pectoris: Secondary | ICD-10-CM

## 2022-08-22 MED ORDER — ROSUVASTATIN CALCIUM 10 MG PO TABS: ORAL_TABLET | ORAL | 1 refills | Status: DC

## 2022-09-29 ENCOUNTER — Emergency Department (HOSPITAL_COMMUNITY)
Admission: EM | Admit: 2022-09-29 | Discharge: 2022-09-29 | Disposition: A | Discharge status: Home / Self Care | Attending: Emergency Medicine | Admitting: Emergency Medicine

## 2022-09-29 ENCOUNTER — Emergency Department (HOSPITAL_COMMUNITY)

## 2022-09-29 ENCOUNTER — Other Ambulatory Visit: Payer: Self-pay

## 2022-09-29 DIAGNOSIS — Z794 Long term (current) use of insulin: Secondary | ICD-10-CM | POA: Insufficient documentation

## 2022-09-29 DIAGNOSIS — Z79899 Other long term (current) drug therapy: Secondary | ICD-10-CM | POA: Insufficient documentation

## 2022-09-29 DIAGNOSIS — Z7984 Long term (current) use of oral hypoglycemic drugs: Secondary | ICD-10-CM | POA: Diagnosis not present

## 2022-09-29 DIAGNOSIS — E119 Type 2 diabetes mellitus without complications: Secondary | ICD-10-CM | POA: Insufficient documentation

## 2022-09-29 DIAGNOSIS — Z7902 Long term (current) use of antithrombotics/antiplatelets: Secondary | ICD-10-CM | POA: Diagnosis not present

## 2022-09-29 DIAGNOSIS — R55 Syncope and collapse: Secondary | ICD-10-CM | POA: Diagnosis not present

## 2022-09-29 DIAGNOSIS — Z7982 Long term (current) use of aspirin: Secondary | ICD-10-CM | POA: Diagnosis not present

## 2022-09-29 DIAGNOSIS — I2510 Atherosclerotic heart disease of native coronary artery without angina pectoris: Secondary | ICD-10-CM | POA: Diagnosis not present

## 2022-09-29 LAB — COMPREHENSIVE METABOLIC PANEL
ALT: 32 U/L (ref 0–44)
AST: 25 U/L (ref 15–41)
Albumin: 4.1 g/dL (ref 3.5–5.0)
Alkaline Phosphatase: 74 U/L (ref 38–126)
Anion gap: 12 (ref 5–15)
BUN: 18 mg/dL (ref 6–20)
CO2: 24 mmol/L (ref 22–32)
Calcium: 9.3 mg/dL (ref 8.9–10.3)
Chloride: 104 mmol/L (ref 98–111)
Creatinine, Ser: 0.95 mg/dL (ref 0.61–1.24)
GFR, Estimated: >60 mL/min (ref >60)
Glucose, Bld: 201 mg/dL — ABNORMAL HIGH (ref 70–99)
Potassium: 4 mmol/L (ref 3.5–5.1)
Sodium: 140 mmol/L (ref 135–145)
Total Bilirubin: 0.8 mg/dL (ref 0.3–1.2)
Total Protein: 6.7 g/dL (ref 6.5–8.1)

## 2022-09-29 LAB — CBC WITH DIFFERENTIAL/PLATELET
Abs Immature Granulocytes: 0.04 10*3/uL (ref 0.00–0.07)
Basophils Absolute: 0.1 10*3/uL (ref 0.0–0.1)
Basophils Relative: 1 %
Eosinophils Absolute: 0.4 10*3/uL (ref 0.0–0.5)
Eosinophils Relative: 5 %
HCT: 45.4 % (ref 39.0–52.0)
Hemoglobin: 16.1 g/dL (ref 13.0–17.0)
Immature Granulocytes: 1 %
Lymphocytes Relative: 18 %
Lymphs Abs: 1.4 10*3/uL (ref 0.7–4.0)
MCH: 30.4 pg (ref 26.0–34.0)
MCHC: 35.5 g/dL (ref 30.0–36.0)
MCV: 85.8 fL (ref 80.0–100.0)
Monocytes Absolute: 0.7 10*3/uL (ref 0.1–1.0)
Monocytes Relative: 9 %
Neutro Abs: 4.9 10*3/uL (ref 1.7–7.7)
Neutrophils Relative %: 66 %
Platelets: 220 10*3/uL (ref 150–400)
RBC: 5.29 MIL/uL (ref 4.22–5.81)
RDW: 12.3 % (ref 11.5–15.5)
WBC: 7.4 10*3/uL (ref 4.0–10.5)
nRBC: 0 % (ref 0.0–0.2)

## 2022-09-29 LAB — TROPONIN I (HIGH SENSITIVITY)
Troponin I (High Sensitivity): 3 ng/L (ref <18)
Troponin I (High Sensitivity): 3 ng/L (ref <18)

## 2022-09-29 LAB — D-DIMER, QUANTITATIVE: D-Dimer, Quant: <0.27 ug/mL-FEU (ref 0.00–0.50)

## 2022-09-29 NOTE — Discharge Instructions (Addendum)
Your EKG, chest ray laboratory evaluation was reassuring.  Cardiology recommends that you follow-up in clinic urgently for consideration for cardiac monitor placement and repeat evaluation.

## 2022-09-29 NOTE — ED Provider Notes (Signed)
[facility] Provider Note   CSN: [id number] Arrival date & time: 09/29/22  0543     History  Chief Complaint  Patient presents with   Lightheaded/Near Syncope    Paul [name] is a 52 y.o. male.  HPI   52 year old male with medical history significant for DM 2, HLD, OSA, CAD status post stent placement during left heart catheterization in 2021, follows outpatient with Dr. Radford Pax of cardiology who presents to the emergency department after a near syncopal episode.  The patient states that he woke up this morning and had an episode of lightheadedness, profound diaphoresis and lasting several minutes.  He had a recurrence of symptoms without a prodrome while sitting at the kitchen table without a change in his posture.  He denied any chest pain but appeared to have mild shortness of breath.  He was near syncopal during both episodes but did not fully pass out.  Since then, he has been chest pain free, had no further episodes.  No numbness, weakness, facial droop, no seizure-like activity.  He checked his blood sugar at the time and it was normal.  While he denied chest pain, he thought maybe his chest was tight during the episode.  He states that he had a similar presentation prior to him ultimately undergoing coronary CT and a blockage discovered in his heart requiring stent placement and he is concerned that he is having worsening coronary artery disease.  His diabetes and CAD is medically managed.     Home Medications Prior to Admission medications   Medication Sig Start Date End Date Taking? Authorizing Provider  aspirin EC 81 MG tablet Take 1 tablet (81 mg total) by mouth daily. 08/22/19   Sueanne Margarita, MD  clopidogrel (PLAVIX) 75 MG tablet Take 1 tablet (75 mg total) by mouth daily. 12/12/21   Argentina Donovan, PA-C  Dulaglutide (TRULICITY) 1.5 0000000 SOPN Inject 1.5 mg into the skin once a week. 08/21/22   Shamleffer, Melanie Crazier,  MD  empagliflozin (JARDIANCE) 25 MG TABS tablet Take 1 tablet (25 mg total) by mouth daily before breakfast. 08/21/22   Shamleffer, Melanie Crazier, MD  furosemide (LASIX) 20 MG tablet 1 tab PO daily PRN for swelling in legs 06/13/22   Ladell Pier, MD  glipiZIDE (GLUCOTROL) 5 MG tablet Take 1 tablet (5 mg total) by mouth 2 (two) times daily before a meal. 08/21/22   Shamleffer, Melanie Crazier, MD  Homeopathic Products [facility] RELIEF EX) Apply 1 application topically daily as needed (muscle cramps / pains).    [provider]  insulin glargine (LANTUS SOLOSTAR) 100 UNIT/ML Solostar Pen Inject 40 Units into the skin daily. 08/21/22   Shamleffer, Melanie Crazier, MD  Insulin Pen Needle (B-D UF III MINI PEN NEEDLES) 31G X 5 MM MISC Inject 1 Device into the skin daily in the afternoon. 5 mm 31G 3/15.  Use as directed 08/21/22   Shamleffer, Melanie Crazier, MD  metFORMIN (GLUCOPHAGE) 1000 MG tablet Take 1 tablet (1,000 mg total) by mouth 2 (two) times daily with a meal. 08/21/22   Shamleffer, Melanie Crazier, MD  metoprolol succinate (TOPROL XL) 25 MG 24 hr tablet Take 0.5 tablets (12.5 mg total) by mouth daily. 12/12/21   Argentina Donovan, PA-C  nitroGLYCERIN (NITROSTAT) 0.4 MG SL tablet Place 1 tablet (0.4 mg total) under the tongue every 5 (five) minutes as needed. 08/26/19   Cheryln Manly, NP  omeprazole (PRILOSEC) 20 MG capsule Take  1 capsule (20 mg total) by mouth daily as needed. 04/26/21   Ladell Pier, MD  Propylene Glycol (SYSTANE BALANCE) 0.6 % SOLN Apply 1-2 drops to eye every hour while awake. 05/09/22   Wynona Dove A, DO  rosuvastatin (CRESTOR) 10 MG tablet TAKE 1 TABLET(10 MG) BY MOUTH DAILY. 08/22/22   Ladell Pier, MD  sildenafil (VIAGRA) 50 MG tablet Take 50 mg by mouth daily as needed for erectile dysfunction.    [provider]  triamcinolone cream (KENALOG) 0.1 % Apply 1 Application topically 2 (two) times daily. 06/13/22   Ladell Pier, MD   Turmeric 500 MG CAPS Take 1,000 mg by mouth every evening.    [provider]      Allergies    Codeine, Crestor [rosuvastatin], Hydrocodone, and Oxycodone hcl    Review of Systems   Review of Systems  Constitutional:  Positive for diaphoresis.  Respiratory:  Positive for chest tightness.   Neurological:  Positive for light-headedness.  All other systems reviewed and are negative.   Physical Exam Updated Vital Signs BP 110/80 (BP Location: Left Arm)   Pulse 65   Temp 98.4 F (36.9 C) (Oral)   Resp 14   Ht 5\' 10"  (1.778 m)   Wt 95.3 kg   SpO2 100%   BMI 30.13 kg/m  Physical Exam Vitals and nursing note reviewed.  Constitutional:      General: He is not in acute distress.    Appearance: He is well-developed.  HENT:     Head: Normocephalic and atraumatic.  Eyes:     Conjunctiva/sclera: Conjunctivae normal.  Cardiovascular:     Rate and Rhythm: Normal rate and regular rhythm.     Pulses: Normal pulses.  Pulmonary:     Effort: Pulmonary effort is normal. No respiratory distress.     Breath sounds: Normal breath sounds.  Abdominal:     Palpations: Abdomen is soft.     Tenderness: There is no abdominal tenderness.  Musculoskeletal:        General: No swelling.     Cervical back: Neck supple.     Right lower leg: No edema.     Left lower leg: No edema.  Skin:    General: Skin is warm and dry.     Capillary Refill: Capillary refill takes less than 2 seconds.  Neurological:     General: No focal deficit present.     Mental Status: He is alert and oriented to person, place, and time. Mental status is at baseline.     Cranial Nerves: No cranial nerve deficit.     Sensory: No sensory deficit.     Motor: No weakness.  Psychiatric:        Mood and Affect: Mood normal.     ED Results / Procedures / Treatments   Labs (all labs ordered are listed, but only abnormal results are displayed) Labs Reviewed  COMPREHENSIVE METABOLIC PANEL - Abnormal; Notable for  the following components:      Result Value   Glucose, Bld 201 (*)    All other components within normal limits  CBC WITH DIFFERENTIAL/PLATELET  D-DIMER, QUANTITATIVE  TROPONIN I (HIGH SENSITIVITY)  TROPONIN I (HIGH SENSITIVITY)    EKG EKG Interpretation  Date/Time:  Monday September 29 2022 05:38:02 EDT Ventricular Rate:  63 PR Interval:  186 QRS Duration: 84 QT Interval:  426 QTC Calculation: 435 R Axis:   43 Text Interpretation: Normal sinus rhythm Normal ECG When compared with ECG  of 26-Aug-2019 09:19, PREVIOUS ECG IS PRESENT Confirmed by Gerlene Fee [phone]) on 09/29/2022 6:37:34 AM  Radiology DG Chest Portable 1 View  Result Date: 09/29/2022 CLINICAL DATA:  near syncope EXAM: PORTABLE CHEST - 1 VIEW COMPARISON:  07/04/2019 FINDINGS: Cardiac silhouette is unremarkable. No pneumothorax or pleural effusion. The lungs are clear. The visualized skeletal structures are unremarkable. IMPRESSION: No acute cardiopulmonary process. Electronically Signed   By: Sammie Bench M.D.   On: 09/29/2022 07:45    Procedures Procedures    Medications Ordered in ED Medications - No data to display  ED Course/ Medical Decision Making/ A&P                             Medical Decision Making Amount and/or Complexity of Data Reviewed Labs: ordered. Radiology: ordered.   52 year old male with medical history significant for DM 2, HLD, OSA, CAD status post stent placement during left heart catheterization in 2021, follows outpatient with Dr. Radford Pax of cardiology who presents to the emergency department after a near syncopal episode.  The patient states that he woke up this morning and had an episode of lightheadedness, profound diaphoresis and lasting several minutes.  He had a recurrence of symptoms without a prodrome while sitting at the kitchen table without a change in his posture.  He denied any chest pain but appeared to have mild shortness of breath.  He was near syncopal during both  episodes but did not fully pass out.  Since then, he has been chest pain free, had no further episodes.  No numbness, weakness, facial droop, no seizure-like activity.  He checked his blood sugar at the time and it was normal.  While he denied chest pain, he thought maybe his chest was tight during the episode.  He states that he had a similar presentation prior to him ultimately undergoing coronary CT and a blockage discovered in his heart requiring stent placement and he is concerned that he is having worsening coronary artery disease.  His diabetes and CAD is medically managed.    Paul [name] is a 52 y.o. male who presents with presyncope as per above.  Currently, Paul [name] is awake, alert, and hemodynamically stable.  I am most concerned for cardiac arrhythmia vs vasovagal near syncope. I do not think that this is due to ACS, PE, electrolyte abnormality, heart valve abnormality such as aortic stenosis, HOCM, pneumothorax, aortic dissection, ruptured AAA, anemia, hypoglycemia, seizure, stroke, hypovolemia.  On exam, he has intact distal pulses in all extremities, normal capillary refill, a regular heart rate, and normal breath sounds. There is no carotid bruit, unusual abdominal mass, or heart murmur.  As per above, there are no focal neurologic deficits on exam and the patient can ambulate without difficultly.  As per above, the ECG reveals no evidence of acute ischemia, dysrhythmia, or syncope-associated finding. The CXR was both interpreted by radiology and by myself. There is no evidence of cardiomegaly, pneumonia, pneumothorax, mediastinal widening, acute volume overload, fracture, or other acute, emergent intrathoracic pathology.    Labs reveal no acute abnormalities. Specifically, his initial troponin and d-dimer are not elevated.  I discussed the care of the patient with on-call cardiology, Dr. Audie Box who recommended a 2nd troponin and if reassuring, outpatient follow-up.  I  believe that the patient is stable for discharge based on his reassuring evaluation. At the time of discharge, his vitals were appropriate and he was able to ambulate without  difficultly. I provided ED return precautions. The patient agreed to followup outpatient as needed.   Final Clinical Impression(s) / ED Diagnoses Final diagnoses:  Near syncope    Rx / DC Orders ED Discharge Orders          Ordered    Ambulatory referral to Cardiology       Comments: If you have not heard from the Cardiology office within the next 72 hours please call [phone].   09/29/22 1123              Regan Lemming, MD 09/29/22 1514

## 2022-09-29 NOTE — ED Triage Notes (Signed)
Patient reports near syncopal episode , lightheadedness with diaphoresis /clammy this morning . History of CAD/Stents.

## 2022-10-15 NOTE — Progress Notes (Unsigned)
Office Visit    Patient Name: Paul [name] Date of Encounter: 10/16/2022  Primary Care Provider:  Marcine Matar, MD Primary Cardiologist:  Armanda Magic, MD Primary Electrophysiologist: None  Chief Complaint    Paul [name] is a 52 y.o. male with PMH of CAD s/p LHC in 2021 with DES/PCI to mid LAD, HLD, DM, OSA (on CPAP) who presents today posthospital follow-up of syncope and lightheadedness.  Past Medical History    Past Medical History:  Diagnosis Date   Diabetes mellitus without complication    Fatty liver    abnormal CT 2014   Heart disease    Hyperlipidemia    Kidney stones    Sleep apnea    wears CPAP   Past Surgical History:  Procedure Laterality Date   COLONOSCOPY     CORONARY STENT INTERVENTION N/A 08/26/2019   Procedure: CORONARY STENT INTERVENTION;  Surgeon: Iran Ouch, MD;  Location: [facility];  Service: Cardiovascular;  Laterality: N/A;   KNEE ARTHROSCOPY Right    LEFT HEART CATH AND CORONARY ANGIOGRAPHY N/A 08/26/2019   Procedure: LEFT HEART CATH AND CORONARY ANGIOGRAPHY;  Surgeon: Iran Ouch, MD;  Location: [facility];  Service: Cardiovascular;  Laterality: N/A;   UMBILICAL HERNIA REPAIR      Allergies  Allergies  Allergen Reactions   Codeine Nausea And Vomiting and Other (See Comments)    headache   Crestor [Rosuvastatin]     Elveated LFTs with Crestor 40mg  daily    Hydrocodone Nausea And Vomiting   Oxycodone Hcl Other (See Comments)    Headaches    History of Present Illness    Paul [name]  is a 52 year old male with the above mention past medical history who presents today for post hospital follow-up of lightheadedness and presyncope.  Paul [name] was initially seen in 2015 to establish care with Dr. Clifton James due to strong family history of CAD.  He underwent a exercise treadmill stress test that was normal with good exercise tolerance and no ischemia.  2D echo was completed 04/2013 with  normal LV function and no valve disease.  He was seen in 07/2019 by Dr. Mayford Knife for complaint of chest pain and syncope.  He presented to the ED with normal QTc and labs.  He was seen in follow-up by Dr. Mayford Knife and  underwent a coronary CTA that revealed calcium score of 8 with moderate atherosclerosis of the mid to distal LAD and FFR showing flow-limiting lesion in the LAD and D1.  He underwent outpatient left heart cath that revealed significant disease in the mid LAD that was treated with PCI/DES x 1 to the LAD.  He was placed on DAPT for 6 months.  He was seen last by Dr. Mayford Knife on 12/29/2021 and endorsed some chest discomfort that did not radiate.  He was noted to have angina as his anginal equivalent.  He underwent a PET stress test on 04/08/2022 that was normal with no evidence of ischemia or infarct. He was seen in the ED 09/29/2022 when he presented with a near syncopal episode.  He reports waking up with profound diaphoresis that lasted several minutes. Troponin's and D-dimer were both normal.  EKG revealed no evidence of ischemia or dysrhythmia.  Paul [name] presents today for posthospital follow-up.  Since last being seen in the office patient reports that he has been doing well with no recurrence of presyncope since his ED visit.  His blood pressure today  is well-controlled at 112/78 and heart rate is 66 bpm.  On examination he is euvolemic with exception of some trace lower extremity edema bilaterally.  He reports that he works 9 hours/week 6 days a week and as mostly sitting on his job.  He is compliant with his current medications and denies any adverse reactions.  During today's visit we discussed pathophysiology of vagal syncope and also discussed maintaining good glycemic control patient denies chest pain, palpitations, dyspnea, PND, orthopnea, nausea, vomiting, dizziness, syncope, edema, weight gain, or early satiety.  Home Medications    Current Outpatient Medications  Medication Sig Dispense  Refill   aspirin EC 81 MG tablet Take 1 tablet (81 mg total) by mouth daily. 90 tablet 3   clopidogrel (PLAVIX) 75 MG tablet Take 1 tablet (75 mg total) by mouth daily. 90 tablet 2   Dulaglutide (TRULICITY) 1.5 MG/0.5ML SOPN Inject 1.5 mg into the skin once a week. 6 mL 3   empagliflozin (JARDIANCE) 25 MG TABS tablet Take 1 tablet (25 mg total) by mouth daily before breakfast. 90 tablet 3   furosemide (LASIX) 20 MG tablet 1 tab PO daily PRN for swelling in legs 30 tablet 1   glipiZIDE (GLUCOTROL) 5 MG tablet Take 1 tablet (5 mg total) by mouth 2 (two) times daily before a meal. 180 tablet 3   Homeopathic Products (THERAWORX RELIEF EX) Apply 1 application topically daily as needed (muscle cramps / pains).     insulin glargine (LANTUS SOLOSTAR) 100 UNIT/ML Solostar Pen Inject 40 Units into the skin daily. 45 mL 3   Insulin Pen Needle (B-D UF III MINI PEN NEEDLES) 31G X 5 MM MISC Inject 1 Device into the skin daily in the afternoon. 5 mm 31G 3/15.  Use as directed 100 each 3   metFORMIN (GLUCOPHAGE) 1000 MG tablet Take 1 tablet (1,000 mg total) by mouth 2 (two) times daily with a meal. 180 tablet 3   metoprolol succinate (TOPROL XL) 25 MG 24 hr tablet Take 0.5 tablets (12.5 mg total) by mouth daily. 90 tablet 1   nitroGLYCERIN (NITROSTAT) 0.4 MG SL tablet Place 1 tablet (0.4 mg total) under the tongue every 5 (five) minutes as needed. 25 tablet 2   omeprazole (PRILOSEC) 20 MG capsule Take 1 capsule (20 mg total) by mouth daily as needed. 30 capsule 3   rosuvastatin (CRESTOR) 10 MG tablet TAKE 1 TABLET(10 MG) BY MOUTH DAILY. 90 tablet 1   sildenafil (VIAGRA) 50 MG tablet Take 50 mg by mouth daily as needed for erectile dysfunction.     triamcinolone cream (KENALOG) 0.1 % Apply 1 Application topically 2 (two) times daily. 30 g 0   Turmeric 500 MG CAPS Take 1,000 mg by mouth once a week.     No current facility-administered medications for this visit.     Review of Systems  Please see the history of  present illness.    (+) Trace lower extremity edema  All other systems reviewed and are otherwise negative except as noted above.  Physical Exam    Wt Readings from Last 3 Encounters:  10/16/22 215 lb (97.5 kg)  09/29/22 210 lb (95.3 kg)  08/21/22 216 lb 12.8 oz (98.3 kg)   VS: Vitals:   10/16/22 1040  BP: 112/78  Pulse: 66  SpO2: 96%  ,Body mass index is 30.85 kg/m.  Constitutional:      Appearance: Healthy appearance. Not in distress.  Neck:     Vascular: JVD normal.  Pulmonary:  Effort: Pulmonary effort is normal.     Breath sounds: No wheezing. No rales. Diminished in the bases Cardiovascular:     Normal rate. Regular rhythm. Normal S1. Normal S2.      Murmurs: There is no murmur.  Edema:    Peripheral edema absent.  Abdominal:     Palpations: Abdomen is soft non tender. There is no hepatomegaly.  Skin:    General: Skin is warm and dry.  Neurological:     General: No focal deficit present.     Mental Status: Alert and oriented to person, place and time.     Cranial Nerves: Cranial nerves are intact.  EKG/LABS/ Recent Cardiac Studies    ECG personally reviewed by me today -none completed today  Lab Results  Component Value Date   WBC 7.4 09/29/2022   HGB 16.1 09/29/2022   HCT 45.4 09/29/2022   MCV 85.8 09/29/2022   PLT 220 09/29/2022   Lab Results  Component Value Date   CREATININE 0.95 09/29/2022   BUN 18 09/29/2022   NA 140 09/29/2022   K 4.0 09/29/2022   CL 104 09/29/2022   CO2 24 09/29/2022   Lab Results  Component Value Date   ALT 32 09/29/2022   AST 25 09/29/2022   ALKPHOS 74 09/29/2022   BILITOT 0.8 09/29/2022   Lab Results  Component Value Date   CHOL 121 12/02/2021   HDL 40.90 12/02/2021   LDLCALC 58 12/02/2021   TRIG 109.0 12/02/2021   CHOLHDL 3 12/02/2021    Lab Results  Component Value Date   HGBA1C 8.7 (A) 08/21/2022    Cardiac Studies & Procedures   CARDIAC CATHETERIZATION  CARDIAC CATHETERIZATION  08/26/2019  Narrative  The left ventricular systolic function is normal.  LV end diastolic pressure is normal.  The left ventricular ejection fraction is 55-65% by visual estimate.  1st Diag lesion is 60% stenosed.  Mid LAD-1 lesion is 40% stenosed.  Mid LAD-2 lesion is 80% stenosed.  Post intervention, there is a 0% residual stenosis.  A drug-eluting stent was successfully placed using a STENT RESOLUTE ONYX 2.5X22.  1.  Significant one-vessel coronary artery disease involving the mid to distal LAD.  The left circumflex is very small.  However, the RCA is very large and super dominant.  There is also 60% stenosis in a large first diagonal and moderate mid LAD stenosis 2.  Normal LV systolic function and left ventricular end-diastolic pressure. 3.  Successful angioplasty and drug-eluting stent placement to the mid/distal LAD.  Recommendations: Dual antiplatelet therapy for at least 6 months. The patient needs aggressive treatment of his risk factors especially with the presence of residual coronary artery disease.  He needs to be in a potent statin and I elected to switch him to rosuvastatin from pravastatin. The patient is a candidate for same-day discharge.  Findings Coronary Findings Diagnostic  Dominance: Right  Left Main Vessel is angiographically normal.  Left Anterior Descending Mid LAD-1 lesion is 40% stenosed. Mid LAD-2 lesion is 80% stenosed. The lesion is type C. The lesion was not previously treated.  First Diagonal Branch 1st Diag lesion is 60% stenosed.  Left Circumflex Vessel is small.  Right Coronary Artery Vessel is large. Vessel is angiographically normal.  Right Posterior Atrioventricular Artery Vessel is large in size. Vessel is angiographically normal.  Third Right Posterolateral Branch Vessel is large in size. Vessel is angiographically normal.  Intervention  Mid LAD-2 lesion Stent Lesion length:  21 mm. CATHETER LAUNCHER 6FR EBU3.5  guide  catheter was inserted. Lesion crossed with guidewire using a WIRE RUNTHROUGH .[id number]. Pre-stent angioplasty was performed using a BALLOON SAPPHIRE 2.0X15. Maximum pressure:  6 atm. Inflation time:  30 sec. A drug-eluting stent was successfully placed using a STENT RESOLUTE ONYX 2.5X22. Maximum pressure: 12 atm. Inflation time: 20 sec. Post-stent angioplasty was performed using a BALLOON SAPPHIRE [place] [id number]. Maximum pressure:  14 atm. Inflation time:  20 sec. Post-Intervention Lesion Assessment The intervention was successful. Pre-interventional TIMI flow is 3. Post-intervention TIMI flow is 3. No complications occurred at this lesion. There is a 0% residual stenosis post intervention.   STRESS TESTS  NM PET CT CARDIAC PERFUSION MULTI W/ABSOLUTE BLOODFLOW 04/08/2022  Narrative   The study is normal. The study is low risk.   LV perfusion is normal. There is no evidence of ischemia. There is no evidence of infarction.   Rest left ventricular function is normal. Rest EF: 55 %. Stress left ventricular function is normal. Stress EF: 57 %. End diastolic cavity size is normal. End systolic cavity size is normal.   Myocardial blood flow was computed to be 0.37ml/g/min at rest and 2.48ml/g/min at stress. Global myocardial blood flow reserve was 4.12 and was normal.   Coronary calcium was present on the attenuation correction CT images. Moderate coronary calcifications were present. Coronary calcifications were present in the left anterior descending artery distribution(s).  CLINICAL DATA:  This over-read does not include interpretation of cardiac or coronary anatomy or pathology. The cardiac PET-CT interpretation by the cardiologist is attached.  COMPARISON:  Cardiac CT 08/19/2019  FINDINGS: Vascular: The thoracic aorta is normal in caliber. Coronary artery calcifications are noted.  Mediastinum/Nodes: No mediastinal or hilar mass or lymphadenopathy. The esophagus is grossly.  Lungs/Pleura: No  worrisome pulmonary lesions or pulmonary nodules. No pleural effusion.  Upper Abdomen: No significant upper abdominal findings.  Musculoskeletal: No significant bony findings.  IMPRESSION: No significant extracardiac findings.   Electronically Signed By: Rudie Meyer M.D. On: 04/08/2022 10:30   ECHOCARDIOGRAM  ECHOCARDIOGRAM COMPLETE 07/22/2019  Narrative ECHOCARDIOGRAM REPORT    Patient Name:   DHILAN BRAUER Date of Exam: 07/22/2019 Medical Rec #:  [id number]         Height:       70.0 in Accession #:    [id number]        Weight:       208.0 lb Date of Birth:  1970-10-11        BSA:          2.12 m Patient Age:    48 years          BP:           111/67 mmHg Patient Gender: M                 HR:           69 bpm. Exam Location:  [address]  Procedure: 2D Echo, 3D Echo, Cardiac Doppler, Color Doppler and Strain Analysis  Indications:    R07.9 Chest pain  History:        Patient has prior history of Echocardiogram examinations, most recent 05/10/2014. Risk Factors:Sleep Apnea, Diabetes and Dyslipidemia.  Sonographer:    Garald Braver, RDCS Referring Phys: [phone] TRACI R TURNER  IMPRESSIONS   1. Left ventricular ejection fraction, by visual estimation, is 55 to 60%. The left ventricle has normal function. There is no left ventricular hypertrophy. 2. The left ventricle has no regional wall motion abnormalities.  3. Global right ventricle has normal systolic function.The right ventricular size is normal. 4. Left atrial size was normal. 5. Right atrial size was normal. 6. The mitral valve is normal in structure. Trivial mitral valve regurgitation. No evidence of mitral stenosis. 7. The tricuspid valve is normal in structure. Tricuspid valve regurgitation is trivial. 8. The aortic valve is tricuspid. Aortic valve regurgitation is not visualized. No evidence of aortic valve sclerosis or stenosis. 9. The pulmonic valve was normal in structure. Pulmonic valve  regurgitation is trivial. 10. Aortic dilatation noted. 11. There is mild dilatation of the aortic root measuring 38 mm. 12. The inferior vena cava is normal in size with greater than 50% respiratory variability, suggesting right atrial pressure of 3 mmHg. 13. Normal LV function; no significant valvular abnormality.  In comparison to the previous echocardiogram(s): 05/10/14 EF 55-60%. FINDINGS Left Ventricle: Left ventricular ejection fraction, by visual estimation, is 55 to 60%. The left ventricle has normal function. The left ventricle has no regional wall motion abnormalities. There is no left ventricular hypertrophy. Left ventricular diastolic parameters were normal. Normal left atrial pressure.  Right Ventricle: The right ventricular size is normal.Global RV systolic function is has normal systolic function.  Left Atrium: Left atrial size was normal in size.  Right Atrium: Right atrial size was normal in size  Pericardium: There is no evidence of pericardial effusion.  Mitral Valve: The mitral valve is normal in structure. Trivial mitral valve regurgitation. No evidence of mitral valve stenosis by observation.  Tricuspid Valve: The tricuspid valve is normal in structure. Tricuspid valve regurgitation is trivial.  Aortic Valve: The aortic valve is tricuspid. Aortic valve regurgitation is not visualized. The aortic valve is structurally normal, with no evidence of sclerosis or stenosis.  Pulmonic Valve: The pulmonic valve was normal in structure. Pulmonic valve regurgitation is trivial. Pulmonic regurgitation is trivial.  Aorta: Aortic dilatation noted. There is mild dilatation of the aortic root measuring 38 mm.  Venous: The inferior vena cava is normal in size with greater than 50% respiratory variability, suggesting right atrial pressure of 3 mmHg.  IAS/Shunts: No atrial level shunt detected by color flow Doppler.  Additional Comments: Normal LV function; no significant valvular  abnormality.  LEFT VENTRICLE PLAX 2D LVIDd:         4.50 cm  Diastology LVIDs:         3.20 cm  LV e' lateral:   12.00 cm/s LV PW:         1.10 cm  LV E/e' lateral: 6.1 LV IVS:        1.10 cm  LV e' medial:    8.31 cm/s LVOT diam:     2.30 cm  LV E/e' medial:  8.7 LV SV:         51 ml LV SV Index:   23.60    2D Longitudinal Strain LVOT Area:     4.15 cm 2D Strain GLS (A2C):   -21.9 % 2D Strain GLS (A3C):   -18.6 % 2D Strain GLS (A4C):   -24.1 % 2D Strain GLS Avg:     -21.5 %  3D Volume EF: 3D EF:        56 % LV EDV:       139 ml LV ESV:       61 ml LV SV:        78 ml  RIGHT VENTRICLE RV Basal diam:  3.60 cm RV S prime:     13.60 cm/s TAPSE (M-mode):  2.4 cm  LEFT ATRIUM             Index       RIGHT ATRIUM           Index LA diam:        3.70 cm 1.74 cm/m  RA Pressure: 3.00 mmHg LA Vol (A2C):   40.9 ml 19.27 ml/m RA Area:     13.60 cm LA Vol (A4C):   29.4 ml 13.85 ml/m RA Volume:   31.30 ml  14.75 ml/m LA Biplane Vol: 35.9 ml 16.91 ml/m AORTIC VALVE LVOT Vmax:   106.00 cm/s LVOT Vmean:  70.800 cm/s LVOT VTI:    0.215 m  AORTA Ao Root diam: 3.80 cm Ao Asc diam:  3.60 cm  MITRAL VALVE                        TRICUSPID VALVE Estimated RAP:  3.00 mmHg  MV Decel Time: 190 msec             SHUNTS MV E velocity: 72.70 cm/s 103 cm/s  Systemic VTI:  0.22 m MV A velocity: 59.60 cm/s 70.3 cm/s Systemic Diam: 2.30 cm MV E/A ratio:  1.22       1.5   Olga Millers MD Electronically signed by Olga Millers MD Signature Date/Time: 07/22/2019/1:35:30 PM    Final    MONITORS  CARDIAC EVENT MONITOR 08/29/2019  Narrative  Sinus bradycardia, normal sinus rhythm and sinus tachycardia. The average heart rate was78bpm, and ranged from 52 to 165bpm  Occasional PVCs.  Nonsustained ventricular tachycardia up to 8 beats.   CT SCANS  CT CORONARY MORPH W/CTA COR W/SCORE 08/19/2019  Addendum 08/19/2019  8:15 PM ADDENDUM REPORT: 08/19/2019  20:13  EXAM: Cardiac/Coronary  CT  TECHNIQUE: The patient was scanned on a [employer].  FINDINGS: A 120 kV prospective scan was triggered in the descending thoracic aorta at 111 HU's. Axial non-contrast 3 mm slices were carried out through the heart. The data set was analyzed on a dedicated work station and scored using the Agatson method. Gantry rotation speed was 250 msecs and collimation was .6 mm. No beta blockade and 0.8 mg of sl NTG was given. The 3D data set was reconstructed in 5% intervals of the 67-82 % of the R-R cycle. Diastolic phases were analyzed on a dedicated work station using MPR, MIP and VRT modes. The patient received 80 cc of contrast.  Aorta:  Normal size.  No calcifications.  No dissection.  Aortic Valve:  Trileaflet.  No calcifications.  Coronary Arteries:  Normal coronary origin.  Right dominance.  RCA is a large dominant artery that gives rise to PDA and PLVB. There is no plaque.  Left main is a large artery that gives rise to LAD and LCX arteries. There is no plaque.  LAD is a large vessel that gives rise to a large D1. There is moderate non calcified plaque in the proximal to mid D1 with associated stenosis of 50-69%. There is mild calcified plaque in the mid LAD with associated stenosis of 25-49%. There is moderate non calcified plaque in the mid to distal LAD with associated stenosis of 50-69%.  LCX is a non-dominant artery small artery that is poorly visualized but no obvious plaque present.  Other findings:  Normal pulmonary vein drainage into the left atrium.  Normal let atrial appendage without a thrombus.  Normal size of the pulmonary artery.  IMPRESSION: 1. Coronary calcium score  of 8. This was 67th percentile for age and sex matched control.  2.  Normal coronary origin with right dominance.  3. Moderate atherosclerosis of the mid to distal LAD and D1. CAD-RADS 3.  4. Consider symptom-guided anti-ischemic and  preventive pharmacotherapy as well as risk factor modification per guideline-directed care.  5.  The study has been submitted for FFR analysis.  Armanda Magic   Electronically Signed By: Armanda Magic On: 08/19/2019 20:13  Narrative EXAM: OVER-READ INTERPRETATION  CT CHEST  The following report is an over-read performed by radiologist Dr. Trudie Reed of [facility] on 08/19/2019. This over-read does not include interpretation of cardiac or coronary anatomy or pathology. The coronary calcium score/coronary CTA interpretation by the cardiologist is attached.  COMPARISON:  None.  FINDINGS: Within the visualized portions of the thorax there are no suspicious appearing pulmonary nodules or masses, there is no acute consolidative airspace disease, no pleural effusions, no pneumothorax and no lymphadenopathy. Visualized portions of the upper abdomen are unremarkable. There are no aggressive appearing lytic or blastic lesions noted in the visualized portions of the skeleton.  IMPRESSION: No significant incidental noncardiac findings are noted.  Electronically Signed: By: Trudie Reed M.D. On: 08/19/2019 16:36          Assessment & Plan    1.  Presyncope: -Patient was seen 09/29/2022 with complaint of presyncope and diaphoresis similar to presentation prior to having LHC performed with stent placed. -Today patient reports no recurrence of syncope or dizziness since his hospital follow-up. -We will have patient wear a 14-day ZIO monitor to rule out any malignant arrhythmias or sinus pauses. -We will obtain carotid Dopplers to rule out possible stenosis. -Repeat 2D echo to rule out any valvular or structural heart changes.  2.  Coronary artery disease: -LHC completed with moderate atherosclerosis in the mid to distal LAD treated with DES/PCI x 1 -Today patient reports no chest discomfort or anginal equivalent.   -Continue GDMT withPlavix 75 mg, ASA 81 mg,  Toprol-XL 12.5 mg Nitrostat 0.4 mg daily, Crestor daily  3.  DM type II: -Patient's last hemoglobin A1c was above goal at 8.7 -Continue Jardiance, and glycemic control medications per PCP and endocrinology.  4.  Hyperlipidemia: -Patient's last LDL cholesterol was controlled at 58 -Continue 10 mg daily -Repeat lipids and LFTs    5. Sleep Apnea: -Patient reports compliance with CPAP machine  Disposition: Follow-up with Armanda Magic, MD or APP in 2 months   Medication Adjustments/Labs and Tests Ordered: Current medicines are reviewed at length with the patient today.  Concerns regarding medicines are outlined above.   Signed, Paul [name], Paul Rains, NP 10/16/2022, 11:47 AM [facility]  Note:  This document was prepared using Dragon voice recognition software and may include unintentional dictation errors.

## 2022-10-16 ENCOUNTER — Ambulatory Visit: Attending: Nurse Practitioner | Admitting: Nurse Practitioner

## 2022-10-16 ENCOUNTER — Ambulatory Visit (INDEPENDENT_AMBULATORY_CARE_PROVIDER_SITE_OTHER)

## 2022-10-16 ENCOUNTER — Encounter: Payer: Self-pay | Admitting: Nurse Practitioner

## 2022-10-16 VITALS — BP 112/78 | HR 66 | Ht 70.0 in | Wt 215.0 lb

## 2022-10-16 DIAGNOSIS — I2510 Atherosclerotic heart disease of native coronary artery without angina pectoris: Secondary | ICD-10-CM

## 2022-10-16 DIAGNOSIS — Z794 Long term (current) use of insulin: Secondary | ICD-10-CM

## 2022-10-16 DIAGNOSIS — R55 Syncope and collapse: Secondary | ICD-10-CM

## 2022-10-16 DIAGNOSIS — E1169 Type 2 diabetes mellitus with other specified complication: Secondary | ICD-10-CM | POA: Diagnosis not present

## 2022-10-16 DIAGNOSIS — E1165 Type 2 diabetes mellitus with hyperglycemia: Secondary | ICD-10-CM | POA: Diagnosis not present

## 2022-10-16 DIAGNOSIS — G4733 Obstructive sleep apnea (adult) (pediatric): Secondary | ICD-10-CM

## 2022-10-16 DIAGNOSIS — E785 Hyperlipidemia, unspecified: Secondary | ICD-10-CM

## 2022-10-16 NOTE — Progress Notes (Unsigned)
Enrolled for [employer] to mail a ZIO XT long term holter monitor to the patients address on file.   Dr. Turner to read. 

## 2022-10-16 NOTE — Patient Instructions (Addendum)
Medication Instructions:  Your physician recommends that you continue on your current medications as directed. Please refer to the Current Medication list given to you today. *If you need a refill on your cardiac medications before your next appointment, please call your pharmacy*   Lab Work: 1 week LFT & LIPIDS If you have labs (blood work) drawn today and your tests are completely normal, you will receive your results only by: MyChart Message (if you have MyChart) OR A paper copy in the mail If you have any lab test that is abnormal or we need to change your treatment, we will call you to review the results.   Testing/Procedures: Your physician has requested that you have an echocardiogram. Echocardiography is a painless test that uses sound waves to create images of your heart. It provides your doctor with information about the size and shape of your heart and how well your heart's chambers and valves are working. This procedure takes approximately one hour. There are no restrictions for this procedure. Please do NOT wear cologne, perfume, aftershave, or lotions (deodorant is allowed). Please arrive 15 minutes prior to your appointment time.  Your physician has requested that you have a carotid duplex. This test is an ultrasound of the carotid arteries in your neck. It looks at blood flow through these arteries that supply the brain with blood. Allow one hour for this exam. There are no restrictions or special instructions.  ZIO XT- Long Term Monitor Instructions  Your physician has requested you wear a ZIO patch monitor for 14 days.  This is a single patch monitor. [employer] supplies one patch monitor per enrollment. Additional stickers are not available. Please do not apply patch if you will be having a Nuclear Stress Test,  Echocardiogram, Cardiac CT, MRI, or Chest Xray during the period you would be wearing the  monitor. The patch cannot be worn during these tests. You cannot remove  and re-apply the  ZIO XT patch monitor.  Your ZIO patch monitor will be mailed 3 day USPS to your address on file. It may take 3-5 days  to receive your monitor after you have been enrolled.  Once you have received your monitor, please review the enclosed instructions. Your monitor  has already been registered assigning a specific monitor serial # to you.  Billing and Patient Assistance Program Information  We have supplied [employer] with any of your insurance information on file for billing purposes. [employer] offers a sliding scale Patient Assistance Program for patients that do not have  insurance, or whose insurance does not completely cover the cost of the ZIO monitor.  You must apply for the Patient Assistance Program to qualify for this discounted rate.  To apply, please call [employer] at [phone], select option 4, select option 2, ask to apply for  Patient Assistance Program. Meredeth Ide will ask your household income, and how many people  are in your household. They will quote your out-of-pocket cost based on that information.  [employer] will also be able to set up a 56-month, interest-free payment plan if needed.  Applying the monitor   Shave hair from upper left chest.  Hold abrader disc by orange tab. Rub abrader in 40 strokes over the upper left chest as  indicated in your monitor instructions.  Clean area with 4 enclosed alcohol pads. Let dry.  Apply patch as indicated in monitor instructions. Patch will be placed under collarbone on left  side of chest with arrow pointing upward.  Rub patch adhesive wings  for 2 minutes. Remove white label marked "1". Remove the white  label marked "2". Rub patch adhesive wings for 2 additional minutes.  While looking in a mirror, press and release button in center of patch. A small green light will  flash 3-4 times. This will be your only indicator that the monitor has been turned on.  Do not shower for the first 24 hours. You may shower after  the first 24 hours.  Press the button if you feel a symptom. You will hear a small click. Record Date, Time and  Symptom in the Patient Logbook.  When you are ready to remove the patch, follow instructions on the last 2 pages of Patient  Logbook. Stick patch monitor onto the last page of Patient Logbook.  Place Patient Logbook in the blue and white box. Use locking tab on box and tape box closed  securely. The blue and white box has prepaid postage on it. Please place it in the mailbox as  soon as possible. Your physician should have your test results approximately 7 days after the  monitor has been mailed back to [facility].  Call [facility] Customer Care at [phone] if you have questions regarding  your ZIO XT patch monitor. Call them immediately if you see an orange light blinking on your  monitor.  If your monitor falls off in less than 4 days, contact our Monitor department at [phone].  If your monitor becomes loose or falls off after 4 days call [employer] at [phone] for  suggestions on securing your monitor   Follow-Up: At [facility], you and your health needs are our priority.  As part of our continuing mission to provide you with exceptional heart care, we have created designated Provider Care Teams.  These Care Teams include your primary Cardiologist (physician) and Advanced Practice Providers (APPs -  Physician Assistants and Nurse Practitioners) who all work together to provide you with the care you need, when you need it.  We recommend signing up for the patient portal called "MyChart".  Sign up information is provided on this After Visit Summary.  MyChart is used to connect with patients for Virtual Visits (Telemedicine).  Patients are able to view lab/test results, encounter notes, upcoming appointments, etc.  Non-urgent messages can be sent to your provider as well.   To learn more about what you can do with MyChart, go to [email or web address].     Your next appointment:   2 month(s)  Provider:   Armanda Magic, MD  or Robin Searing, NP   Other Instructions

## 2022-10-23 ENCOUNTER — Ambulatory Visit: Attending: Nurse Practitioner

## 2022-10-23 DIAGNOSIS — R55 Syncope and collapse: Secondary | ICD-10-CM

## 2022-10-23 DIAGNOSIS — E1165 Type 2 diabetes mellitus with hyperglycemia: Secondary | ICD-10-CM

## 2022-10-23 DIAGNOSIS — I2510 Atherosclerotic heart disease of native coronary artery without angina pectoris: Secondary | ICD-10-CM

## 2022-10-23 DIAGNOSIS — E1169 Type 2 diabetes mellitus with other specified complication: Secondary | ICD-10-CM

## 2022-10-24 LAB — LIPID PANEL
Chol/HDL Ratio: 3.2 ratio (ref 0.0–5.0)
Cholesterol, Total: 120 mg/dL (ref 100–199)
HDL: 38 mg/dL — ABNORMAL LOW (ref >39)
LDL Chol Calc (NIH): 61 mg/dL (ref 0–99)
Triglycerides: 116 mg/dL (ref 0–149)
VLDL Cholesterol Cal: 21 mg/dL (ref 5–40)

## 2022-10-24 LAB — HEPATIC FUNCTION PANEL
ALT: 26 IU/L (ref 0–44)
AST: 18 IU/L (ref 0–40)
Albumin: 4.4 g/dL (ref 3.8–4.9)
Alkaline Phosphatase: 86 IU/L (ref 44–121)
Bilirubin Total: 0.6 mg/dL (ref 0.0–1.2)
Bilirubin, Direct: 0.16 mg/dL (ref 0.00–0.40)
Total Protein: 6.2 g/dL (ref 6.0–8.5)

## 2022-10-28 DIAGNOSIS — E1165 Type 2 diabetes mellitus with hyperglycemia: Secondary | ICD-10-CM

## 2022-10-28 DIAGNOSIS — E1169 Type 2 diabetes mellitus with other specified complication: Secondary | ICD-10-CM | POA: Diagnosis not present

## 2022-10-28 DIAGNOSIS — I2510 Atherosclerotic heart disease of native coronary artery without angina pectoris: Secondary | ICD-10-CM | POA: Diagnosis not present

## 2022-10-28 DIAGNOSIS — R55 Syncope and collapse: Secondary | ICD-10-CM | POA: Diagnosis not present

## 2022-10-28 DIAGNOSIS — E785 Hyperlipidemia, unspecified: Secondary | ICD-10-CM

## 2022-10-28 DIAGNOSIS — Z794 Long term (current) use of insulin: Secondary | ICD-10-CM

## 2022-11-13 ENCOUNTER — Encounter: Payer: Self-pay | Admitting: Internal Medicine

## 2022-11-13 ENCOUNTER — Ambulatory Visit: Admitting: Internal Medicine

## 2022-11-13 VITALS — BP 126/78 | HR 63 | Ht 70.0 in | Wt 213.0 lb

## 2022-11-13 DIAGNOSIS — E1165 Type 2 diabetes mellitus with hyperglycemia: Secondary | ICD-10-CM

## 2022-11-13 DIAGNOSIS — E1159 Type 2 diabetes mellitus with other circulatory complications: Secondary | ICD-10-CM

## 2022-11-13 DIAGNOSIS — Z794 Long term (current) use of insulin: Secondary | ICD-10-CM | POA: Diagnosis not present

## 2022-11-13 LAB — POCT GLYCOSYLATED HEMOGLOBIN (HGB A1C): Hemoglobin A1C: 8.6 % — AB (ref 4.0–5.6)

## 2022-11-13 LAB — POCT GLUCOSE (DEVICE FOR HOME USE): POC Glucose: 213 mg/dl — AB (ref 70–99)

## 2022-11-13 MED ORDER — LANTUS SOLOSTAR 100 UNIT/ML ~~LOC~~ SOPN
40.0000 [IU] | PEN_INJECTOR | Freq: Every day | SUBCUTANEOUS | 3 refills | Status: DC
Start: 2022-11-13 — End: 2023-05-19

## 2022-11-13 MED ORDER — EMPAGLIFLOZIN 25 MG PO TABS: 25.0000 mg | ORAL_TABLET | Freq: Every day | ORAL | 3 refills | Status: DC

## 2022-11-13 MED ORDER — GLIPIZIDE 5 MG PO TABS: 5.0000 mg | ORAL_TABLET | Freq: Two times a day (BID) | ORAL | 3 refills | Status: DC

## 2022-11-13 MED ORDER — BD PEN NEEDLE MINI U/F 31G X 5 MM MISC: 1.0000 | Freq: Every day | 3 refills | Status: DC

## 2022-11-13 MED ORDER — SEMAGLUTIDE(0.25 OR 0.5MG/DOS) 2 MG/3ML ~~LOC~~ SOPN: 0.5000 mg | PEN_INJECTOR | SUBCUTANEOUS | 3 refills | Status: DC

## 2022-11-13 MED ORDER — METFORMIN HCL 1000 MG PO TABS: 1000.0000 mg | ORAL_TABLET | Freq: Two times a day (BID) | ORAL | 3 refills | Status: DC

## 2022-11-13 NOTE — Progress Notes (Signed)
Name: Paul [name]  MRN/ DOB: [id number], 26-Jul-1970   Age/ Sex: 52 y.o., male    PCP: Marcine Matar, MD   Reason for Endocrinology Evaluation: Type 2 Diabetes Mellitus     Date of Initial Endocrinology Visit: 12/02/2021    PATIENT IDENTIFIER: [name] is a 52 y.o. male with a past medical history of DM, CAD, HTN and dyslipidemia, OSA on CPAP . The patient presented for initial endocrinology clinic visit on 12/02/2021  for consultative assistance with his diabetes management.    HPI: Paul [name] was    Diagnosed with DM 2019 Hemoglobin A1c has ranged from 8.3% in 2022, peaking at 9.9% in 2019.  On his initial visit to our clinic his A1c was 7.9%, he was on metformin, Trulicity, Jardiance, and Lantus. We increased Trulicity and Jardiance, continued metformin and basal insulin   Started glipizide 07/2022  Switch Trulicity to Ozempic 10/2022 due to shortage of supplies  SUBJECTIVE:   During the last visit (08/21/2022): A1c 8.7%    Today (11/13/22): Paul [name] is here for a follow up on diabetes management. He checks his blood sugars occasionally . The patient has  had hypoglycemic episodes since the last clinic visit, he is symptomatic with these episodes , mainly in the morning    Insurance did not cover freestyle Josephine Igo  He has been without Trulicity for ~ 7 months   Denies diarrhea or diarrhea  Denies nausea or vomiting    HOME DIABETES REGIMEN: Metformin 1000 mg BID Glipizide 5 mg twice daily Jardiance 25 mg daily  Lantus 40 units daily  - takes 46 units    Statin: yes ACE-I/ARB: no    METER DOWNLOAD SUMMARY: did not bring     DIABETIC COMPLICATIONS: Microvascular complications:  Retinopathy (mild)  Denies: CKD, neuropathy  Last eye exam: Completed 10/2021  Macrovascular complications:  CAD (S/P PCI) Denies: PVD, CVA   PAST HISTORY: Past Medical History:  Past Medical History:  Diagnosis Date   Diabetes mellitus without  complication (HCC)    Fatty liver    abnormal CT 2014   Heart disease    Hyperlipidemia    Kidney stones    Sleep apnea    wears CPAP   Past Surgical History:  Past Surgical History:  Procedure Laterality Date   COLONOSCOPY     CORONARY STENT INTERVENTION N/A 08/26/2019   Procedure: CORONARY STENT INTERVENTION;  Surgeon: Iran Ouch, MD;  Location: [facility];  Service: Cardiovascular;  Laterality: N/A;   KNEE ARTHROSCOPY Right    LEFT HEART CATH AND CORONARY ANGIOGRAPHY N/A 08/26/2019   Procedure: LEFT HEART CATH AND CORONARY ANGIOGRAPHY;  Surgeon: Iran Ouch, MD;  Location: [facility];  Service: Cardiovascular;  Laterality: N/A;   UMBILICAL HERNIA REPAIR      Social History:  reports that he has never smoked. He has never used smokeless tobacco. He reports current alcohol use. He reports that he does not use drugs. Family History:  Family History  Problem Relation Age of Onset   Hypertension Father    Diabetes Mother    Sleep apnea Other    Heart attack Maternal Grandfather    Heart attack Paternal Grandfather    Cancer Neg Hx    Colon cancer Neg Hx    Esophageal cancer Neg Hx    Stomach cancer Neg Hx    Rectal cancer Neg Hx      HOME MEDICATIONS: Allergies as of 11/13/2022  Reactions   Codeine Nausea And Vomiting, Other (See Comments)   headache   Crestor [rosuvastatin]    Elveated LFTs with Crestor 40mg  daily   Hydrocodone Nausea And Vomiting   Oxycodone Hcl Other (See Comments)   Headaches        Medication List        Accurate as of Nov 13, 2022  7:36 AM. If you have any questions, ask your nurse or doctor.          aspirin EC 81 MG tablet Take 1 tablet (81 mg total) by mouth daily.   B-D UF III MINI PEN NEEDLES 31G X 5 MM Misc Generic drug: Insulin Pen Needle Inject 1 Device into the skin daily in the afternoon. 5 mm 31G 3/15.  Use as directed   clopidogrel 75 MG tablet Commonly known as: PLAVIX Take 1 tablet  (75 mg total) by mouth daily.   empagliflozin 25 MG Tabs tablet Commonly known as: JARDIANCE Take 1 tablet (25 mg total) by mouth daily before breakfast.   furosemide 20 MG tablet Commonly known as: LASIX 1 tab PO daily PRN for swelling in legs   glipiZIDE 5 MG tablet Commonly known as: GLUCOTROL Take 1 tablet (5 mg total) by mouth 2 (two) times daily before a meal.   Lantus SoloStar 100 UNIT/ML Solostar Pen Generic drug: insulin glargine Inject 40 Units into the skin daily. What changed: how much to take   metFORMIN 1000 MG tablet Commonly known as: GLUCOPHAGE Take 1 tablet (1,000 mg total) by mouth 2 (two) times daily with a meal.   metoprolol succinate 25 MG 24 hr tablet Commonly known as: Toprol XL Take 0.5 tablets (12.5 mg total) by mouth daily.   nitroGLYCERIN 0.4 MG SL tablet Commonly known as: Nitrostat Place 1 tablet (0.4 mg total) under the tongue every 5 (five) minutes as needed.   omeprazole 20 MG capsule Commonly known as: PRILOSEC Take 1 capsule (20 mg total) by mouth daily as needed.   rosuvastatin 10 MG tablet Commonly known as: CRESTOR TAKE 1 TABLET(10 MG) BY MOUTH DAILY.   sildenafil 50 MG tablet Commonly known as: VIAGRA Take 50 mg by mouth daily as needed for erectile dysfunction.   THERAWORX RELIEF EX Apply 1 application topically daily as needed (muscle cramps / pains).   triamcinolone cream 0.1 % Commonly known as: KENALOG Apply 1 Application topically 2 (two) times daily.   Trulicity 1.5 MG/0.5ML Sopn Generic drug: Dulaglutide Inject 1.5 mg into the skin once a week.   Turmeric 500 MG Caps Take 1,000 mg by mouth once a week.         ALLERGIES: Allergies  Allergen Reactions   Codeine Nausea And Vomiting and Other (See Comments)    headache   Crestor [Rosuvastatin]     Elveated LFTs with Crestor 40mg  daily    Hydrocodone Nausea And Vomiting   Oxycodone Hcl Other (See Comments)    Headaches     REVIEW OF SYSTEMS: A  comprehensive ROS was conducted with the patient and is negative except as per HPI    OBJECTIVE:   VITAL SIGNS: BP 126/78 (BP Location: Left Arm, Patient Position: Sitting, Cuff Size: Large)   Pulse 63   Ht 5\' 10"  (1.778 m)   Wt 213 lb (96.6 kg)   SpO2 98%   BMI 30.56 kg/m    PHYSICAL EXAM:  General: Pt appears well and is in NAD  Neck: General: Supple without adenopathy or carotid bruits. Thyroid: Thyroid  size normal.  No goiter or nodules appreciated.   Lungs: Clear with good BS bilat with no rales, rhonchi, or wheezes  Heart: RRR   Abdomen:  soft, nontender  Extremities:  Lower extremities -trace pretibial edema  Neuro: MS is good with appropriate affect, pt is alert and Ox3    DM foot exam: 11/13/2022  The skin of the feet is intact without sores or ulcerations, right great toe nail discoloration  The pedal pulses are 2+ on right and 2+ on left. The sensation is intact to a screening 5.07, 10 gram monofilament bilaterally   DATA REVIEWED:  Lab Results  Component Value Date   HGBA1C 8.6 (A) 11/13/2022   HGBA1C 8.7 (A) 08/21/2022   HGBA1C 8.9 (A) 04/14/2022    Latest Reference Range & Units 10/23/22 07:20  Alkaline Phosphatase 44 - 121 IU/L 86  Albumin 3.8 - 4.9 g/dL 4.4  AST 0 - 40 IU/L 18  ALT 0 - 44 IU/L 26  Total Protein 6.0 - 8.5 g/dL 6.2  Total Bilirubin 0.0 - 1.2 mg/dL 0.6  BILIRUBIN, DIRECT 0.00 - 0.40 mg/dL 0.45  Total CHOL/HDL Ratio 0.0 - 5.0 ratio 3.2  Cholesterol, Total 100 - 199 mg/dL 409  HDL Cholesterol >81 mg/dL 38 (L)  Triglycerides 0 - 149 mg/dL 191  VLDL Cholesterol Cal 5 - 40 mg/dL 21  LDL Chol Calc (NIH) 0 - 99 mg/dL 61  (L): Data is abnormally low   In Office BG 213 mg/dL     ASSESSMENT / PLAN / RECOMMENDATIONS:   1) Type 2 Diabetes Mellitus, Poorly  controlled, With macrovascular and retinopathic  complications - Most recent A1c of 8.7 %. Goal A1c < 7.0 %.    -A1c continues to be above goal despite starting glipizide, patient  unable to verify medications stating that his wife is the one who fills his pillbox, I did explain to the patient the importance of verifying that everybody is on the same page and that he is indeed taking glipizide before breakfast and before supper -He again returns with no glucose data [employer] has not covered Dexcom nor freestyle libre - Intolerant to [employer] 4.5 mg and 3 mg, has not been able to obtain Trulicity for the past 7 months, will switch to [employer] -Patient endorses hypoglycemia in the morning, he has not been able to reduce Lantus as previously advised, because he has not been on Trulicity -I will attempt to reduce Lantus  -Not a candidate for pioglitazone due to lower extremity edema   MEDICATIONS: Continue glipizide 5 mg, twice daily Decrease Lantus  40 units daily Continue Jardiance 25 mg daily Continue metformin 1000 mg twice daily Switch Trulicity to Ozempic 0.25 mg weekly for 6 weeks then increase to 0.5 mg weekly  EDUCATION / INSTRUCTIONS: BG monitoring instructions: Patient is instructed to check his blood sugars 3 times a day, before meals. Call [place] [facility] if: BG persistently < 70  I reviewed the Rule of 15 for the treatment of hypoglycemia in detail with the patient. Literature supplied.   2) Diabetic complications:  Eye: Does  have known diabetic retinopathy.  Neuro/ Feet: Does not have known diabetic peripheral neuropathy. Renal: Patient does not have known baseline CKD. He is on an ACEI/ARB at present.  3) CAD/Dyslipidemia:   -Patient follows with cardiology  F/U in 6 months   Signed electronically by: Lyndle Herrlich, MD  [facility]  [facility] [address] [place], [place]  [address] Phone: [phone] FAX: [phone]   CC: Marcine Matar, MD [address] [place] [address] [place] [address] Phone: [phone]  Fax: [phone]    Return to Endocrinology  clinic as below: Future Appointments  Date Time Provider Department Center  11/13/2022  7:50 AM Kamren Heintzelman, Konrad Dolores, MD [facility] None  11/14/2022  9:00 AM [facility]  11/14/2022 10:00 AM [facility]  12/05/2022  8:30 AM Marcine Matar, MD [facility] None  12/16/2022  8:25 AM Louanne Skye Devoria Albe., NP [facility]

## 2022-11-13 NOTE — Patient Instructions (Addendum)
Start Ozempic 0.25 mg once weekly for 6 weeks, increase to 0.5 mg weekly  Continue Glipizide 5 mg, 1 tablet before Breakfast and 1 tablet before Supper  Decrease   Lantus 40 units daily  Continue Jardiance 25 mg daily  Continue Metformin 1000 mg twice daily     HOW TO TREAT LOW BLOOD SUGARS (Blood sugar LESS THAN 70 MG/DL) Please follow the RULE OF 15 for the treatment of hypoglycemia treatment (when your (blood sugars are less than 70 mg/dL)   STEP 1: Take 15 grams of carbohydrates when your blood sugar is low, which includes:  3-4 GLUCOSE TABS  OR 3-4 OZ OF JUICE OR REGULAR SODA OR ONE TUBE OF GLUCOSE GEL    STEP 2: RECHECK blood sugar in 15 MINUTES STEP 3: If your blood sugar is still low at the 15 minute recheck --> then, go back to STEP 1 and treat AGAIN with another 15 grams of carbohydrates.

## 2022-11-14 ENCOUNTER — Ambulatory Visit (INDEPENDENT_AMBULATORY_CARE_PROVIDER_SITE_OTHER)

## 2022-11-14 DIAGNOSIS — R55 Syncope and collapse: Secondary | ICD-10-CM

## 2022-11-14 DIAGNOSIS — E1169 Type 2 diabetes mellitus with other specified complication: Secondary | ICD-10-CM

## 2022-11-14 DIAGNOSIS — E1165 Type 2 diabetes mellitus with hyperglycemia: Secondary | ICD-10-CM

## 2022-11-14 DIAGNOSIS — I2510 Atherosclerotic heart disease of native coronary artery without angina pectoris: Secondary | ICD-10-CM

## 2022-11-14 DIAGNOSIS — Z794 Long term (current) use of insulin: Secondary | ICD-10-CM

## 2022-11-14 DIAGNOSIS — E785 Hyperlipidemia, unspecified: Secondary | ICD-10-CM

## 2022-11-14 LAB — ECHOCARDIOGRAM COMPLETE
AR max vel: 4.14 cm2
AV Area VTI: 4.03 cm2
AV Area mean vel: 3.47 cm2
AV Mean grad: 2 mmHg
AV Peak grad: 4.4 mmHg
Ao pk vel: 1.05 m/s
Area-P 1/2: 4.08 cm2
Calc EF: 61.3 %
S' Lateral: 2.89 cm
Single Plane A2C EF: 63.5 %
Single Plane A4C EF: 58.4 %

## 2022-11-17 ENCOUNTER — Other Ambulatory Visit: Payer: Self-pay

## 2022-11-17 DIAGNOSIS — R55 Syncope and collapse: Secondary | ICD-10-CM

## 2022-11-17 DIAGNOSIS — Z720 Tobacco use: Secondary | ICD-10-CM

## 2022-11-17 DIAGNOSIS — I77810 Thoracic aortic ectasia: Secondary | ICD-10-CM

## 2022-11-17 DIAGNOSIS — I2510 Atherosclerotic heart disease of native coronary artery without angina pectoris: Secondary | ICD-10-CM

## 2022-11-18 ENCOUNTER — Telehealth: Payer: Self-pay | Admitting: Cardiology

## 2022-11-18 NOTE — Telephone Encounter (Signed)
Left message for patient advising that report shows "non-smoker". Advised to call back to discuss further.

## 2022-11-18 NOTE — Telephone Encounter (Signed)
Patient states his echo report mentions that he is a smoker which is incorrect. He is requesting to have this corrected in his chart. Please return call to confirm.

## 2022-11-18 NOTE — Telephone Encounter (Signed)
Patient was actually talking about comments from Robin Searing, NP on his echocardiogram results which state that patient needs to stop smoking. Will route to [place].

## 2022-11-26 ENCOUNTER — Telehealth: Payer: Self-pay

## 2022-11-26 ENCOUNTER — Other Ambulatory Visit (HOSPITAL_COMMUNITY): Payer: Self-pay

## 2022-11-26 NOTE — Telephone Encounter (Signed)
Per PA form with patients insurance they will only cover if patient failed metformin. Provider may have to call patients plan.

## 2022-11-26 NOTE — Telephone Encounter (Signed)
PA request received via fax for Ozempic.  PA not submitted due to patient not eligible to received medication due to no contraindication/adverse events while being treated with Metformin.

## 2022-11-27 ENCOUNTER — Telehealth: Payer: Self-pay

## 2022-11-27 ENCOUNTER — Other Ambulatory Visit (HOSPITAL_COMMUNITY): Payer: Self-pay

## 2022-11-27 NOTE — Telephone Encounter (Signed)
Patient Advocate Encounter   Received notification from pt msgs that prior authorization is required for Ozempic  Submitted: 11/27/22 Key [id number]  Status is pending

## 2022-11-27 NOTE — Telephone Encounter (Signed)
PA has been submitted and documented in a new encounter

## 2022-11-28 ENCOUNTER — Ambulatory Visit (HOSPITAL_COMMUNITY)
Admission: RE | Admit: 2022-11-28 | Discharge: 2022-11-28 | Disposition: A | Discharge status: Home / Self Care | Source: Ambulatory Visit | Attending: Nurse Practitioner | Admitting: Nurse Practitioner

## 2022-11-28 DIAGNOSIS — I2510 Atherosclerotic heart disease of native coronary artery without angina pectoris: Secondary | ICD-10-CM | POA: Diagnosis present

## 2022-11-28 DIAGNOSIS — Z720 Tobacco use: Secondary | ICD-10-CM | POA: Diagnosis present

## 2022-11-28 DIAGNOSIS — I77810 Thoracic aortic ectasia: Secondary | ICD-10-CM | POA: Diagnosis present

## 2022-11-28 DIAGNOSIS — R55 Syncope and collapse: Secondary | ICD-10-CM | POA: Insufficient documentation

## 2022-11-28 MED ORDER — IOHEXOL 350 MG/ML SOLN
75.0000 mL | Freq: Once | INTRAVENOUS | Status: AC | PRN
  Administered 2022-11-28: 75 mL via INTRAVENOUS

## 2022-11-28 NOTE — Telephone Encounter (Signed)
PA has been APPROVED from 11/27/2022 until further notice through [employer].

## 2022-12-02 ENCOUNTER — Telehealth: Payer: Self-pay | Admitting: Cardiology

## 2022-12-02 ENCOUNTER — Ambulatory Visit: Admitting: Nurse Practitioner

## 2022-12-02 NOTE — Telephone Encounter (Signed)
Left message for patient to call back  

## 2022-12-02 NOTE — Telephone Encounter (Signed)
Patient calling in because on his CT results from 5/31 states that he is an active smoker. Patient states that is not true and would like for that to be removed. Please advise

## 2022-12-03 NOTE — Telephone Encounter (Signed)
Left detailed message informing the patient that his chart has been updated and tobacco smoker has been removed from his chart. Requested a call back with any questions or concerns.

## 2022-12-04 NOTE — Telephone Encounter (Signed)
Patient is questioning his after visit summary to his CT Scan he has done on 5/31.  He states it says Tobacco abuse.  He wants to know if there is a way to correct that after visit summary. He is worried because insurance company will see that and it's not true. I did advise him of message below.

## 2022-12-05 ENCOUNTER — Telehealth: Payer: Self-pay | Admitting: Internal Medicine

## 2022-12-05 ENCOUNTER — Encounter: Payer: Self-pay | Admitting: Internal Medicine

## 2022-12-05 ENCOUNTER — Ambulatory Visit: Attending: Internal Medicine | Admitting: Internal Medicine

## 2022-12-05 VITALS — BP 129/81 | HR 61 | Temp 98.2°F | Ht 70.0 in | Wt 216.0 lb

## 2022-12-05 DIAGNOSIS — E785 Hyperlipidemia, unspecified: Secondary | ICD-10-CM

## 2022-12-05 DIAGNOSIS — Z23 Encounter for immunization: Secondary | ICD-10-CM

## 2022-12-05 DIAGNOSIS — Z794 Long term (current) use of insulin: Secondary | ICD-10-CM

## 2022-12-05 DIAGNOSIS — I77810 Thoracic aortic ectasia: Secondary | ICD-10-CM

## 2022-12-05 DIAGNOSIS — E1159 Type 2 diabetes mellitus with other circulatory complications: Secondary | ICD-10-CM | POA: Diagnosis not present

## 2022-12-05 DIAGNOSIS — E1169 Type 2 diabetes mellitus with other specified complication: Secondary | ICD-10-CM | POA: Diagnosis not present

## 2022-12-05 DIAGNOSIS — I2510 Atherosclerotic heart disease of native coronary artery without angina pectoris: Secondary | ICD-10-CM | POA: Diagnosis not present

## 2022-12-05 DIAGNOSIS — H3500 Unspecified background retinopathy: Secondary | ICD-10-CM

## 2022-12-05 DIAGNOSIS — L729 Follicular cyst of the skin and subcutaneous tissue, unspecified: Secondary | ICD-10-CM

## 2022-12-05 MED ORDER — NITROGLYCERIN 0.4 MG SL SUBL
0.4000 mg | SUBLINGUAL_TABLET | SUBLINGUAL | 2 refills | Status: AC | PRN
Start: 2022-12-05 — End: ?

## 2022-12-05 MED ORDER — CLOPIDOGREL BISULFATE 75 MG PO TABS: 75.0000 mg | ORAL_TABLET | Freq: Every day | ORAL | 2 refills | Status: DC

## 2022-12-05 MED ORDER — METOPROLOL SUCCINATE ER 25 MG PO TB24: 12.5000 mg | ORAL_TABLET | Freq: Every day | ORAL | 1 refills | Status: DC

## 2022-12-05 NOTE — Progress Notes (Signed)
Patient ID: CARBON TREADWAY, male    DOB: September 09, 1970  MRN: [id number]  CC: Diabetes (DM f/u. Med refills /Mass on L side of face in orbital area. /Issues with obtaining Trulicity currently approved - waiting for ozempic - financial concerns. Poss alternative?/Yes to shingles vax)   Subjective: Paul [name] is a 52 y.o. male who presents for chronic disease management His concerns today include:  Hx of DM, HL, OSA on CPAP, ED (Viagra through [place]), CAD (3 vessle CAD with 80% mid-LAD s/p DES).     DM: Lab Results  Component Value Date   HGBA1C 8.6 (A) 11/13/2022  Saw Dr. Brooks Sailors last month.  Trulicity changed to Ozempic due to shortage on the Trulicity through [employer].  Had been out of Trulicity for 4-5 mths.  He has not received the Ozempic as yet; just approved but double out of pocket cost for what his co-pay was for Trulicity which was about about $35.  Lantus decreased to 40 units daily.  He was continued on Jardiance and metformin 1 g twice a day. -has to better with eating habits.  Not exercising as much because he has been busy at work -referred to ophthalmologist on last visit due to retinopathy seen on exam by optometrist.  Referred to [facility].  They tried contacting him x 2 unsuccessfully  CAD/HL:   Saw cardiology NP 09/2022. Reports there was a statement in his chart about him being a tobacco user; he never smoked.  He contacted the NP via Mychart and request to have corrected; partially corrected as he saw it mentioned again on recent cardiac study. Otherwise, doing well.  No CP, SOB.  Has not had to use SL Nitro recently.  Dilated aortic root seen on recent CT of the chest that was done by cardiology.  Stable in size.  Plan to repeat study in 1 year.  Patient wants to know whether it is okay for him to resume weightlifting.  Lifts weights over 200 pounds. Reports compliance with taking metoprolol 12.5 mg daily, aspirin, Plavix and Crestor 10 mg daily.  Has  what he thought was a pimple on the side of the left eye x 2 weeks.  He picked at it but it has not resolved completely.  Patient Active Problem List   Diagnosis Date Noted   Dropped heart beats 02/27/2022   Diabetes mellitus (HCC) 12/03/2021   Dyslipidemia 12/03/2021   Type 2 diabetes mellitus with hyperglycemia, with long-term current use of insulin (HCC) 12/03/2021   Type 2 diabetes mellitus with retinopathy, with long-term current use of insulin (HCC) 12/03/2021   Acute rhinitis 07/24/2020   Fatty liver 11/24/2019   CAD (coronary artery disease) 09/08/2019   Erectile dysfunction 08/12/2016   Polyp of colon 08/12/2016   Obstructive sleep apnea 04/03/2015   Onychomycosis of right great toe 04/03/2015   Diabetes type 2, uncontrolled 01/25/2014   Dyslipidemia associated with type 2 diabetes mellitus (HCC) 01/25/2014   Skin tag 01/25/2014     Current Outpatient Medications on File Prior to Visit  Medication Sig Dispense Refill   aspirin EC 81 MG tablet Take 1 tablet (81 mg total) by mouth daily. 90 tablet 3   empagliflozin (JARDIANCE) 25 MG TABS tablet Take 1 tablet (25 mg total) by mouth daily before breakfast. 90 tablet 3   furosemide (LASIX) 20 MG tablet 1 tab PO daily PRN for swelling in legs 30 tablet 1   glipiZIDE (GLUCOTROL) 5 MG tablet Take 1 tablet (  5 mg total) by mouth 2 (two) times daily before a meal. 180 tablet 3   Homeopathic Products (THERAWORX RELIEF EX) Apply 1 application topically daily as needed (muscle cramps / pains).     insulin glargine (LANTUS SOLOSTAR) 100 UNIT/ML Solostar Pen Inject 40 Units into the skin daily. 45 mL 3   Insulin Pen Needle (B-D UF III MINI PEN NEEDLES) 31G X 5 MM MISC Inject 1 Device into the skin daily in the afternoon. 5 mm 31G 3/15.  Use as directed 100 each 3   metFORMIN (GLUCOPHAGE) 1000 MG tablet Take 1 tablet (1,000 mg total) by mouth 2 (two) times daily with a meal. 180 tablet 3   omeprazole (PRILOSEC) 20 MG capsule Take 1 capsule (20  mg total) by mouth daily as needed. 30 capsule 3   rosuvastatin (CRESTOR) 10 MG tablet TAKE 1 TABLET(10 MG) BY MOUTH DAILY. 90 tablet 1   Semaglutide,0.25 or 0.5MG /DOS, 2 MG/3ML SOPN Inject 0.5 mg into the skin once a week. 9 mL 3   sildenafil (VIAGRA) 50 MG tablet Take 50 mg by mouth daily as needed for erectile dysfunction.     triamcinolone cream (KENALOG) 0.1 % Apply 1 Application topically 2 (two) times daily. 30 g 0   Turmeric 500 MG CAPS Take 1,000 mg by mouth once a week.     No current facility-administered medications on file prior to visit.    Allergies  Allergen Reactions   Codeine Nausea And Vomiting and Other (See Comments)    headache   Crestor [Rosuvastatin]     Elveated LFTs with Crestor 40mg  daily    Hydrocodone Nausea And Vomiting   Oxycodone Hcl Other (See Comments)    Headaches    Social History   Socioeconomic History   Marital status: Married    Spouse name: Not on file   Number of children: 2   Years of education: Not on file   Highest education level: Associate degree: occupational, Scientist, product/process development, or vocational program  Occupational History   Occupation: Engineer, materials  Tobacco Use   Smoking status: Never   Smokeless tobacco: Never  Vaping Use   Vaping Use: Never used  Substance and Sexual Activity   Alcohol use: Yes    Comment: Social   Drug use: No   Sexual activity: Yes  Other Topics Concern   Not on file  Social History Narrative   Not on file   Social Determinants of Health   Financial Resource Strain: Low Risk  (12/01/2022)   Overall Financial Resource Strain (CARDIA)    Difficulty of Paying Living Expenses: Not very hard  Food Insecurity: No Food Insecurity (12/01/2022)   Hunger Vital Sign    Worried About Running Out of Food in the Last Year: Never true    Ran Out of Food in the Last Year: Never true  Transportation Needs: No Transportation Needs (12/01/2022)   PRAPARE - Administrator, Civil Service (Medical): No    Lack  of Transportation (Non-Medical): No  Physical Activity: Insufficiently Active (12/01/2022)   Exercise Vital Sign    Days of Exercise per Week: 4 days    Minutes of Exercise per Session: 20 min  Stress: No Stress Concern Present (12/01/2022)   [employer] of Occupational Health - Occupational Stress Questionnaire    Feeling of Stress : Not at all  Social Connections: Unknown (12/01/2022)   Social Connection and Isolation Panel [NHANES]    Frequency of Communication with Friends and Family: Once  a week    Frequency of Social Gatherings with Friends and Family: Patient declined    Attends Religious Services: Patient declined    Database administrator or Organizations: No    Attends Engineer, structural: Not on file    Marital Status: Married  Catering manager Violence: Not on file    Family History  Problem Relation Age of Onset   Hypertension Father    Diabetes Mother    Sleep apnea Other    Heart attack Maternal Grandfather    Heart attack Paternal Grandfather    Cancer Neg Hx    Colon cancer Neg Hx    Esophageal cancer Neg Hx    Stomach cancer Neg Hx    Rectal cancer Neg Hx     Past Surgical History:  Procedure Laterality Date   COLONOSCOPY     CORONARY STENT INTERVENTION N/A 08/26/2019   Procedure: CORONARY STENT INTERVENTION;  Surgeon: Iran Ouch, MD;  Location: [facility];  Service: Cardiovascular;  Laterality: N/A;   KNEE ARTHROSCOPY Right    LEFT HEART CATH AND CORONARY ANGIOGRAPHY N/A 08/26/2019   Procedure: LEFT HEART CATH AND CORONARY ANGIOGRAPHY;  Surgeon: Iran Ouch, MD;  Location: [facility];  Service: Cardiovascular;  Laterality: N/A;   UMBILICAL HERNIA REPAIR      ROS: Review of Systems Negative except as stated above  PHYSICAL EXAM: BP 129/81 (BP Location: Left Arm, Patient Position: Sitting, Cuff Size: Large)   Pulse 61   Temp 98.2 F (36.8 C) (Oral)   Ht 5\' 10"  (1.778 m)   Wt 216 lb (98 kg)   SpO2 98%   BMI  30.99 kg/m   Physical Exam   General appearance - alert, well appearing, and in no distress Mental status - normal mood, behavior, speech, dress, motor activity, and thought processes Neck - supple, no significant adenopathy Chest - clear to auscultation, no wheezes, rales or rhonchi, symmetric air entry Heart - normal rate, regular rhythm, normal S1, S2, no murmurs, rubs, clicks or gallops Extremities - peripheral pulses normal, no pedal edema, no clubbing or cyanosis Skin -patient has less than 1 cm soft and slightly raised area in the lateral corner of the left eye just below the eyelid.  It is not erythematous or tender to touch.     Latest Ref Rng & Units 10/23/2022    7:20 AM 09/29/2022    6:04 AM 01/06/2022    9:37 AM  CMP  Glucose 70 - 99 mg/dL  130    BUN 6 - 20 mg/dL  18    Creatinine 8.65 - 1.24 mg/dL  7.84    Sodium 696 - 295 mmol/L  140    Potassium 3.5 - 5.1 mmol/L  4.0    Chloride 98 - 111 mmol/L  104    CO2 22 - 32 mmol/L  24    Calcium 8.9 - 10.3 mg/dL  9.3    Total Protein 6.0 - 8.5 g/dL 6.2  6.7    Total Bilirubin 0.0 - 1.2 mg/dL 0.6  0.8    Alkaline Phos 44 - 121 IU/L 86  74    AST 0 - 40 IU/L 18  25    ALT 0 - 44 IU/L 26  32  37    Lipid Panel     Component Value Date/Time   CHOL 120 10/23/2022 0720   TRIG 116 10/23/2022 0720   HDL 38 (L) 10/23/2022 0720   CHOLHDL  3.2 10/23/2022 0720   CHOLHDL 3 12/02/2021 1213   VLDL 21.8 12/02/2021 1213   LDLCALC 61 10/23/2022 0720    CBC    Component Value Date/Time   WBC 7.4 09/29/2022 0604   RBC 5.29 09/29/2022 0604   HGB 16.1 09/29/2022 0604   HGB 15.5 01/24/2021 1021   HCT 45.4 09/29/2022 0604   HCT 46.1 01/24/2021 1021   PLT 220 09/29/2022 0604   PLT 191 01/24/2021 1021   MCV 85.8 09/29/2022 0604   MCV 89 01/24/2021 1021   MCH 30.4 09/29/2022 0604   MCHC 35.5 09/29/2022 0604   RDW 12.3 09/29/2022 0604   RDW 12.5 01/24/2021 1021   LYMPHSABS 1.4 09/29/2022 0604   LYMPHSABS 2.0 01/24/2021 1021    MONOABS 0.7 09/29/2022 0604   EOSABS 0.4 09/29/2022 0604   EOSABS 0.5 (H) 01/24/2021 1021   BASOSABS 0.1 09/29/2022 0604   BASOSABS 0.1 01/24/2021 1021    ASSESSMENT AND PLAN:  1. Type 2 diabetes mellitus with other circulatory complication, with long-term current use of insulin (HCC) Encourage healthy eating habits and regular exercise. I recommend that he speaks with [facility] to see whether they have the Trulicity 0.75 mg or 1.5 mg in stock.  If they do, he can touch base with Dr. Brooks Sailors to let her know that he would prefer to go back on Trulicity at 1 of these doses since the out-of-pocket cost for him is a bit too much on the Ozempic.  Otherwise, continue glipizide, Lantus, Jardiance and metformin. - Microalbumin / creatinine urine ratio  2. Coronary artery disease involving native coronary artery of native heart without angina pectoris Stable.  Continue Plavix, aspirin, metoprolol, and Crestor - clopidogrel (PLAVIX) 75 MG tablet; Take 1 tablet (75 mg total) by mouth daily.  Dispense: 90 tablet; Refill: 2 - metoprolol succinate (TOPROL XL) 25 MG 24 hr tablet; Take 0.5 tablets (12.5 mg total) by mouth daily.  Dispense: 90 tablet; Refill: 1 - nitroGLYCERIN (NITROSTAT) 0.4 MG SL tablet; Place 1 tablet (0.4 mg total) under the tongue every 5 (five) minutes as needed.  Dispense: 25 tablet; Refill: 2  3. Dyslipidemia associated with type 2 diabetes mellitus (HCC) Continue Crestor.  4. Retinopathy I have printed [facility] information including phone number and location.  I have encouraged him to call and schedule an appointment.  5. Subcutaneous cyst Observe for another few weeks.  If this does not resolve or increases in size, he will let me know and we will refer to a dermatologist.  6. Dilated aortic root (HCC) Stable in size.  Followed by cardiology.  Plan for repeat CT of the chest in 1 year.  Advised that he asked the cardiologist about whether it is safe  for him to engage in heavy weight lifting/bench presses of over 200 pounds. Also advised that he touch base with cardiology NP again about making sure that claim of tobacco use is removed from his note  7. Need for shingles vaccine Given first Shingrix vaccine today.  Advised patient that the vaccine can cause some redness and swelling at the injection site.  Also discussed rare but serious side effect of Gilliam Barr syndrome.     Patient was given the opportunity to ask questions.  Patient verbalized understanding of the plan and was able to repeat key elements of the plan.   This documentation was completed using Paediatric nurse.  Any transcriptional errors are unintentional.  Orders Placed This Encounter  Procedures  Varicella-zoster vaccine IM   Microalbumin / creatinine urine ratio     Requested Prescriptions   Signed Prescriptions Disp Refills   clopidogrel (PLAVIX) 75 MG tablet 90 tablet 2    Sig: Take 1 tablet (75 mg total) by mouth daily.   metoprolol succinate (TOPROL XL) 25 MG 24 hr tablet 90 tablet 1    Sig: Take 0.5 tablets (12.5 mg total) by mouth daily.   nitroGLYCERIN (NITROSTAT) 0.4 MG SL tablet 25 tablet 2    Sig: Place 1 tablet (0.4 mg total) under the tongue every 5 (five) minutes as needed.    Return in about 4 months (around 04/06/2023).  Jonah Blue, MD, FACP

## 2022-12-05 NOTE — Telephone Encounter (Signed)
Call returned to [facility].  They state stated that they just needed to verify that I was not related to the patient in prescribing Plavix.  Informed them that I was not.

## 2022-12-05 NOTE — Patient Instructions (Signed)
Call [facility] (ph # [phone] address [address]) to schedule appointment.

## 2022-12-05 NOTE — Telephone Encounter (Signed)
Copied from CRM [id number]. Topic: General - Other >> Dec 05, 2022 11:17 AM Franchot Heidelberg wrote: Reason for CRM: Whitney from [facility] called to report that the pharmacist needs to speak to PCP directly regarding 3 medications that were ordered   Best contact: [phone]   Reference: [id number]

## 2022-12-06 LAB — MICROALBUMIN / CREATININE URINE RATIO
Creatinine, Urine: 75.7 mg/dL
Microalb/Creat Ratio: <4 mg/g creat (ref 0–29)
Microalbumin, Urine: <3 ug/mL

## 2022-12-12 ENCOUNTER — Ambulatory Visit: Admitting: Internal Medicine

## 2022-12-15 NOTE — Progress Notes (Unsigned)
Office Visit    Patient Name: Paul [name] [name] Date of Encounter: 12/15/2022  Primary Care Provider:  Marcine [name], Paul [name] Primary Cardiologist:  Paul [name] [name], Paul [name] Primary Electrophysiologist: None   Past Medical History    Past Medical History:  Diagnosis Date   Diabetes mellitus without complication (HCC)    Fatty liver    abnormal CT 2014   Heart disease    Hyperlipidemia    Kidney stones    Sleep apnea    wears CPAP   Past Surgical History:  Procedure Laterality Date   COLONOSCOPY     CORONARY STENT INTERVENTION N/A 08/26/2019   Procedure: CORONARY STENT INTERVENTION;  Surgeon: Paul [name] [name], Paul [name];  Location: [facility];  Service: Cardiovascular;  Laterality: N/A;   KNEE ARTHROSCOPY Right    LEFT HEART CATH AND CORONARY ANGIOGRAPHY N/A 08/26/2019   Procedure: LEFT HEART CATH AND CORONARY ANGIOGRAPHY;  Surgeon: Paul [name] [name], Paul [name];  Location: [facility];  Service: Cardiovascular;  Laterality: N/A;   UMBILICAL HERNIA REPAIR      Allergies  Allergies  Allergen Reactions   Codeine Nausea And Vomiting and Other (See Comments)    headache   Crestor [Rosuvastatin]     Elveated LFTs with Crestor 40mg  daily    Hydrocodone Nausea And Vomiting   Oxycodone Hcl Other (See Comments)    Headaches     History of Present Illness    Paul [name] [name] is a 52 y.o. male with PMH of CAD s/p LHC in 2021 with DES/PCI to mid LAD, HLD, DM, OSA (on CPAP), never used tobacco, who presents today for 27-month follow-up.  [name] [name] was initially seen in 2015 to establish care with Paul [name] [name] due to strong family history of CAD. He underwent a exercise treadmill stress test that was normal with good exercise tolerance and no ischemia. 2D echo was completed 04/2013 with normal LV function and no valve disease. He was seen in 07/2019 by Paul [name] [name] for complaint of chest pain and syncope. He presented to the ED with normal QTc and labs. He was seen in follow-up by Dr.  Mayford [name] and underwent a coronary CTA that revealed calcium score of 8 with moderate atherosclerosis of the mid to distal LAD and FFR showing flow-limiting lesion in the LAD and D1. He underwent outpatient left heart cath that revealed significant disease in the mid LAD that was treated with PCI/DES x 1 to the LAD. He underwent a PET stress test on 04/08/2022 that was normal with no evidence of ischemia or infarct. He was seen in the ED 09/29/2022 when he presented with a near syncopal episode.  He reports waking up with profound diaphoresis that lasted several minutes.   [name] presents today for 1 month follow-up.  Since last being seen in the office patient reports that he has been doing well with no new cardiac complaints since his previous visit.  He denies any episodes of presyncope and has been compliant with his current medication regimen.  Patient blood pressure today was controlled at 120/80 and heart rate was 79 bpm.  He was prescribed an event monitor that fell off and was not able to be completed.  We will have him complete an additional monitor today.  During his previous visit and there was made in one of his result notes that stated that he was a smoker.  This area was corrected and patient has never been a smoker or smokeless tobacco user.  We were able to review his previous studies that were ordered and patient had all questions answered to his satisfaction.  Patient denies chest pain, palpitations, dyspnea, PND, orthopnea, nausea, vomiting, dizziness, syncope, edema, weight gain, or early satiety.    Home Medications    Current Outpatient Medications  Medication Sig Dispense Refill   aspirin EC 81 MG tablet Take 1 tablet (81 mg total) by mouth daily. 90 tablet 3   clopidogrel (PLAVIX) 75 MG tablet Take 1 tablet (75 mg total) by mouth daily. 90 tablet 2   empagliflozin (JARDIANCE) 25 MG TABS tablet Take 1 tablet (25 mg total) by mouth daily before breakfast. 90 tablet 3   furosemide  (LASIX) 20 MG tablet 1 tab PO daily PRN for swelling in legs 30 tablet 1   glipiZIDE (GLUCOTROL) 5 MG tablet Take 1 tablet (5 mg total) by mouth 2 (two) times daily before a meal. 180 tablet 3   Homeopathic Products (THERAWORX RELIEF EX) Apply 1 application topically daily as needed (muscle cramps / pains).     insulin glargine (LANTUS SOLOSTAR) 100 UNIT/ML Solostar Pen Inject 40 Units into the skin daily. 45 mL 3   Insulin Pen Needle (B-D UF III MINI PEN NEEDLES) 31G X 5 MM MISC Inject 1 Device into the skin daily in the afternoon. 5 mm 31G 3/15.  Use as directed 100 each 3   metFORMIN (GLUCOPHAGE) 1000 MG tablet Take 1 tablet (1,000 mg total) by mouth 2 (two) times daily with a meal. 180 tablet 3   metoprolol succinate (TOPROL XL) 25 MG 24 hr tablet Take 0.5 tablets (12.5 mg total) by mouth daily. 90 tablet 1   nitroGLYCERIN (NITROSTAT) 0.4 MG SL tablet Place 1 tablet (0.4 mg total) under the tongue every 5 (five) minutes as needed. 25 tablet 2   omeprazole (PRILOSEC) 20 MG capsule Take 1 capsule (20 mg total) by mouth daily as needed. 30 capsule 3   rosuvastatin (CRESTOR) 10 MG tablet TAKE 1 TABLET(10 MG) BY MOUTH DAILY. 90 tablet 1   Semaglutide,0.25 or 0.5MG /DOS, 2 MG/3ML SOPN Inject 0.5 mg into the skin once a week. 9 mL 3   sildenafil (VIAGRA) 50 MG tablet Take 50 mg by mouth daily as needed for erectile dysfunction.     triamcinolone cream (KENALOG) 0.1 % Apply 1 Application topically 2 (two) times daily. 30 g 0   Turmeric 500 MG CAPS Take 1,000 mg by mouth once a week.     No current facility-administered medications for this visit.     Review of Systems  Please see the history of present illness.   All other systems reviewed and are otherwise negative except as noted above.  Physical Exam    Wt Readings from Last 3 Encounters:  12/05/22 216 lb (98 kg)  11/13/22 213 lb (96.6 kg)  10/16/22 215 lb (97.5 kg)   ZO:XWRUE were no vitals filed for this visit.,There is no height or  weight on file to calculate BMI.  Constitutional:      Appearance: Healthy appearance. Not in distress.  Neck:     Vascular: JVD normal.  Pulmonary:     Effort: Pulmonary effort is normal.     Breath sounds: No wheezing. No rales. Diminished in the bases Cardiovascular:     Normal rate. Regular rhythm. Normal S1. Normal S2.      Murmurs: There is no murmur.  Edema:    Peripheral edema absent.  Abdominal:     Palpations: Abdomen is soft  non tender. There is no hepatomegaly.  Skin:    General: Skin is warm and dry.  Neurological:     General: No focal deficit present.     Mental Status: Alert and oriented to person, place and time.     Cranial Nerves: Cranial nerves are intact.  EKG/LABS/ Recent Cardiac Studies    ECG personally reviewed by me today -none completed today  Cardiac Studies & Procedures   CARDIAC CATHETERIZATION  CARDIAC CATHETERIZATION 08/26/2019  Narrative  The left ventricular systolic function is normal.  LV end diastolic pressure is normal.  The left ventricular ejection fraction is 55-65% by visual estimate.  1st Diag lesion is 60% stenosed.  Mid LAD-1 lesion is 40% stenosed.  Mid LAD-2 lesion is 80% stenosed.  Post intervention, there is a 0% residual stenosis.  A drug-eluting stent was successfully placed using a STENT RESOLUTE ONYX 2.5X22.  1.  Significant one-vessel coronary artery disease involving the mid to distal LAD.  The left circumflex is very small.  However, the RCA is very large and super dominant.  There is also 60% stenosis in a large first diagonal and moderate mid LAD stenosis 2.  Normal LV systolic function and left ventricular end-diastolic pressure. 3.  Successful angioplasty and drug-eluting stent placement to the mid/distal LAD.  Recommendations: Dual antiplatelet therapy for at least 6 months. The patient needs aggressive treatment of his risk factors especially with the presence of residual coronary artery disease.  He  needs to be in a potent statin and I elected to switch him to rosuvastatin from pravastatin. The patient is a candidate for same-day discharge.  Findings Coronary Findings Diagnostic  Dominance: Right  Left Main Vessel is angiographically normal.  Left Anterior Descending Mid LAD-1 lesion is 40% stenosed. Mid LAD-2 lesion is 80% stenosed. The lesion is type C. The lesion was not previously treated.  First Diagonal Branch 1st Diag lesion is 60% stenosed.  Left Circumflex Vessel is small.  Right Coronary Artery Vessel is large. Vessel is angiographically normal.  Right Posterior Atrioventricular Artery Vessel is large in size. Vessel is angiographically normal.  Third Right Posterolateral Branch Vessel is large in size. Vessel is angiographically normal.  Intervention  Mid LAD-2 lesion Stent Lesion length:  21 mm. CATHETER LAUNCHER 6FR EBU3.5 guide catheter was inserted. Lesion crossed with guidewire using a WIRE RUNTHROUGH .[id number]. Pre-stent angioplasty was performed using a BALLOON SAPPHIRE 2.0X15. Maximum pressure:  6 atm. Inflation time:  30 sec. A drug-eluting stent was successfully placed using a STENT RESOLUTE ONYX 2.5X22. Maximum pressure: 12 atm. Inflation time: 20 sec. Post-stent angioplasty was performed using a BALLOON SAPPHIRE [place] [id number]. Maximum pressure:  14 atm. Inflation time:  20 sec. Post-Intervention Lesion Assessment The intervention was successful. Pre-interventional TIMI flow is 3. Post-intervention TIMI flow is 3. No complications occurred at this lesion. There is a 0% residual stenosis post intervention.   STRESS TESTS  NM PET CT CARDIAC PERFUSION MULTI W/ABSOLUTE BLOODFLOW 04/08/2022  Narrative   The study is normal. The study is low risk.   LV perfusion is normal. There is no evidence of ischemia. There is no evidence of infarction.   Rest left ventricular function is normal. Rest EF: 55 %. Stress left ventricular function is normal. Stress EF:  57 %. End diastolic cavity size is normal. End systolic cavity size is normal.   Myocardial blood flow was computed to be 0.41ml/g/min at rest and 2.38ml/g/min at stress. Global myocardial blood flow reserve was 4.12 and was  normal.   Coronary calcium was present on the attenuation correction CT images. Moderate coronary calcifications were present. Coronary calcifications were present in the left anterior descending artery distribution(s).  CLINICAL DATA:  This over-read does not include interpretation of cardiac or coronary anatomy or pathology. The cardiac PET-CT interpretation by the cardiologist is attached.  COMPARISON:  Cardiac CT 08/19/2019  FINDINGS: Vascular: The thoracic aorta is normal in caliber. Coronary artery calcifications are noted.  Mediastinum/Nodes: No mediastinal or hilar mass or lymphadenopathy. The esophagus is grossly.  Lungs/Pleura: No worrisome pulmonary lesions or pulmonary nodules. No pleural effusion.  Upper Abdomen: No significant upper abdominal findings.  Musculoskeletal: No significant bony findings.  IMPRESSION: No significant extracardiac findings.   Electronically Signed By: Rudie Meyer M.D. On: 04/08/2022 10:30   ECHOCARDIOGRAM  ECHOCARDIOGRAM COMPLETE 11/14/2022  Narrative ECHOCARDIOGRAM REPORT    Patient Name:   KRYSTOFER CONNON Date of Exam: 11/14/2022 Medical Rec #:  [id number]         Height:       70.0 in Accession #:    [id number]        Weight:       213.0 lb Date of Birth:  1970-09-30        BSA:          2.144 m Patient Age:    51 years          BP:           146/89 mmHg Patient Gender: M                 HR:           68 bpm. Exam Location:  Outpatient  Procedure: 2D Echo, 3D Echo, Cardiac Doppler, Color Doppler and Strain Analysis  Indications:    R06.9 DOE; R42 Lightheaded; R60.0 Lower extremity edema  History:        Patient has prior history of Echocardiogram examinations, most recent 07/22/2019. CAD,  Signs/Symptoms:Dyspnea, Edema and Dizziness/Lightheadedness; Risk Factors:Diabetes, Dyslipidemia, Non-Smoker and Sleep Apnea. Patient denies chest pain. He does have DOE with mild leg edema. He has had a couple dizziness/pre-syncopal episodes.  Sonographer:    Carlos American RVT, RDCS (AE), RDMS Referring Phys: [phone] [place], JR Atsushi Yom  IMPRESSIONS   1. Left ventricular ejection fraction, by estimation, is 60 to 65%. Left ventricular ejection fraction by 3D volume is 62 %. The left ventricle has normal function. The left ventricle has no regional wall motion abnormalities. Left ventricular diastolic parameters are consistent with Grade I diastolic dysfunction (impaired relaxation). 2. Normal RV free wall strain at -28%. Right ventricular systolic function is normal. The right ventricular size is normal. Tricuspid regurgitation signal is inadequate for assessing PA pressure. 3. The mitral valve is abnormal. Trivial mitral valve regurgitation. 4. The aortic valve is tricuspid. Aortic valve regurgitation is not visualized. Aortic valve sclerosis is present, with no evidence of aortic valve stenosis. 5. Aortic dilatation noted. There is mild dilatation of the aortic root, measuring 42 mm. 6. The inferior vena cava is normal in size with greater than 50% respiratory variability, suggesting right atrial pressure of 3 mmHg.  Comparison(s): 07/22/2019: LVEF 55-60%, AOR 38mm.  FINDINGS Left Ventricle: Left ventricular ejection fraction, by estimation, is 60 to 65%. Left ventricular ejection fraction by 3D volume is 62 %. The left ventricle has normal function. The left ventricle has no regional wall motion abnormalities. The left ventricular internal cavity size was normal in size. There is no left ventricular  hypertrophy. Left ventricular diastolic parameters are consistent with Grade I diastolic dysfunction (impaired relaxation). Indeterminate filling pressures.  Right Ventricle: Normal RV free wall  strain at -28%. The right ventricular size is normal. No increase in right ventricular wall thickness. Right ventricular systolic function is normal. Tricuspid regurgitation signal is inadequate for assessing PA pressure.  Left Atrium: Left atrial size was normal in size.  Right Atrium: Right atrial size was normal in size.  Pericardium: There is no evidence of pericardial effusion.  Mitral Valve: The mitral valve is abnormal. There is mild calcification of the anterior and posterior mitral valve leaflet(s). Trivial mitral valve regurgitation.  Tricuspid Valve: The tricuspid valve is normal in structure. Tricuspid valve regurgitation is not demonstrated.  Aortic Valve: The aortic valve is tricuspid. Aortic valve regurgitation is not visualized. Aortic valve sclerosis is present, with no evidence of aortic valve stenosis. Aortic valve mean gradient measures 2.0 mmHg. Aortic valve peak gradient measures 4.4 mmHg. Aortic valve area, by VTI measures 4.03 cm.  Pulmonic Valve: The pulmonic valve was normal in structure. Pulmonic valve regurgitation is not visualized.  Aorta: Aortic dilatation noted. There is mild dilatation of the aortic root, measuring 42 mm.  Venous: The inferior vena cava is normal in size with greater than 50% respiratory variability, suggesting right atrial pressure of 3 mmHg.  IAS/Shunts: No atrial level shunt detected by color flow Doppler.   LEFT VENTRICLE PLAX 2D LVIDd:         4.26 cm         Diastology LVIDs:         2.89 cm         LV e' medial:    9.36 cm/s LV PW:         1.07 cm         LV E/e' medial:  6.9 LV IVS:        1.07 cm         LV e' lateral:   9.57 cm/s LVOT diam:     2.50 cm         LV E/e' lateral: 6.8 LV SV:         87 LV SV Index:   41 LVOT Area:     4.91 cm        3D Volume EF LV 3D EF:    Left ventricul LV Volumes (MOD)                            ar LV vol d, MOD    72.3 ml                    ejection A2C:                                         fraction LV vol d, MOD    63.9 ml                    by 3D A4C:                                        volume is LV vol s, MOD    26.4 ml  62 %. A2C: LV vol s, MOD    26.6 ml A4C:                           3D Volume EF: LV SV MOD A2C:   45.9 ml       3D EF:        62 % LV SV MOD A4C:   63.9 ml       LV EDV:       105 ml LV SV MOD BP:    42.1 ml       LV ESV:       40 ml LV SV:        65 ml  RIGHT VENTRICLE RV S prime:     14.00 cm/s TAPSE (M-mode): 2.9 cm  LEFT ATRIUM             Index        RIGHT ATRIUM           Index LA diam:        3.80 cm 1.77 cm/m   RA Area:     17.50 cm LA Vol (A2C):   71.5 ml 33.35 ml/m  RA Volume:   50.30 ml  23.46 ml/m LA Vol (A4C):   43.5 ml 20.29 ml/m LA Biplane Vol: 58.2 ml 27.15 ml/m AORTIC VALVE                    PULMONIC VALVE AV Area (Vmax):    4.14 cm     PV Vmax:       0.83 m/s AV Area (Vmean):   3.47 cm     PV Peak grad:  2.8 mmHg AV Area (VTI):     4.03 cm AV Vmax:           105.00 cm/s AV Vmean:          73.800 cm/s AV VTI:            0.217 m AV Peak Grad:      4.4 mmHg AV Mean Grad:      2.0 mmHg LVOT Vmax:         88.50 cm/s LVOT Vmean:        52.200 cm/s LVOT VTI:          0.178 m LVOT/AV VTI ratio: 0.82  AORTA Ao Root diam: 4.20 cm Ao Asc diam:  3.70 cm Ao Arch diam: 3.4 cm  MITRAL VALVE MV Area (PHT): 4.08 cm    SHUNTS MV Decel Time: 186 msec    Systemic VTI:  0.18 m MV E velocity: 64.80 cm/s  Systemic Diam: 2.50 cm MV A velocity: 59.20 cm/s MV E/A ratio:  1.09  Zoila Shutter Paul [name] Electronically signed by Zoila Shutter Paul [name] Signature Date/Time: 11/14/2022/10:26:51 AM    Final    MONITORS  CARDIAC EVENT MONITOR 08/29/2019  Narrative  Sinus bradycardia, normal sinus rhythm and sinus tachycardia. The average heart rate was78bpm, and ranged from 52 to 165bpm  Occasional PVCs.  Nonsustained ventricular tachycardia up to 8 beats.   CT SCANS  CT CORONARY MORPH W/CTA COR W/SCORE  08/19/2019  Addendum 08/19/2019  8:15 PM ADDENDUM REPORT: 08/19/2019 20:13  EXAM: Cardiac/Coronary  CT  TECHNIQUE: The patient was scanned on a [employer].  FINDINGS: A 120 kV prospective scan was triggered in the descending thoracic aorta at 111 HU's. Axial non-contrast 3 mm slices were carried out through the heart.  The data set was analyzed on a dedicated work station and scored using the Agatson method. Gantry rotation speed was 250 msecs and collimation was .6 mm. No beta blockade and 0.8 mg of sl NTG was given. The 3D data set was reconstructed in 5% intervals of the 67-82 % of the R-R cycle. Diastolic phases were analyzed on a dedicated work station using MPR, MIP and VRT modes. The patient received 80 cc of contrast.  Aorta:  Normal size.  No calcifications.  No dissection.  Aortic Valve:  Trileaflet.  No calcifications.  Coronary Arteries:  Normal coronary origin.  Right dominance.  RCA is a large dominant artery that gives rise to PDA and PLVB. There is no plaque.  Left main is a large artery that gives rise to LAD and LCX arteries. There is no plaque.  LAD is a large vessel that gives rise to a large D1. There is moderate non calcified plaque in the proximal to mid D1 with associated stenosis of 50-69%. There is mild calcified plaque in the mid LAD with associated stenosis of 25-49%. There is moderate non calcified plaque in the mid to distal LAD with associated stenosis of 50-69%.  LCX is a non-dominant artery small artery that is poorly visualized but no obvious plaque present.  Other findings:  Normal pulmonary vein drainage into the left atrium.  Normal let atrial appendage without a thrombus.  Normal size of the pulmonary artery.  IMPRESSION: 1. Coronary calcium score of 8. This was 67th percentile for age and sex matched control.  2.  Normal coronary origin with right dominance.  3. Moderate atherosclerosis of the mid to distal LAD  and D1. CAD-RADS 3.  4. Consider symptom-guided anti-ischemic and preventive pharmacotherapy as well as risk factor modification per guideline-directed care.  5.  The study has been submitted for FFR analysis.  Paul [name] [name]   Electronically Signed By: Paul [name] [name] On: 08/19/2019 20:13  Narrative EXAM: OVER-READ INTERPRETATION  CT CHEST  The following report is an over-read performed by radiologist Dr. Trudie Reed of [facility] on 08/19/2019. This over-read does not include interpretation of cardiac or coronary anatomy or pathology. The coronary calcium score/coronary CTA interpretation by the cardiologist is attached.  COMPARISON:  None.  FINDINGS: Within the visualized portions of the thorax there are no suspicious appearing pulmonary nodules or masses, there is no acute consolidative airspace disease, no pleural effusions, no pneumothorax and no lymphadenopathy. Visualized portions of the upper abdomen are unremarkable. There are no aggressive appearing lytic or blastic lesions noted in the visualized portions of the skeleton.  IMPRESSION: No significant incidental noncardiac findings are noted.  Electronically Signed: By: Trudie Reed M.D. On: 08/19/2019 16:36           Lab Results  Component Value Date   WBC 7.4 09/29/2022   HGB 16.1 09/29/2022   HCT 45.4 09/29/2022   MCV 85.8 09/29/2022   PLT 220 09/29/2022   Lab Results  Component Value Date   CREATININE 0.95 09/29/2022   BUN 18 09/29/2022   NA 140 09/29/2022   K 4.0 09/29/2022   CL 104 09/29/2022   CO2 24 09/29/2022   Lab Results  Component Value Date   ALT 26 10/23/2022   AST 18 10/23/2022   ALKPHOS 86 10/23/2022   BILITOT 0.6 10/23/2022   Lab Results  Component Value Date   CHOL 120 10/23/2022   HDL 38 (L) 10/23/2022   LDLCALC 61 10/23/2022   TRIG 116  10/23/2022   CHOLHDL 3.2 10/23/2022    Lab Results  Component Value Date   HGBA1C 8.6 (A) 11/13/2022      Assessment & Plan    1.  Presyncope: -Patient was seen 09/29/2022 with complaint of presyncope and diaphoresis similar to presentation prior to having LHC performed with stent placed. -Today patient reports no recurrence of syncope or similar presentation as discussed in previous visit. -Previous event monitor ordered but was unable to be completed due to inability to stick to patient's chest. -We will reorder a ZIO monitor for 7 days.  2.  Coronary artery disease: -LHC completed with moderate atherosclerosis in the mid to distal LAD treated with DES/PCI x 1 -Today patient reports no chest discomfort or anginal equivalent.   -Continue GDMT withPlavix 75 mg, ASA 81 mg, Toprol-XL 12.5 mg Nitrostat 0.4 mg daily, Crestor daily   3.  DM type II: -Patient's last hemoglobin A1c was above goal at 8.7 -Continue Jardiance, and glycemic control medications per PCP and endocrinology.   4.  Hyperlipidemia: -Patient's last LDL cholesterol was controlled at 58 -Continue 10 mg daily    5. Sleep Apnea: -Patient reports compliance with CPAP machine  6.  Non-smoker: -Previous result notes stated that patient was a tobacco user and addendum's have been made stating that patient is not and has never been a tobacco user of any form.     Disposition: Follow-up with Paul [name] [name], Paul [name] or APP in 12 months   Medication Adjustments/Labs and Tests Ordered: Current medicines are reviewed at length with the patient today.  Concerns regarding medicines are outlined above.   Signed, Napoleon Form, Leodis Rains, NP 12/15/2022, 1:40 PM [facility]

## 2022-12-16 ENCOUNTER — Ambulatory Visit (INDEPENDENT_AMBULATORY_CARE_PROVIDER_SITE_OTHER)

## 2022-12-16 ENCOUNTER — Ambulatory Visit: Attending: Nurse Practitioner | Admitting: Nurse Practitioner

## 2022-12-16 ENCOUNTER — Encounter: Payer: Self-pay | Admitting: Nurse Practitioner

## 2022-12-16 VITALS — BP 120/80 | HR 79 | Ht 70.0 in | Wt 215.8 lb

## 2022-12-16 DIAGNOSIS — G4733 Obstructive sleep apnea (adult) (pediatric): Secondary | ICD-10-CM

## 2022-12-16 DIAGNOSIS — I2510 Atherosclerotic heart disease of native coronary artery without angina pectoris: Secondary | ICD-10-CM | POA: Diagnosis not present

## 2022-12-16 DIAGNOSIS — R55 Syncope and collapse: Secondary | ICD-10-CM

## 2022-12-16 DIAGNOSIS — E1165 Type 2 diabetes mellitus with hyperglycemia: Secondary | ICD-10-CM

## 2022-12-16 DIAGNOSIS — E1169 Type 2 diabetes mellitus with other specified complication: Secondary | ICD-10-CM

## 2022-12-16 DIAGNOSIS — Z789 Other specified health status: Secondary | ICD-10-CM

## 2022-12-16 DIAGNOSIS — E785 Hyperlipidemia, unspecified: Secondary | ICD-10-CM

## 2022-12-16 DIAGNOSIS — Z794 Long term (current) use of insulin: Secondary | ICD-10-CM

## 2022-12-16 NOTE — Progress Notes (Unsigned)
Enrolled patient for a 7 day Zio XT monitor to be mailed to patients home  Turner to read

## 2022-12-16 NOTE — Patient Instructions (Signed)
Medication Instructions:  Your physician recommends that you continue on your current medications as directed. Please refer to the Current Medication list given to you today. *If you need a refill on your cardiac medications before your next appointment, please call your pharmacy*   Lab Work: None ordered If you have labs (blood work) drawn today and your tests are completely normal, you will receive your results only by: [place] (if you have MyChart) OR A paper copy in the mail If you have any lab test that is abnormal or we need to change your treatment, we will call you to review the results.   Testing/Procedures: [place] [place]- Long Term Monitor Instructions  Your physician has requested you wear a ZIO patch monitor for 14 days.  This is a single patch monitor. [employer] supplies one patch monitor per enrollment. Additional stickers are not available. Please do not apply patch if you will be having a Nuclear Stress Test,  Echocardiogram, Cardiac CT, MRI, or Chest Xray during the period you would be wearing the  monitor. The patch cannot be worn during these tests. You cannot remove and re-apply the  ZIO XT patch monitor.  Your ZIO patch monitor will be mailed 3 day USPS to your address on file. It may take 3-5 days  to receive your monitor after you have been enrolled.  Once you have received your monitor, please review the enclosed instructions. Your monitor  has already been registered assigning a specific monitor serial # to you.  Billing and Patient Assistance Program Information  We have supplied [employer] with any of your insurance information on file for billing purposes. [employer] offers a sliding scale Patient Assistance Program for patients that do not have  insurance, or whose insurance does not completely cover the cost of the ZIO monitor.  You must apply for the Patient Assistance Program to qualify for this discounted rate.  To apply, please call [employer] at [phone],  select option 4, select option 2, ask to apply for  Patient Assistance Program. Theodore Demark will ask your household income, and how many people  are in your household. They will quote your out-of-pocket cost based on that information.  [employer] will also be able to set up a 106-month interest-free payment plan if needed.  Applying the monitor   Shave hair from upper left chest.  Hold abrader disc by orange tab. Rub abrader in 40 strokes over the upper left chest as  indicated in your monitor instructions.  Clean area with 4 enclosed alcohol pads. Let dry.  Apply patch as indicated in monitor instructions. Patch will be placed under collarbone on left  side of chest with arrow pointing upward.  Rub patch adhesive wings for 2 minutes. Remove white label marked "1". Remove the white  label marked "2". Rub patch adhesive wings for 2 additional minutes.  While looking in a mirror, press and release button in center of patch. A small green light will  flash 3-4 times. This will be your only indicator that the monitor has been turned on.  Do not shower for the first 24 hours. You may shower after the first 24 hours.  Press the button if you feel a symptom. You will hear a small click. Record Date, Time and  Symptom in the Patient Logbook.  When you are ready to remove the patch, follow instructions on the last 2 pages of Patient  Logbook. Stick patch monitor onto the last page of Patient Logbook.  Place Patient Logbook in  the blue and white box. Use locking tab on box and tape box closed  securely. The blue and white box has prepaid postage on it. Please place it in the mailbox as  soon as possible. Your physician should have your test results approximately 7 days after the  monitor has been mailed back to [facility].  Call [facility] Customer Care at [phone] if you have questions regarding  your ZIO XT patch monitor. Call them immediately if you see an orange light blinking on your   monitor.  If your monitor falls off in less than 4 days, contact our Monitor department at [phone].  If your monitor becomes loose or falls off after 4 days call [employer] at [phone] for  suggestions on securing your monitor   Follow-Up: At [facility], you and your health needs are our priority.  As part of our continuing mission to provide you with exceptional heart care, we have created designated Provider Care Teams.  These Care Teams include your primary Cardiologist (physician) and Advanced Practice Providers (APPs -  Physician Assistants and Nurse Practitioners) who all work together to provide you with the care you need, when you need it.  We recommend signing up for the patient portal called "MyChart".  Sign up information is provided on this After Visit Summary.  MyChart is used to connect with patients for Virtual Visits (Telemedicine).  Patients are able to view lab/test results, encounter notes, upcoming appointments, etc.  Non-urgent messages can be sent to your provider as well.   To learn more about what you can do with MyChart, go to [email or web address].    Your next appointment:   12 month(s)  Provider:   Armanda Magic, MD  or Robin Searing, NP   Other Instructions

## 2022-12-21 DIAGNOSIS — R55 Syncope and collapse: Secondary | ICD-10-CM | POA: Diagnosis not present

## 2023-01-12 ENCOUNTER — Other Ambulatory Visit: Payer: Self-pay

## 2023-01-12 DIAGNOSIS — I2510 Atherosclerotic heart disease of native coronary artery without angina pectoris: Secondary | ICD-10-CM

## 2023-01-12 MED ORDER — METOPROLOL SUCCINATE ER 25 MG PO TB24
25.0000 mg | ORAL_TABLET | Freq: Every day | ORAL | Status: DC
Start: 2023-01-12 — End: 2023-12-08

## 2023-02-02 ENCOUNTER — Encounter: Payer: Self-pay | Admitting: Internal Medicine

## 2023-02-03 ENCOUNTER — Other Ambulatory Visit: Payer: Self-pay | Admitting: Internal Medicine

## 2023-02-03 DIAGNOSIS — E1169 Type 2 diabetes mellitus with other specified complication: Secondary | ICD-10-CM

## 2023-02-03 DIAGNOSIS — I2510 Atherosclerotic heart disease of native coronary artery without angina pectoris: Secondary | ICD-10-CM

## 2023-02-03 MED ORDER — ROSUVASTATIN CALCIUM 10 MG PO TABS
ORAL_TABLET | ORAL | 1 refills | Status: DC
Start: 2023-02-03 — End: 2023-08-17

## 2023-02-06 ENCOUNTER — Ambulatory Visit: Admitting: Internal Medicine

## 2023-04-10 ENCOUNTER — Ambulatory Visit: Attending: Internal Medicine | Admitting: Internal Medicine

## 2023-04-10 ENCOUNTER — Encounter: Payer: Self-pay | Admitting: Internal Medicine

## 2023-04-10 VITALS — BP 116/75 | HR 66 | Temp 98.1°F | Ht 70.0 in | Wt 209.0 lb

## 2023-04-10 DIAGNOSIS — Z23 Encounter for immunization: Secondary | ICD-10-CM

## 2023-04-10 DIAGNOSIS — E1159 Type 2 diabetes mellitus with other circulatory complications: Secondary | ICD-10-CM | POA: Diagnosis not present

## 2023-04-10 DIAGNOSIS — Z794 Long term (current) use of insulin: Secondary | ICD-10-CM | POA: Diagnosis not present

## 2023-04-10 DIAGNOSIS — Z7985 Long-term (current) use of injectable non-insulin antidiabetic drugs: Secondary | ICD-10-CM | POA: Diagnosis not present

## 2023-04-10 DIAGNOSIS — R600 Localized edema: Secondary | ICD-10-CM

## 2023-04-10 DIAGNOSIS — Z7984 Long term (current) use of oral hypoglycemic drugs: Secondary | ICD-10-CM

## 2023-04-10 DIAGNOSIS — I2510 Atherosclerotic heart disease of native coronary artery without angina pectoris: Secondary | ICD-10-CM | POA: Diagnosis not present

## 2023-04-10 DIAGNOSIS — L299 Pruritus, unspecified: Secondary | ICD-10-CM | POA: Diagnosis not present

## 2023-04-10 DIAGNOSIS — E119 Type 2 diabetes mellitus without complications: Secondary | ICD-10-CM

## 2023-04-10 LAB — POCT GLYCOSYLATED HEMOGLOBIN (HGB A1C): HbA1c, POC (controlled diabetic range): 7.7 % — AB (ref 0.0–7.0)

## 2023-04-10 LAB — GLUCOSE, POCT (MANUAL RESULT ENTRY): POC Glucose: 71 mg/dL (ref 70–99)

## 2023-04-10 MED ORDER — FREESTYLE LIBRE SENSOR SYSTEM MISC
12 refills | Status: DC
Start: 2023-04-10 — End: 2023-09-11

## 2023-04-10 MED ORDER — TRIAMCINOLONE ACETONIDE 0.1 % EX CREA: 1.0000 | TOPICAL_CREAM | Freq: Two times a day (BID) | CUTANEOUS | 0 refills | Status: AC

## 2023-04-10 NOTE — Progress Notes (Signed)
[name], male    DOB: 01-Mar-1971  MRN: [id number]  CC: Diabetes (Dm f/u. /No questions concerns. Doy Mince & Flu vax administered on 04/10/23 - C.A)   Subjective: Paul [name] is a 52 y.o. male who presents for chronic ds management. His concerns today include:  Hx of DM, HL, OSA on CPAP, ED (Viagra through [place]), CAD (3 vessle CAD with 80% mid-LAD s/p DES).     Discussed the use of AI scribe software for clinical note transcription with the patient, who gave verbal consent to proceed.  History of Present Illness   The patient, with a history of type 2 diabetes and coronary artery disease, presents for a follow-up visit. He was last seen four months ago when his A1c was 8.8. At that time, he was on Lantus 40 units daily, Jardiance, and metformin twice a day. He was previously on Trulicity, but due to a Sport and exercise psychologist, he was switched to Ozempic by his endocrinologist. However, Ozempic was cost prohibitive, so he is considering switching back to Trulicity when his current supply of Ozempic runs out. He has been on Ozempic 0.5 mg for about a week or two, after a longer than intended period on the 0.25 dose. He reports tolerating the increased dose well, with the exception of an acid reflux-like feeling for the first day or two after each dose increase. He has not been taking anything for this symptom, but has been prescribed omeprazole to use as needed. He has not been checking his blood sugars regularly, but his A1c today is 7.7, down from 8.8 at his last visit. He reports no hypoglycemic episodes during the day, but sometimes feels shaky right before lunch.   He has been considering getting a continuous glucose monitor, but has been denied twice by his insurance. He is considering trying to get one through the [place].   He has not been exercising as much as he used to due to work and fatigue, but does walk for 15 minutes twice a day during work breaks.  CAD:  Reports  compliance with taking metoprolol 12.5 mg daily, aspirin, Plavix and Crestor 10 mg daily.  He wonders whether Metoprolol might be causing some swelling and itching in his lower legs. He has not been using furosemide, which was prescribed for swelling. He has not had any chest pain or shortness of breath, and has not needed to use nitroglycerin   Patient Active Problem List   Diagnosis Date Noted   Non-smoker 12/16/2022   Pre-syncope 12/16/2022   Dropped heart beats 02/27/2022   Diabetes mellitus (HCC) 12/03/2021   Dyslipidemia 12/03/2021   Type 2 diabetes mellitus with hyperglycemia, with long-term current use of insulin (HCC) 12/03/2021   Type 2 diabetes mellitus with retinopathy, with long-term current use of insulin (HCC) 12/03/2021   Acute rhinitis 07/24/2020   Fatty liver 11/24/2019   CAD (coronary artery disease) 09/08/2019   Erectile dysfunction 08/12/2016   Polyp of colon 08/12/2016   Obstructive sleep apnea 04/03/2015   Onychomycosis of right great toe 04/03/2015   Diabetes type 2, uncontrolled 01/25/2014   Dyslipidemia associated with type 2 diabetes mellitus (HCC) 01/25/2014   Skin tag 01/25/2014     Current Outpatient Medications on File Prior to Visit  Medication Sig Dispense Refill   aspirin EC 81 MG tablet Take 1 tablet (81 mg total) by mouth daily. 90 tablet 3   clopidogrel (PLAVIX) 75 MG tablet Take 1 tablet (75 mg total) by mouth  daily. 90 tablet 2   empagliflozin (JARDIANCE) 25 MG TABS tablet Take 1 tablet (25 mg total) by mouth daily before breakfast. 90 tablet 3   furosemide (LASIX) 20 MG tablet 1 tab PO daily PRN for swelling in legs 30 tablet 1   glipiZIDE (GLUCOTROL) 5 MG tablet Take 1 tablet (5 mg total) by mouth 2 (two) times daily before a meal. 180 tablet 3   Homeopathic Products (THERAWORX RELIEF EX) Apply 1 application topically daily as needed (muscle cramps / pains).     insulin glargine (LANTUS SOLOSTAR) 100 UNIT/ML Solostar Pen Inject 40 Units into  the skin daily. 45 mL 3   Insulin Pen Needle (B-D UF III MINI PEN NEEDLES) 31G X 5 MM MISC Inject 1 Device into the skin daily in the afternoon. 5 mm 31G 3/15.  Use as directed 100 each 3   metFORMIN (GLUCOPHAGE) 1000 MG tablet Take 1 tablet (1,000 mg total) by mouth 2 (two) times daily with a meal. 180 tablet 3   metoprolol succinate (TOPROL XL) 25 MG 24 hr tablet Take 1 tablet (25 mg total) by mouth daily.     nitroGLYCERIN (NITROSTAT) 0.4 MG SL tablet Place 1 tablet (0.4 mg total) under the tongue every 5 (five) minutes as needed. 25 tablet 2   omeprazole (PRILOSEC) 20 MG capsule Take 1 capsule (20 mg total) by mouth daily as needed. 30 capsule 3   rosuvastatin (CRESTOR) 10 MG tablet TAKE 1 TABLET(10 MG) BY MOUTH DAILY. 90 tablet 1   Semaglutide,0.25 or 0.5MG /DOS, 2 MG/3ML SOPN Inject 0.5 mg into the skin once a week. 9 mL 3   sildenafil (VIAGRA) 50 MG tablet Take 50 mg by mouth daily as needed for erectile dysfunction.     Turmeric 500 MG CAPS Take 1,000 mg by mouth once a week.     No current facility-administered medications on file prior to visit.    Allergies  Allergen Reactions   Codeine Nausea And Vomiting and Other (See Comments)    headache   Crestor [Rosuvastatin]     Elveated LFTs with Crestor 40mg  daily    Hydrocodone Nausea And Vomiting   Oxycodone Hcl Other (See Comments)    Headaches    Social History   Socioeconomic History   Marital status: Married    Spouse name: Not on file   Number of children: 2   Years of education: Not on file   Highest education level: Associate degree: occupational, Scientist, product/process development, or vocational program  Occupational History   Occupation: Engineer, materials  Tobacco Use   Smoking status: Never   Smokeless tobacco: Never  Vaping Use   Vaping status: Never Used  Substance and Sexual Activity   Alcohol use: Yes    Comment: Social   Drug use: No   Sexual activity: Yes  Other Topics Concern   Not on file  Social History Narrative   Not  on file   Social Determinants of Health   Financial Resource Strain: Low Risk  (12/01/2022)   Overall Financial Resource Strain (CARDIA)    Difficulty of Paying Living Expenses: Not very hard  Food Insecurity: No Food Insecurity (12/01/2022)   Hunger Vital Sign    Worried About Running Out of Food in the Last Year: Never true    Ran Out of Food in the Last Year: Never true  Transportation Needs: No Transportation Needs (12/01/2022)   PRAPARE - Administrator, Civil Service (Medical): No    Lack of Transportation (  Non-Medical): No  Physical Activity: Insufficiently Active (12/01/2022)   Exercise Vital Sign    Days of Exercise per Week: 4 days    Minutes of Exercise per Session: 20 min  Stress: No Stress Concern Present (12/01/2022)   [employer] of Occupational Health - Occupational Stress Questionnaire    Feeling of Stress : Not at all  Social Connections: Unknown (12/01/2022)   Social Connection and Isolation Panel [NHANES]    Frequency of Communication with Friends and Family: Once a week    Frequency of Social Gatherings with Friends and Family: Patient declined    Attends Religious Services: Patient declined    Database administrator or Organizations: No    Attends Engineer, structural: Not on file    Marital Status: Married  Catering manager Violence: Not on file    Family History  Problem Relation Age of Onset   Hypertension Father    Diabetes Mother    Sleep apnea Other    Heart attack Maternal Grandfather    Heart attack Paternal Grandfather    Cancer Neg Hx    Colon cancer Neg Hx    Esophageal cancer Neg Hx    Stomach cancer Neg Hx    Rectal cancer Neg Hx     Past Surgical History:  Procedure Laterality Date   COLONOSCOPY     CORONARY STENT INTERVENTION N/A 08/26/2019   Procedure: CORONARY STENT INTERVENTION;  Surgeon: Iran Ouch, MD;  Location: [facility];  Service: Cardiovascular;  Laterality: N/A;   KNEE ARTHROSCOPY Right     LEFT HEART CATH AND CORONARY ANGIOGRAPHY N/A 08/26/2019   Procedure: LEFT HEART CATH AND CORONARY ANGIOGRAPHY;  Surgeon: Iran Ouch, MD;  Location: [facility] INVAS[facility] CV [facility];  Service: Cardiovascular;  Laterality: N/A;   UMBILICAL HERNIA REPAIR      ROS: Review of Systems Negative except as stated above  PHYSICAL EXAM: BP 116/75 (BP Location: Left Arm, Patient Position: Sitting, Cuff Size: Large)   Pulse 66   Temp 98.1 F (36.7 C) (Oral)   Ht 5\' 10"  (1.778 m)   Wt 209 lb (94.8 kg)   SpO2 98%   BMI 29.99 kg/m   Physical Exam   General appearance - alert, well appearing, middle-age Caucasian male and in no distress Mental status - normal mood, behavior, speech, dress, motor activity, and thought processes Neck - supple, no significant adenopathy Chest - clear to auscultation, no wheezes, rales or rhonchi, symmetric air entry Heart - normal rate, regular rhythm, normal S1, S2, no murmurs, rubs, clicks or gallops Extremities - peripheral pulses normal, no pedal edema, no clubbing or cyanosis Diabetic Foot Exam - Simple   Simple Foot Form Diabetic Foot exam was performed with the following findings: Yes 04/10/2023  6:33 PM  Visual Inspection See comments: Yes Sensation Testing Intact to touch and monofilament testing bilaterally: Yes Pulse Check Posterior Tibialis and Dorsalis pulse intact bilaterally: Yes Comments Toenails on big toe discolored and thick.    Results for orders placed or performed in visit on 04/10/23  POCT glycosylated hemoglobin (Hb A1C)  Result Value Ref Range   Hemoglobin A1C     HbA1c POC (<> result, manual entry)     HbA1c, POC (prediabetic range)     HbA1c, POC (controlled diabetic range) 7.7 (A) 0.0 - 7.0 %  POCT glucose (manual entry)  Result Value Ref Range   POC Glucose 71 70 - 99 mg/dl       Latest Ref  Rng & Units 10/23/2022    7:20 AM 09/29/2022    6:04 AM 01/06/2022    9:37 AM  CMP  Glucose 70 - 99 mg/dL  161    BUN 6 - 20 mg/dL  18     Creatinine 0.96 - 1.24 mg/dL  0.45    Sodium 409 - 811 mmol/L  140    Potassium 3.5 - 5.1 mmol/L  4.0    Chloride 98 - 111 mmol/L  104    CO2 22 - 32 mmol/L  24    Calcium 8.9 - 10.3 mg/dL  9.3    Total Protein 6.0 - 8.5 g/dL 6.2  6.7    Total Bilirubin 0.0 - 1.2 mg/dL 0.6  0.8    Alkaline Phos 44 - 121 IU/L 86  74    AST 0 - 40 IU/L 18  25    ALT 0 - 44 IU/L 26  32  37    Lipid Panel     Component Value Date/Time   CHOL 120 10/23/2022 0720   TRIG 116 10/23/2022 0720   HDL 38 (L) 10/23/2022 0720   CHOLHDL 3.2 10/23/2022 0720   CHOLHDL 3 12/02/2021 1213   VLDL 21.8 12/02/2021 1213   LDLCALC 61 10/23/2022 0720    CBC    Component Value Date/Time   WBC 7.4 09/29/2022 0604   RBC 5.29 09/29/2022 0604   HGB 16.1 09/29/2022 0604   HGB 15.5 01/24/2021 1021   HCT 45.4 09/29/2022 0604   HCT 46.1 01/24/2021 1021   PLT 220 09/29/2022 0604   PLT 191 01/24/2021 1021   MCV 85.8 09/29/2022 0604   MCV 89 01/24/2021 1021   MCH 30.4 09/29/2022 0604   MCHC 35.5 09/29/2022 0604   RDW 12.3 09/29/2022 0604   RDW 12.5 01/24/2021 1021   LYMPHSABS 1.4 09/29/2022 0604   LYMPHSABS 2.0 01/24/2021 1021   MONOABS 0.7 09/29/2022 0604   EOSABS 0.4 09/29/2022 0604   EOSABS 0.5 (H) 01/24/2021 1021   BASOSABS 0.1 09/29/2022 0604   BASOSABS 0.1 01/24/2021 1021    ASSESSMENT AND PLAN:  1. Type 2 diabetes mellitus with other circulatory complication, with long-term current use of insulin (HCC) A1c has improved. He will continue current dose of Ozempic 0.5 mg.  He has been on this dose for only 2 weeks.  Continue metformin, Jardiance, glipizide, and Lantus insulin 40 units.  Encouraged him to check blood sugars regularly.  I will resubmit prescription for the continuous glucose monitor to see if we can get it approved.  - POCT glycosylated hemoglobin (Hb A1C) - POCT glucose (manual entry) - Continuous Glucose Sensor (FREESTYLE LIBRE SENSOR SYSTEM) MISC; Change sensor Q 2 wks  Dispense: 2 each;  Refill: 12 - Basic Metabolic Panel - CBC  2. Diabetes mellitus treated with oral medication (HCC) 3. Long-term (current) use of injectable non-insulin antidiabetic drugs See #1 above  4. Coronary artery disease involving native coronary artery of native heart without angina pectoris Stable.  Continue aspirin, Plavix, metoprolol and Crestor  5. Edema of both legs No edema of the legs today.  However I reminded patient that he does have furosemide to use as needed.  6. Itch of skin - triamcinolone cream (KENALOG) 0.1 %; Apply 1 Application topically 2 (two) times daily.  Dispense: 30 g; Refill: 0  7. Encounter for immunization - Flu vaccine trivalent PF, 6mos and older(Flulaval,Afluria,Fluarix,Fluzone)  8. Need for shingles vaccine Given today    Patient was given the opportunity to  ask questions.  Patient verbalized understanding of the plan and was able to repeat key elements of the plan.   This documentation was completed using Paediatric nurse.  Any transcriptional errors are unintentional.  Orders Placed This Encounter  Procedures   Varicella-zoster vaccine IM   Flu vaccine trivalent PF, 6mos and older(Flulaval,Afluria,Fluarix,Fluzone)   Basic Metabolic Panel   CBC   POCT glycosylated hemoglobin (Hb A1C)   POCT glucose (manual entry)     Requested Prescriptions   Signed Prescriptions Disp Refills   Continuous Glucose Sensor (FREESTYLE LIBRE SENSOR SYSTEM) MISC 2 each 12    Sig: Change sensor Q 2 wks   triamcinolone cream (KENALOG) 0.1 % 30 g 0    Sig: Apply 1 Application topically 2 (two) times daily.    Return in about 4 months (around 08/11/2023) for SIgn release to get records from [facility].  Jonah Blue, MD, FACP

## 2023-04-11 LAB — CBC
Hematocrit: 46.3 % (ref 37.5–51.0)
Hemoglobin: 15.8 g/dL (ref 13.0–17.7)
MCH: 30.9 pg (ref 26.6–33.0)
MCHC: 34.1 g/dL (ref 31.5–35.7)
MCV: 90 fL (ref 79–97)
Platelets: 187 10*3/uL (ref 150–450)
RBC: 5.12 x10E6/uL (ref 4.14–5.80)
RDW: 12.8 % (ref 11.6–15.4)
WBC: 6.7 10*3/uL (ref 3.4–10.8)

## 2023-04-11 LAB — BASIC METABOLIC PANEL
BUN/Creatinine Ratio: 24 — ABNORMAL HIGH (ref 9–20)
BUN: 19 mg/dL (ref 6–24)
CO2: 21 mmol/L (ref 20–29)
Calcium: 9.5 mg/dL (ref 8.7–10.2)
Chloride: 105 mmol/L (ref 96–106)
Creatinine, Ser: 0.8 mg/dL (ref 0.76–1.27)
Glucose: 81 mg/dL (ref 70–99)
Potassium: 4 mmol/L (ref 3.5–5.2)
Sodium: 144 mmol/L (ref 134–144)
eGFR: 106 mL/min/{1.73_m2} (ref >59)

## 2023-05-18 NOTE — Progress Notes (Unsigned)
Name: Paul [name]  MRN/ DOB: [id number], 1970-09-13   Age/ Sex: 52 y.o., male    PCP: Marcine Matar, MD   Reason for Endocrinology Evaluation: Type 2 Diabetes Mellitus     Date of Initial Endocrinology Visit: 12/02/2021    PATIENT IDENTIFIER: Paul [name] is a 52 y.o. male with a past medical history of DM, CAD, HTN and dyslipidemia, OSA on CPAP . The patient presented for initial endocrinology clinic visit on 12/02/2021  for consultative assistance with his diabetes management.    HPI: Paul [name] was    Diagnosed with DM 2019 Hemoglobin A1c has ranged from 8.3% in 2022, peaking at 9.9% in 2019.  On his initial visit to our clinic his A1c was 7.9%, he was on metformin, Trulicity, Jardiance, and Lantus. We increased Trulicity and Jardiance, continued metformin and basal insulin   Started glipizide 07/2022  Switch Trulicity to Ozempic 10/2022 due to shortage of supplies Switch back to Trulicity 05/2023 as the cost of Ozempic was high compared to Trulicity  SUBJECTIVE:   During the last visit (11/13/2022): A1c 8.7%    Today (05/19/23): Paul [name] is here for a follow up on diabetes management. He checks his blood sugars occasionally . The patient has  had hypoglycemic episodes since the last clinic visit, he is symptomatic with these episodes , mainly in the morning   Trulicity is cheaper  Denies nausea or vomiting  Denies constipation with diarrhea  Has mild rash on the shins over the past year and half    HOME DIABETES REGIMEN: Metformin 1000 mg BID Glipizide 5 mg twice daily Jardiance 25 mg daily  Ozempic 0.5 mg weekly Lantus 40 units daily    Statin: yes ACE-I/ARB: no    METER DOWNLOAD SUMMARY: did not bring     DIABETIC COMPLICATIONS: Microvascular complications:  Retinopathy (mild)  Denies: CKD, neuropathy  Last eye exam: Completed 10/2021  Macrovascular complications:  CAD (S/P PCI) Denies: PVD, CVA   PAST HISTORY: Past  Medical History:  Past Medical History:  Diagnosis Date   Diabetes mellitus without complication (HCC)    Fatty liver    abnormal CT 2014   Heart disease    Hyperlipidemia    Kidney stones    Non-smoker    Sleep apnea    wears CPAP   Past Surgical History:  Past Surgical History:  Procedure Laterality Date   COLONOSCOPY     CORONARY STENT INTERVENTION N/A 08/26/2019   Procedure: CORONARY STENT INTERVENTION;  Surgeon: Iran Ouch, MD;  Location: [facility];  Service: Cardiovascular;  Laterality: N/A;   KNEE ARTHROSCOPY Right    LEFT HEART CATH AND CORONARY ANGIOGRAPHY N/A 08/26/2019   Procedure: LEFT HEART CATH AND CORONARY ANGIOGRAPHY;  Surgeon: Iran Ouch, MD;  Location: [facility];  Service: Cardiovascular;  Laterality: N/A;   UMBILICAL HERNIA REPAIR      Social History:  reports that he has never smoked. He has never used smokeless tobacco. He reports current alcohol use. He reports that he does not use drugs. Family History:  Family History  Problem Relation Age of Onset   Hypertension Father    Diabetes Mother    Sleep apnea Other    Heart attack Maternal Grandfather    Heart attack Paternal Grandfather    Cancer Neg Hx    Colon cancer Neg Hx    Esophageal cancer Neg Hx    Stomach cancer Neg Hx    Rectal  cancer Neg Hx      HOME MEDICATIONS: Allergies as of 05/19/2023       Reactions   Codeine Nausea And Vomiting, Other (See Comments)   headache   Crestor [rosuvastatin]    Elveated LFTs with Crestor 40mg  daily   Hydrocodone Nausea And Vomiting   Oxycodone Hcl Other (See Comments)   Headaches        Medication List        Accurate as of May 19, 2023  7:38 AM. If you have any questions, ask your nurse or doctor.          aspirin EC 81 MG tablet Take 1 tablet (81 mg total) by mouth daily.   B-D UF III MINI PEN NEEDLES 31G X 5 MM Misc Generic drug: Insulin Pen Needle Inject 1 Device into the skin daily in the  afternoon. 5 mm 31G 3/15.  Use as directed   clopidogrel 75 MG tablet Commonly known as: PLAVIX Take 1 tablet (75 mg total) by mouth daily.   empagliflozin 25 MG Tabs tablet Commonly known as: JARDIANCE Take 1 tablet (25 mg total) by mouth daily before breakfast.   FreeStyle Libre Sensor System Misc Change sensor Q 2 wks   furosemide 20 MG tablet Commonly known as: LASIX 1 tab PO daily PRN for swelling in legs   glipiZIDE 5 MG tablet Commonly known as: GLUCOTROL Take 1 tablet (5 mg total) by mouth 2 (two) times daily before a meal.   Lantus SoloStar 100 UNIT/ML Solostar Pen Generic drug: insulin glargine Inject 40 Units into the skin daily.   metFORMIN 1000 MG tablet Commonly known as: GLUCOPHAGE Take 1 tablet (1,000 mg total) by mouth 2 (two) times daily with a meal.   metoprolol succinate 25 MG 24 hr tablet Commonly known as: Toprol XL Take 1 tablet (25 mg total) by mouth daily.   nitroGLYCERIN 0.4 MG SL tablet Commonly known as: Nitrostat Place 1 tablet (0.4 mg total) under the tongue every 5 (five) minutes as needed.   omeprazole 20 MG capsule Commonly known as: PRILOSEC Take 1 capsule (20 mg total) by mouth daily as needed.   rosuvastatin 10 MG tablet Commonly known as: CRESTOR TAKE 1 TABLET(10 MG) BY MOUTH DAILY.   Semaglutide(0.25 or 0.5MG /DOS) 2 MG/3ML Sopn Inject 0.5 mg into the skin once a week.   sildenafil 50 MG tablet Commonly known as: VIAGRA Take 50 mg by mouth daily as needed for erectile dysfunction.   THERAWORX RELIEF EX Apply 1 application topically daily as needed (muscle cramps / pains).   triamcinolone cream 0.1 % Commonly known as: KENALOG Apply 1 Application topically 2 (two) times daily.   Turmeric 500 MG Caps Take 1,000 mg by mouth once a week.         ALLERGIES: Allergies  Allergen Reactions   Codeine Nausea And Vomiting and Other (See Comments)    headache   Crestor [Rosuvastatin]     Elveated LFTs with Crestor 40mg   daily    Hydrocodone Nausea And Vomiting   Oxycodone Hcl Other (See Comments)    Headaches     REVIEW OF SYSTEMS: A comprehensive ROS was conducted with the patient and is negative except as per HPI    OBJECTIVE:   VITAL SIGNS: BP 120/84 (BP Location: Left Arm, Patient Position: Sitting, Cuff Size: Large)   Pulse 69   Ht 5\' 10"  (1.778 m)   Wt 208 lb (94.3 kg)   SpO2 99%   BMI 29.84 kg/m  PHYSICAL EXAM:  General: Pt appears well and is in NAD  Neck: General: Supple without adenopathy or carotid bruits. Thyroid: Thyroid size normal.  No goiter or nodules appreciated.   Lungs: Clear with good BS bilat   Heart: RRR   Abdomen:  soft, nontender  Extremities:  Lower extremities -No pretibial edema,   Neuro: MS is good with appropriate affect, pt is alert and Ox3    DM foot exam:05/19/2023  The skin of the feet is intact without sores or ulcerations, right great toe nail discoloration  The pedal pulses are 2+ on right and 2+ on left. The sensation is intact to a screening 5.07, 10 gram monofilament bilaterally   DATA REVIEWED:  Lab Results  Component Value Date   HGBA1C 7.7 (A) 04/10/2023   HGBA1C 8.6 (A) 11/13/2022   HGBA1C 8.7 (A) 08/21/2022    Latest Reference Range & Units 04/10/23 10:22  Sodium 134 - 144 mmol/L 144  Potassium 3.5 - 5.2 mmol/L 4.0  Chloride 96 - 106 mmol/L 105  CO2 20 - 29 mmol/L 21  Glucose 70 - 99 mg/dL 81  BUN 6 - 24 mg/dL 19  Creatinine 0.98 - 1.19 mg/dL 1.47  Calcium 8.7 - 82.9 mg/dL 9.5  BUN/Creatinine Ratio 9 - 20  24 (H)  eGFR >59 mL/min/1.73 106  (H): Data is abnormally high    ASSESSMENT / PLAN / RECOMMENDATIONS:   1) Type 2 Diabetes Mellitus, Sub-Optimally controlled, With macrovascular and retinopathic  complications - Most recent A1c of 7.7 %. Goal A1c < 7.0 %.    --[employer] has not covered Dexcom nor freestyle libre - Intolerant to [employer] 4.5 mg and 3 mg, we had switched to Ozempic due to shortage of supply,  but patient would like to go back to [employer] as it is most cost effective -Patient endorses hypoglycemia in the mornings, will decrease insulin as below -I will increase glipizide -Not a candidate for pioglitazone due to lower extremity edema -This morning he had a protein bar and juice, patient advised to avoid all sugar sweetened beverages   MEDICATIONS: Continue glipizide 5 mg, twice daily Decrease Lantus  40 units daily Continue Jardiance 25 mg daily Continue metformin 1000 mg twice daily Switch Trulicity to Ozempic 0.25 mg weekly for 6 weeks then increase to 0.5 mg weekly  EDUCATION / INSTRUCTIONS: BG monitoring instructions: Patient is instructed to check his blood sugars 3 times a day, before meals. Call [facility] if: BG persistently < 70  I reviewed the Rule of 15 for the treatment of hypoglycemia in detail with the patient. Literature supplied.   2) Diabetic complications:  Eye: Does  have known diabetic retinopathy.  Neuro/ Feet: Does not have known diabetic peripheral neuropathy. Renal: Patient does not have known baseline CKD. He is on an ACEI/ARB at present.  3) CAD/Dyslipidemia:   -Patient follows with cardiology   4) Skin Rash :  -Patient has noted papular rash on bilateral shins over the past year and a half, he questions if this may be allergic reaction to medication, it appears more of a local reaction rather than allergic -I have offered to refer him to dermatology, symptoms are mild and tolerable  F/U in 4 months   Signed electronically by: Lyndle Herrlich, MD  [facility] End[facility]  [facility] [address] [place] [place], [place] [address] Phone: [phone] FAX: [phone]   CC: Marcine Matar, MD [address] [place] [address]  Phone: [phone]  Fax: [phone]    Return to Endocrinology clinic as below: Future Appointments  Date Time Provider Department Center   08/14/2023  9:10 AM Marcine Matar, MD [id number] None

## 2023-05-19 ENCOUNTER — Encounter: Payer: Self-pay | Admitting: Internal Medicine

## 2023-05-19 ENCOUNTER — Ambulatory Visit: Admitting: Internal Medicine

## 2023-05-19 VITALS — BP 120/84 | HR 69 | Ht 70.0 in | Wt 208.0 lb

## 2023-05-19 DIAGNOSIS — Z794 Long term (current) use of insulin: Secondary | ICD-10-CM | POA: Diagnosis not present

## 2023-05-19 DIAGNOSIS — E1165 Type 2 diabetes mellitus with hyperglycemia: Secondary | ICD-10-CM

## 2023-05-19 DIAGNOSIS — R21 Rash and other nonspecific skin eruption: Secondary | ICD-10-CM

## 2023-05-19 DIAGNOSIS — E1159 Type 2 diabetes mellitus with other circulatory complications: Secondary | ICD-10-CM

## 2023-05-19 LAB — POCT GLUCOSE (DEVICE FOR HOME USE): POC Glucose: 187 mg/dL — AB (ref 70–99)

## 2023-05-19 MED ORDER — METFORMIN HCL 1000 MG PO TABS
1000.0000 mg | ORAL_TABLET | Freq: Two times a day (BID) | ORAL | 3 refills | Status: DC
Start: 2023-05-19 — End: 2024-04-12

## 2023-05-19 MED ORDER — TRULICITY 1.5 MG/0.5ML ~~LOC~~ SOAJ: 1.5000 mg | SUBCUTANEOUS | 3 refills | Status: DC

## 2023-05-19 MED ORDER — LANTUS SOLOSTAR 100 UNIT/ML ~~LOC~~ SOPN
32.0000 [IU] | PEN_INJECTOR | Freq: Every day | SUBCUTANEOUS | 4 refills | Status: DC
Start: 2023-05-19 — End: 2024-04-12

## 2023-05-19 MED ORDER — GLIPIZIDE 10 MG PO TABS: 10.0000 mg | ORAL_TABLET | Freq: Two times a day (BID) | ORAL | 3 refills | Status: DC

## 2023-05-19 MED ORDER — EMPAGLIFLOZIN 25 MG PO TABS
25.0000 mg | ORAL_TABLET | Freq: Every day | ORAL | 3 refills | Status: DC
Start: 2023-05-19 — End: 2024-04-22

## 2023-05-19 NOTE — Patient Instructions (Signed)
Switch Ozempic back to Trulicity 1.5 mg weekly Increase glipizide 10 mg, 1 tablet before Breakfast and 1 tablet before Supper  Decrease   Lantus 32 units daily  Continue Jardiance 25 mg daily  Continue Metformin 1000 mg twice daily     HOW TO TREAT LOW BLOOD SUGARS (Blood sugar LESS THAN 70 MG/DL) Please follow the RULE OF 15 for the treatment of hypoglycemia treatment (when your (blood sugars are less than 70 mg/dL)   STEP 1: Take 15 grams of carbohydrates when your blood sugar is low, which includes:  3-4 GLUCOSE TABS  OR 3-4 OZ OF JUICE OR REGULAR SODA OR ONE TUBE OF GLUCOSE GEL    STEP 2: RECHECK blood sugar in 15 MINUTES STEP 3: If your blood sugar is still low at the 15 minute recheck --> then, go back to STEP 1 and treat AGAIN with another 15 grams of carbohydrates.

## 2023-06-17 ENCOUNTER — Encounter (HOSPITAL_BASED_OUTPATIENT_CLINIC_OR_DEPARTMENT_OTHER): Payer: Self-pay | Admitting: Cardiology

## 2023-08-14 ENCOUNTER — Encounter: Payer: Self-pay | Admitting: Internal Medicine

## 2023-08-14 ENCOUNTER — Ambulatory Visit: Attending: Internal Medicine | Admitting: Internal Medicine

## 2023-08-14 VITALS — BP 116/72 | HR 71 | Temp 97.5°F | Ht 70.0 in | Wt 207.0 lb

## 2023-08-14 DIAGNOSIS — Z7984 Long term (current) use of oral hypoglycemic drugs: Secondary | ICD-10-CM | POA: Diagnosis not present

## 2023-08-14 DIAGNOSIS — E1159 Type 2 diabetes mellitus with other circulatory complications: Secondary | ICD-10-CM | POA: Diagnosis not present

## 2023-08-14 DIAGNOSIS — Z794 Long term (current) use of insulin: Secondary | ICD-10-CM | POA: Diagnosis not present

## 2023-08-14 DIAGNOSIS — E119 Type 2 diabetes mellitus without complications: Secondary | ICD-10-CM

## 2023-08-14 DIAGNOSIS — Z7985 Long-term (current) use of injectable non-insulin antidiabetic drugs: Secondary | ICD-10-CM | POA: Diagnosis not present

## 2023-08-14 DIAGNOSIS — H9313 Tinnitus, bilateral: Secondary | ICD-10-CM

## 2023-08-14 DIAGNOSIS — G629 Polyneuropathy, unspecified: Secondary | ICD-10-CM

## 2023-08-14 DIAGNOSIS — I2510 Atherosclerotic heart disease of native coronary artery without angina pectoris: Secondary | ICD-10-CM

## 2023-08-14 DIAGNOSIS — E559 Vitamin D deficiency, unspecified: Secondary | ICD-10-CM

## 2023-08-14 DIAGNOSIS — L089 Local infection of the skin and subcutaneous tissue, unspecified: Secondary | ICD-10-CM

## 2023-08-14 DIAGNOSIS — G5783 Other specified mononeuropathies of bilateral lower limbs: Secondary | ICD-10-CM

## 2023-08-14 LAB — POCT GLYCOSYLATED HEMOGLOBIN (HGB A1C): HbA1c, POC (controlled diabetic range): 8.5 % — AB (ref 0.0–7.0)

## 2023-08-14 LAB — GLUCOSE, POCT (MANUAL RESULT ENTRY): POC Glucose: 124 mg/dL — AB (ref 70–99)

## 2023-08-14 MED ORDER — GABAPENTIN 300 MG PO CAPS: 300.0000 mg | ORAL_CAPSULE | Freq: Every day | ORAL | 3 refills | Status: DC

## 2023-08-14 MED ORDER — SULFAMETHOXAZOLE-TRIMETHOPRIM 800-160 MG PO TABS
1.0000 | ORAL_TABLET | Freq: Two times a day (BID) | ORAL | 0 refills | Status: AC
Start: 2023-08-14 — End: ?

## 2023-08-14 NOTE — Patient Instructions (Signed)
I have sent prescription to your pharmacy for an antibiotic called Bactrim to take twice a day for 7 days. We will try her with gabapentin 300 mg to take at bedtime to see if it would help with the neuropathy symptoms in your legs.  Please work on improving your eating habits. Please remember to schedule an eye exam.  Have them send me a note after a you have had the eye exam done.

## 2023-08-14 NOTE — Progress Notes (Signed)
[name], male    DOB: [birth year]  MRN: [id number]  CC: Diabetes (DM f/u. Denton Meek about on-going intermittent ringing in ears /In-grown hair / cyst in groin /Already received flu vax)   Subjective: Paul [name] is a 53 y.o. male who presents for chronic ds management. His concerns today include:  Hx of DM, HL, OSA on CPAP, ED (Viagra through [place]), CAD (3 vessle CAD with 80% mid-LAD s/p DES).     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 There all the time and sometimes it interferes with his sleep.  Plans to go to [place] to address as part of his disability.  C/o ingrown hair or cyst in RT groin area x 1 wk; red and sore if he touches the area Using alcohol and peroxide on it.   DM: Results for orders placed or performed in visit on 08/14/23  POCT glucose (manual entry)   Collection Time: 08/14/23  9:18 AM  Result Value Ref Range   POC Glucose 124 (A) 70 - 99 mg/dl  POCT glycosylated hemoglobin (Hb A1C)   Collection Time: 08/14/23  9:25 AM  Result Value Ref Range   Hemoglobin A1C     HbA1c POC (<> result, manual entry)     HbA1c, POC (prediabetic range)     HbA1c, POC (controlled diabetic range) 8.5 (A) 0.0 - 7.0 %  A1C increased from last visit Saw endo Dr. Brooks Sailors in Nov 2024. Lantus dec to 40 units daily and continued with Trulicity 1.5 mg Q wk, Metformin 1 gram BID, Glucotrol 10 mg BID, Jardiance 25 mg daily.  Reports compliance with taking medications. Not checking BS regularly; insurance has not approved CGM. Eating habits not too bad but not the best over the holidays Larey Seat off with exercising for past several mths but started back 2 wks ago -over due for eye exam  CAD:  Reports compliance with taking metoprolol 12.5 mg daily, aspirin, Plavix 75 mg and Crestor 10 mg daily.  No CP/SOB/LE edema  Still gets itching in the legs sometimes.  He is  also noticed intermittent numbness/weird feeling in the lower legs from the knees to the ankles  Patient Active Problem List   Diagnosis Date Noted   Non-smoker 12/16/2022   Pre-syncope 12/16/2022   Dropped heart beats 02/27/2022   Diabetes mellitus (HCC) 12/03/2021   Dyslipidemia 12/03/2021   Type 2 diabetes mellitus with hyperglycemia, with long-term current use of insulin (HCC) 12/03/2021   Type 2 diabetes mellitus with retinopathy, with long-term current use of insulin (HCC) 12/03/2021   Acute rhinitis 07/24/2020   Fatty liver 11/24/2019   CAD (coronary artery disease) 09/08/2019   Erectile dysfunction 08/12/2016   Polyp of colon 08/12/2016   Obstructive sleep apnea 04/03/2015   Onychomycosis of right great toe 04/03/2015   Diabetes type 2, uncontrolled 01/25/2014   Dyslipidemia associated with type 2 diabetes mellitus (HCC) 01/25/2014   Skin tag 01/25/2014     Current Outpatient Medications on File Prior to Visit  Medication Sig Dispense Refill   aspirin EC 81 MG tablet Take 1 tablet (81 mg total) by mouth daily. 90 tablet 3   clopidogrel (PLAVIX) 75 MG tablet Take 1 tablet (75 mg total) by mouth daily. 90 tablet 2   Continuous Glucose Sensor (FREESTYLE LIBRE SENSOR SYSTEM) MISC Change sensor Q 2 wks 2 each 12   Dulaglutide (TRULICITY) 1.5 MG/0.5ML  SOAJ Inject 1.5 mg into the skin once a week. 6 mL 3   empagliflozin (JARDIANCE) 25 MG TABS tablet Take 1 tablet (25 mg total) by mouth daily before breakfast. 90 tablet 3   furosemide (LASIX) 20 MG tablet 1 tab PO daily PRN for swelling in legs 30 tablet 1   glipiZIDE (GLUCOTROL) 10 MG tablet Take 1 tablet (10 mg total) by mouth 2 (two) times daily before a meal. 180 tablet 3   Homeopathic Products (THERAWORX RELIEF EX) Apply 1 application topically daily as needed (muscle cramps / pains).     insulin glargine (LANTUS SOLOSTAR) 100 UNIT/ML Solostar Pen Inject 32 Units into the skin daily. 30 mL 4   Insulin Pen Needle (B-D UF III  MINI PEN NEEDLES) 31G X 5 MM MISC Inject 1 Device into the skin daily in the afternoon. 5 mm 31G 3/15.  Use as directed 100 each 3   metFORMIN (GLUCOPHAGE) 1000 MG tablet Take 1 tablet (1,000 mg total) by mouth 2 (two) times daily with a meal. 180 tablet 3   metoprolol succinate (TOPROL XL) 25 MG 24 hr tablet Take 1 tablet (25 mg total) by mouth daily.     nitroGLYCERIN (NITROSTAT) 0.4 MG SL tablet Place 1 tablet (0.4 mg total) under the tongue every 5 (five) minutes as needed. 25 tablet 2   omeprazole (PRILOSEC) 20 MG capsule Take 1 capsule (20 mg total) by mouth daily as needed. 30 capsule 3   rosuvastatin (CRESTOR) 10 MG tablet TAKE 1 TABLET(10 MG) BY MOUTH DAILY. 90 tablet 1   sildenafil (VIAGRA) 50 MG tablet Take 50 mg by mouth daily as needed for erectile dysfunction. (Patient not taking: Reported on 08/14/2023)     triamcinolone cream (KENALOG) 0.1 % Apply 1 Application topically 2 (two) times daily. (Patient not taking: Reported on 08/14/2023) 30 g 0   Turmeric 500 MG CAPS Take 1,000 mg by mouth once a week. (Patient not taking: Reported on 08/14/2023)     No current facility-administered medications on file prior to visit.    Allergies  Allergen Reactions   Codeine Nausea And Vomiting and Other (See Comments)    headache   Crestor [Rosuvastatin]     Elveated LFTs with Crestor 40mg  daily    Hydrocodone Nausea And Vomiting   Oxycodone Hcl Other (See Comments)    Headaches    Social History   Socioeconomic History   Marital status: Married    Spouse name: Not on file   Number of children: 2   Years of education: Not on file   Highest education level: Associate degree: occupational, Scientist, product/process development, or vocational program  Occupational History   Occupation: Engineer, materials  Tobacco Use   Smoking status: Never   Smokeless tobacco: Never  Vaping Use   Vaping status: Never Used  Substance and Sexual Activity   Alcohol use: Yes    Comment: Social   Drug use: No   Sexual activity:  Yes  Other Topics Concern   Not on file  Social History Narrative   Not on file   Social Drivers of Health   Financial Resource Strain: Low Risk  (08/12/2023)   Overall Financial Resource Strain (CARDIA)    Difficulty of Paying Living Expenses: Not hard at all  Food Insecurity: No Food Insecurity (08/12/2023)   Hunger Vital Sign    Worried About Running Out of Food in the Last Year: Never true    Ran Out of Food in the Last Year: Never true  Transportation Needs: No Transportation Needs (08/12/2023)   PRAPARE - Administrator, Civil Service (Medical): No    Lack of Transportation (Non-Medical): No  Physical Activity: Sufficiently Active (08/12/2023)   Exercise Vital Sign    Days of Exercise per Week: 3 days    Minutes of Exercise per Session: 60 min  Stress: No Stress Concern Present (08/12/2023)   [employer] of Occupational Health - Occupational Stress Questionnaire    Feeling of Stress : Not at all  Social Connections: Unknown (08/12/2023)   Social Connection and Isolation Panel [NHANES]    Frequency of Communication with Friends and Family: Patient declined    Frequency of Social Gatherings with Friends and Family: Patient declined    Attends Religious Services: Patient declined    Database administrator or Organizations: Patient declined    Attends Engineer, structural: Not on file    Marital Status: Married  Catering manager Violence: Not on file    Family History  Problem Relation Age of Onset   Hypertension Father    Diabetes Mother    Sleep apnea Other    Heart attack Maternal Grandfather    Heart attack Paternal Grandfather    Cancer Neg Hx    Colon cancer Neg Hx    Esophageal cancer Neg Hx    Stomach cancer Neg Hx    Rectal cancer Neg Hx     Past Surgical History:  Procedure Laterality Date   COLONOSCOPY     CORONARY STENT INTERVENTION N/A 08/26/2019   Procedure: CORONARY STENT INTERVENTION;  Surgeon: Iran Ouch, MD;   Location: [facility];  Service: Cardiovascular;  Laterality: N/A;   KNEE ARTHROSCOPY Right    LEFT HEART CATH AND CORONARY ANGIOGRAPHY N/A 08/26/2019   Procedure: LEFT HEART CATH AND CORONARY ANGIOGRAPHY;  Surgeon: Iran Ouch, MD;  Location: [facility];  Service: Cardiovascular;  Laterality: N/A;   UMBILICAL HERNIA REPAIR      ROS: Review of Systems Negative except as stated above  PHYSICAL EXAM: BP 116/72 (BP Location: Left Arm, Patient Position: Sitting, Cuff Size: Normal)   Pulse 71   Temp (!) 97.5 F (36.4 C) (Oral)   Ht 5\' 10"  (1.778 m)   Wt 207 lb (93.9 kg)   SpO2 98%   BMI 29.70 kg/m   Physical Exam   General appearance - alert, well appearing, and in no distress Mental status - normal mood, behavior, speech, dress, motor activity, and thought processes Neck - supple, no significant adenopathy Chest - clear to auscultation, no wheezes, rales or rhonchi, symmetric air entry Heart - normal rate, regular rhythm, normal S1, S2, no murmurs, rubs, clicks or gallops Neurological -gross sensation intact in both lower extremities. Extremities - peripheral pulses normal, no pedal edema, no clubbing or cyanosis Skin -patient has about a 1 to 1-1/2 cm soft raised erythematous area in RT pubic area.  No drainage appreciated     Latest Ref Rng & Units 04/10/2023   10:22 AM 10/23/2022    7:20 AM 09/29/2022    6:04 AM  CMP  Glucose 70 - 99 mg/dL 81   324   BUN 6 - 24 mg/dL 19   18   Creatinine 4.01 - 1.27 mg/dL 0.27   2.53   Sodium 664 - 144 mmol/L 144   140   Potassium 3.5 - 5.2 mmol/L 4.0   4.0   Chloride 96 - 106 mmol/L 105   104  CO2 20 - 29 mmol/L 21   24   Calcium 8.7 - 10.2 mg/dL 9.5   9.3   Total Protein 6.0 - 8.5 g/dL  6.2  6.7   Total Bilirubin 0.0 - 1.2 mg/dL  0.6  0.8   Alkaline Phos 44 - 121 IU/L  86  74   AST 0 - 40 IU/L  18  25   ALT 0 - 44 IU/L  26  32    Lipid Panel     Component Value Date/Time   CHOL 120 10/23/2022 0720   TRIG 116  10/23/2022 0720   HDL 38 (L) 10/23/2022 0720   CHOLHDL 3.2 10/23/2022 0720   CHOLHDL 3 12/02/2021 1213   VLDL 21.8 12/02/2021 1213   LDLCALC 61 10/23/2022 0720    CBC    Component Value Date/Time   WBC 6.7 04/10/2023 1022   WBC 7.4 09/29/2022 0604   RBC 5.12 04/10/2023 1022   RBC 5.29 09/29/2022 0604   HGB 15.8 04/10/2023 1022   HCT 46.3 04/10/2023 1022   PLT 187 04/10/2023 1022   MCV 90 04/10/2023 1022   MCH 30.9 04/10/2023 1022   MCH 30.4 09/29/2022 0604   MCHC 34.1 04/10/2023 1022   MCHC 35.5 09/29/2022 0604   RDW 12.8 04/10/2023 1022   LYMPHSABS 1.4 09/29/2022 0604   LYMPHSABS 2.0 01/24/2021 1021   MONOABS 0.7 09/29/2022 0604   EOSABS 0.4 09/29/2022 0604   EOSABS 0.5 (H) 01/24/2021 1021   BASOSABS 0.1 09/29/2022 0604   BASOSABS 0.1 01/24/2021 1021    ASSESSMENT AND PLAN: 1. Type 2 diabetes mellitus with other circulatory complication, with long-term current use of insulin (HCC) (Primary) Not at goal.  Attributes this to dietary indiscretions and not exercising routinely as he had been in the past.  Just started exercising again.  Plans to make changes in his eating habits. -Does not have any blood sugar readings with him today to review.  Encouraged him to check blood sugars at least once a day and record readings.  He has follow-up with his endocrinologist next month.  Should bring blood sugar readings with him to that visit. Continue Lantus dec to 40 units daily,Trulicity 1.5 mg Q wk, Metformin 1 gram BID, Glucotrol 10 mg BID, Jardiance 25 mg daily.  Reports compliance with taking medications. Not chec - POCT glycosylated hemoglobin (Hb A1C) - POCT glucose (manual entry)  2. Insulin long-term use (HCC) 3. Diabetes mellitus treated with oral medication (HCC) 4. Long-term (current) use of injectable non-insulin antidiabetic drugs See #1 above  5. Coronary artery disease involving native coronary artery of native heart without angina pectoris Stable.  Continue  Plavix, rosuvastatin, aspirin  6. Infected cyst of skin Antibiotics given.  Let me know if it does not resolve - sulfamethoxazole-trimethoprim (BACTRIM DS) 800-160 MG tablet; Take 1 tablet by mouth 2 (two) times daily.  Dispense: 14 tablet; Refill: 0  7. Neuropathy Patient with neuropathy symptoms in the lower legs between the knees and ankles.  Most likely diabetic neuropathy.  Will check B12 level since he has been on metformin for quite some time which can cause B12 deficiency.  Agreed to trying metformin to take at bedtime.  Advised the medicine can cause drowsiness. - Vitamin B12 - gabapentin (NEURONTIN) 300 MG capsule; Take 1 capsule (300 mg total) by mouth at bedtime.  Dispense: 30 capsule; Refill: 3  8. Tinnitus, bilateral Pt plans to get in with E[facility] at the [facility]   9. Vitamin D deficiency -  VITAMIN D 25 Hydroxy (Vit-D Deficiency, Fractures)              Patient was given the opportunity to ask questions.  Patient verbalized understanding of the plan and was able to repeat key elements of the plan.   This documentation was completed using Paediatric nurse.  Any transcriptional errors are unintentional.  Orders Placed This Encounter  Procedures   Vitamin B12   VITAMIN D 25 Hydroxy (Vit-D Deficiency, Fractures)   POCT glycosylated hemoglobin (Hb A1C)   POCT glucose (manual entry)     Requested Prescriptions   Signed Prescriptions Disp Refills   sulfamethoxazole-trimethoprim (BACTRIM DS) 800-160 MG tablet 14 tablet 0    Sig: Take 1 tablet by mouth 2 (two) times daily.   gabapentin (NEURONTIN) 300 MG capsule 30 capsule 3    Sig: Take 1 capsule (300 mg total) by mouth at bedtime.    Return in about 4 months (around 12/12/2023).  Jonah Blue, MD, FACP

## 2023-08-15 ENCOUNTER — Encounter: Payer: Self-pay | Admitting: Internal Medicine

## 2023-08-15 LAB — VITAMIN B12: Vitamin B-12: 510 pg/mL (ref 232–1245)

## 2023-08-15 LAB — VITAMIN D 25 HYDROXY (VIT D DEFICIENCY, FRACTURES): Vit D, 25-Hydroxy: 18.7 ng/mL — ABNORMAL LOW (ref 30.0–100.0)

## 2023-08-17 ENCOUNTER — Other Ambulatory Visit: Payer: Self-pay | Admitting: Internal Medicine

## 2023-08-17 DIAGNOSIS — I2510 Atherosclerotic heart disease of native coronary artery without angina pectoris: Secondary | ICD-10-CM

## 2023-08-17 DIAGNOSIS — E1169 Type 2 diabetes mellitus with other specified complication: Secondary | ICD-10-CM

## 2023-09-02 ENCOUNTER — Other Ambulatory Visit: Payer: Self-pay | Admitting: Internal Medicine

## 2023-09-02 DIAGNOSIS — I2510 Atherosclerotic heart disease of native coronary artery without angina pectoris: Secondary | ICD-10-CM

## 2023-09-03 NOTE — Telephone Encounter (Signed)
 Requested Prescriptions  Pending Prescriptions Disp Refills   clopidogrel (PLAVIX) 75 MG tablet [Pharmacy Med Name: CLOPIDOGREL BISULFATE TABS 75MG ] 90 tablet 1    Sig: TAKE 1 TABLET DAILY     Hematology: Antiplatelets - clopidogrel Passed - 09/03/2023 12:48 PM      Passed - HCT in normal range and within 180 days    Hematocrit  Date Value Ref Range Status  04/10/2023 46.3 37.5 - 51.0 % Final         Passed - HGB in normal range and within 180 days    Hemoglobin  Date Value Ref Range Status  04/10/2023 15.8 13.0 - 17.7 g/dL Final         Passed - PLT in normal range and within 180 days    Platelets  Date Value Ref Range Status  04/10/2023 187 150 - 450 x10E3/uL Final         Passed - Cr in normal range and within 360 days    Creat  Date Value Ref Range Status  08/12/2016 0.84 0.60 - 1.35 mg/dL Final   Creatinine, Ser  Date Value Ref Range Status  04/10/2023 0.80 0.76 - 1.27 mg/dL Final   Creatinine,U  Date Value Ref Range Status  12/02/2021 80.7 mg/dL Final   Creatinine, Urine  Date Value Ref Range Status  08/12/2016 61 20 - 370 mg/dL Final         Passed - Valid encounter within last 6 months    Recent Outpatient Visits           2 weeks ago Type 2 diabetes mellitus with other circulatory complication, with long-term current use of insulin (HCC)   [facility] Of [place]. [facility] Jonah Blue B, MD   4 months ago Type 2 diabetes mellitus with other circulatory complication, with long-term current use of insulin (HCC)   [facility] [place]. [facility] Jonah Blue B, MD   9 months ago Type 2 diabetes mellitus with other circulatory complication, with long-term current use of insulin (HCC)   [facility] Of [place]. [facility] Marcine Matar, MD   1 year ago Bell's palsy    [facility] Of [place]. [facility] Marcine Matar, MD   1 year ago Nausea and vomiting, unspecified vomiting type   [facility], [place]andra Knud[place], [place]       Future Appointments             In 3 months Laural Benes, Binnie Rail, MD [facility] - [facility] Dept Of [place]. [facility]

## 2023-09-09 ENCOUNTER — Encounter: Payer: Self-pay | Admitting: Internal Medicine

## 2023-09-10 ENCOUNTER — Other Ambulatory Visit: Payer: Self-pay | Admitting: Internal Medicine

## 2023-09-10 DIAGNOSIS — I2510 Atherosclerotic heart disease of native coronary artery without angina pectoris: Secondary | ICD-10-CM

## 2023-09-10 MED ORDER — CLOPIDOGREL BISULFATE 75 MG PO TABS: 75.0000 mg | ORAL_TABLET | Freq: Every day | ORAL | 0 refills | Status: AC

## 2023-09-10 MED ORDER — CLOPIDOGREL BISULFATE 75 MG PO TABS: 75.0000 mg | ORAL_TABLET | Freq: Every day | ORAL | 1 refills | Status: DC

## 2023-09-11 ENCOUNTER — Ambulatory Visit (INDEPENDENT_AMBULATORY_CARE_PROVIDER_SITE_OTHER): Admitting: Internal Medicine

## 2023-09-11 VITALS — BP 116/80 | HR 69 | Ht 70.0 in | Wt 210.6 lb

## 2023-09-11 DIAGNOSIS — E1165 Type 2 diabetes mellitus with hyperglycemia: Secondary | ICD-10-CM | POA: Diagnosis not present

## 2023-09-11 DIAGNOSIS — E1159 Type 2 diabetes mellitus with other circulatory complications: Secondary | ICD-10-CM

## 2023-09-11 DIAGNOSIS — Z794 Long term (current) use of insulin: Secondary | ICD-10-CM

## 2023-09-11 LAB — POCT GLYCOSYLATED HEMOGLOBIN (HGB A1C): Hemoglobin A1C: 8 % — AB (ref 4.0–5.6)

## 2023-09-11 MED ORDER — DEXCOM G7 SENSOR MISC: 1.0000 | 3 refills | Status: DC

## 2023-09-11 NOTE — Addendum Note (Signed)
 Addended by: Tera Partridge on: 09/11/2023 09:23 AM   Modules accepted: Orders

## 2023-09-11 NOTE — Patient Instructions (Addendum)
 Continue Trulicity 1.5 mg weekly Increase glipizide 10 mg, 2 tablets before Breakfast and 1 tablet before Supper  Decrease  Lantus 32 units daily  Continue Jardiance 25 mg daily  Continue Metformin 1000 mg twice daily     HOW TO TREAT LOW BLOOD SUGARS (Blood sugar LESS THAN 70 MG/DL) Please follow the RULE OF 15 for the treatment of hypoglycemia treatment (when your (blood sugars are less than 70 mg/dL)   STEP 1: Take 15 grams of carbohydrates when your blood sugar is low, which includes:  3-4 GLUCOSE TABS  OR 3-4 OZ OF JUICE OR REGULAR SODA OR ONE TUBE OF GLUCOSE GEL    STEP 2: RECHECK blood sugar in 15 MINUTES STEP 3: If your blood sugar is still low at the 15 minute recheck --> then, go back to STEP 1 and treat AGAIN with another 15 grams of carbohydrates.

## 2023-09-11 NOTE — Progress Notes (Signed)
 Name: Paul [name]  MRN/ DOB: [id number], 11-30-1970   Age/ Sex: 53 y.o., male    PCP: Marcine Matar, MD   Reason for Endocrinology Evaluation: Type 2 Diabetes Mellitus     Date of Initial Endocrinology Visit: 12/02/2021    PATIENT IDENTIFIER: [name] is a 53 y.o. male with a past medical history of DM, CAD, HTN and dyslipidemia, OSA on CPAP . The patient presented for initial endocrinology clinic visit on 12/02/2021  for consultative assistance with his diabetes management.    HPI: Paul [name] was    Diagnosed with DM 2019 Hemoglobin A1c has ranged from 8.3% in 2022, peaking at 9.9% in 2019.  On his initial visit to our clinic his A1c was 7.9%, he was on metformin, Trulicity, Jardiance, and Lantus. We increased Trulicity and Jardiance, continued metformin and basal insulin   Started glipizide 07/2022  Switch Trulicity to Ozempic 10/2022 due to shortage of supplies Switch back to Trulicity 05/2023 as the cost of Ozempic was high compared to Trulicity  SUBJECTIVE:   During the last visit (05/19/2023): A1c 7.7%    Today (09/11/23): Paul [name] is here for a follow up on diabetes management. He checks his blood sugars occasionally .He endorses hypoglycemia in the morning , he is symptomatic with these episodes.    Denies nausea or vomiting  Denies constipation with diarrhea   HOME DIABETES REGIMEN: Metformin 1000 mg BID Glipizide 10 mg twice daily Jardiance 25 mg daily  Trulicity 1.5 mg weekly Lantus 32 units daily - takes 40 units    Statin: yes ACE-I/ARB: no    METER DOWNLOAD SUMMARY: did not bring     DIABETIC COMPLICATIONS: Microvascular complications:  Retinopathy (mild)  Denies: CKD, neuropathy  Last eye exam: Completed 10/2021  Macrovascular complications:  CAD (S/P PCI) Denies: PVD, CVA   PAST HISTORY: Past Medical History:  Past Medical History:  Diagnosis Date   Diabetes mellitus without complication (HCC)    Fatty  liver    abnormal CT 2014   Heart disease    Hyperlipidemia    Kidney stones    Non-smoker    Sleep apnea    wears CPAP   Past Surgical History:  Past Surgical History:  Procedure Laterality Date   COLONOSCOPY     CORONARY STENT INTERVENTION N/A 08/26/2019   Procedure: CORONARY STENT INTERVENTION;  Surgeon: Iran Ouch, MD;  [facility];  Service: Cardiovascular;  Laterality: N/A;   KNEE ARTHROSCOPY Right    LEFT HEART CATH AND CORONARY ANGIOGRAPHY N/A 08/26/2019   Procedure: LEFT HEART CATH AND CORONARY ANGIOGRAPHY;  Surgeon: Iran Ouch, MD;  [facility]: [facility];  Service: Cardiovascular;  Laterality: N/A;   UMBILICAL HERNIA REPAIR      Social History:  reports that he has never smoked. He has never used smokeless tobacco. He reports current alcohol use. He reports that he does not use drugs. Family History:  Family History  Problem Relation Age of Onset   Hypertension Father    Diabetes Mother    Sleep apnea Other    Heart attack Maternal Grandfather    Heart attack Paternal Grandfather    Cancer Neg Hx    Colon cancer Neg Hx    Esophageal cancer Neg Hx    Stomach cancer Neg Hx    Rectal cancer Neg Hx      HOME MEDICATIONS: Allergies as of 09/11/2023       Reactions   Codeine  Nausea And Vomiting, Other (See Comments)   headache   Crestor [rosuvastatin]    Elveated LFTs with Crestor 40mg  daily   Hydrocodone Nausea And Vomiting   Oxycodone Hcl Other (See Comments)   Headaches        Medication List        Accurate as of September 11, 2023  8:01 AM. If you have any questions, ask your nurse or doctor.          aspirin EC 81 MG tablet Take 1 tablet (81 mg total) by mouth daily.   B-D UF III MINI PEN NEEDLES 31G X 5 MM Misc Generic drug: Insulin Pen Needle Inject 1 Device into the skin daily in the afternoon. 5 mm 31G 3/15.  Use as directed   clopidogrel 75 MG tablet Commonly known as: PLAVIX Take 1 tablet (75 mg  total) by mouth daily.   clopidogrel 75 MG tablet Commonly known as: PLAVIX Take 1 tablet (75 mg total) by mouth daily.   empagliflozin 25 MG Tabs tablet Commonly known as: JARDIANCE Take 1 tablet (25 mg total) by mouth daily before breakfast.   FreeStyle Libre Sensor System Misc Change sensor Q 2 wks   furosemide 20 MG tablet Commonly known as: LASIX 1 tab PO daily PRN for swelling in legs   gabapentin 300 MG capsule Commonly known as: NEURONTIN Take 1 capsule (300 mg total) by mouth at bedtime.   glipiZIDE 10 MG tablet Commonly known as: GLUCOTROL Take 1 tablet (10 mg total) by mouth 2 (two) times daily before a meal.   Lantus SoloStar 100 UNIT/ML Solostar Pen Generic drug: insulin glargine Inject 32 Units into the skin daily.   metFORMIN 1000 MG tablet Commonly known as: GLUCOPHAGE Take 1 tablet (1,000 mg total) by mouth 2 (two) times daily with a meal.   metoprolol succinate 25 MG 24 hr tablet Commonly known as: Toprol XL Take 1 tablet (25 mg total) by mouth daily.   nitroGLYCERIN 0.4 MG SL tablet Commonly known as: Nitrostat Place 1 tablet (0.4 mg total) under the tongue every 5 (five) minutes as needed.   omeprazole 20 MG capsule Commonly known as: PRILOSEC Take 1 capsule (20 mg total) by mouth daily as needed.   rosuvastatin 10 MG tablet Commonly known as: CRESTOR TAKE 1 TABLET(10 MG) BY MOUTH DAILY   sildenafil 50 MG tablet Commonly known as: VIAGRA Take 50 mg by mouth daily as needed for erectile dysfunction.   sulfamethoxazole-trimethoprim 800-160 MG tablet Commonly known as: BACTRIM DS Take 1 tablet by mouth 2 (two) times daily.   THERAWORX RELIEF EX Apply 1 application topically daily as needed (muscle cramps / pains).   triamcinolone cream 0.1 % Commonly known as: KENALOG Apply 1 Application topically 2 (two) times daily.   Trulicity 1.5 MG/0.5ML Soaj Generic drug: Dulaglutide Inject 1.5 mg into the skin once a week.   Turmeric 500 MG  Caps Take 1,000 mg by mouth once a week.         ALLERGIES: Allergies  Allergen Reactions   Codeine Nausea And Vomiting and Other (See Comments)    headache   Crestor [Rosuvastatin]     Elveated LFTs with Crestor 40mg  daily    Hydrocodone Nausea And Vomiting   Oxycodone Hcl Other (See Comments)    Headaches     REVIEW OF SYSTEMS: A comprehensive ROS was conducted with the patient and is negative except as per HPI    OBJECTIVE:   VITAL SIGNS: Wt 210 lb  9.6 oz (95.5 kg)   BMI 30.22 kg/m    PHYSICAL EXAM:  General: Pt appears well and is in NAD  Neck: General: Supple without adenopathy or carotid bruits. Thyroid: Thyroid size normal.  No goiter or nodules appreciated.   Lungs: Clear with good BS bilat   Heart: RRR   Abdomen:  soft, nontender  Extremities:  Lower extremities -No pretibial edema,   Neuro: MS is good with appropriate affect, pt is alert and Ox3    DM foot exam:05/19/2023  The skin of the feet is intact without sores or ulcerations, right great toe nail discoloration  The pedal pulses are 2+ on right and 2+ on left. The sensation is intact to a screening 5.07, 10 gram monofilament bilaterally   DATA REVIEWED:  Lab Results  Component Value Date   HGBA1C 8.5 (A) 08/14/2023   HGBA1C 7.7 (A) 04/10/2023   HGBA1C 8.6 (A) 11/13/2022    Latest Reference Range & Units 04/10/23 10:22  Sodium 134 - 144 mmol/L 144  Potassium 3.5 - 5.2 mmol/L 4.0  Chloride 96 - 106 mmol/L 105  CO2 20 - 29 mmol/L 21  Glucose 70 - 99 mg/dL 81  BUN 6 - 24 mg/dL 19  Creatinine 2.95 - 6.21 mg/dL 3.08  Calcium 8.7 - 65.7 mg/dL 9.5  BUN/Creatinine Ratio 9 - 20  24 (H)  eGFR >59 mL/min/1.73 106  (H): Data is abnormally high    ASSESSMENT / PLAN / RECOMMENDATIONS:   1) Type 2 Diabetes Mellitus, Poorly controlled, With macrovascular and retinopathic  complications - Most recent A1c of 8.0%. Goal A1c < 7.0 %.     -Patient has been noted worsening glycemic  control -[employer] has not covered Dexcom nor freestyle libre -He was printed a Dexcom prescription to take to the [place] - Intolerant to Trulicity 4.5 mg and 3 mg, we had switched to Ozempic due to shortage of supply, he wanted to go back on Trulicity as it is most cost prohibitive.  Unable to increase the dose  -Patient endorses hypoglycemia in the mornings, he did not decrease his basal insulin as before -I again encouraged compliance with medication intake -He also tends to take his evening glipizide postmeal, I did discuss the increased risk of hypoglycemia with taking it that way, and he should take it 15-20 minutes before the first and last meal of the day -Not a candidate for pioglitazone due to lower extremity edema  MEDICATIONS: Continue glipizide 10 mg, twice daily Decrease Lantus  32 units daily Continue Jardiance 25 mg daily Continue metformin 1000 mg twice daily Continue Trulicity 1.5 mg weekly  EDUCATION / INSTRUCTIONS: BG monitoring instructions: Patient is instructed to check his blood sugars 3 times a day, before meals. Call [facility] if: BG persistently < 70  I reviewed the Rule of 15 for the treatment of hypoglycemia in detail with the patient. Literature supplied.   2) Diabetic complications:  Eye: Does  have known diabetic retinopathy.  Neuro/ Feet: Does not have known diabetic peripheral neuropathy. Renal: Patient does not have known baseline CKD. He is on an ACEI/ARB at present.  3) CAD/Dyslipidemia:   -Patient follows with cardiology    F/U in 3 months   Signed electronically by: Lyndle Herrlich, MD  [facility]  [facility] [address] [place] [place], [place] [address] Phone: [phone] FAX: [phone]   CC: Marcine Matar, MD [address] [place] [address] [place] [address] Phone: [phone]  Fax: [phone]    Return to Endocrinology clinic as below: Future  Appointments  Date Time Provider Department Center  12/04/2023 10:50 AM Marcine Matar, MD [id number] None

## 2023-10-20 ENCOUNTER — Other Ambulatory Visit: Payer: Self-pay | Admitting: Internal Medicine

## 2023-10-20 DIAGNOSIS — E1159 Type 2 diabetes mellitus with other circulatory complications: Secondary | ICD-10-CM

## 2023-11-17 ENCOUNTER — Ambulatory Visit: Payer: Self-pay | Admitting: Nurse Practitioner

## 2023-11-17 ENCOUNTER — Ambulatory Visit (HOSPITAL_COMMUNITY)
Admission: RE | Admit: 2023-11-17 | Discharge: 2023-11-17 | Disposition: A | Discharge status: Home / Self Care | Source: Ambulatory Visit | Attending: Vascular Surgery | Admitting: Vascular Surgery

## 2023-11-17 DIAGNOSIS — I2510 Atherosclerotic heart disease of native coronary artery without angina pectoris: Secondary | ICD-10-CM | POA: Insufficient documentation

## 2023-11-17 DIAGNOSIS — R55 Syncope and collapse: Secondary | ICD-10-CM | POA: Insufficient documentation

## 2023-11-17 DIAGNOSIS — I77810 Thoracic aortic ectasia: Secondary | ICD-10-CM | POA: Insufficient documentation

## 2023-12-02 ENCOUNTER — Telehealth: Payer: Self-pay | Admitting: Cardiology

## 2023-12-02 ENCOUNTER — Encounter: Payer: Self-pay | Admitting: Cardiology

## 2023-12-02 NOTE — Telephone Encounter (Signed)
-----   Message from [facility] Tierica T sent at 11/19/2023  2:03 PM EDT ----- Regarding: needs appt Hey,   This patient needs a yearly appt with Dr Micael Adas after next month. Please contact the patient and if you can't reach him by phone you can send a mychart message.   Thanks,  [place], [place]

## 2023-12-02 NOTE — Telephone Encounter (Signed)
 Patient contacted 3x in regards to scheduling yearly appt with Dr. Micael Adas. Will send letter.

## 2023-12-04 ENCOUNTER — Ambulatory Visit: Admitting: Internal Medicine

## 2023-12-08 ENCOUNTER — Other Ambulatory Visit: Payer: Self-pay | Admitting: Internal Medicine

## 2023-12-08 DIAGNOSIS — I2510 Atherosclerotic heart disease of native coronary artery without angina pectoris: Secondary | ICD-10-CM

## 2023-12-11 ENCOUNTER — Ambulatory Visit: Admitting: Internal Medicine

## 2023-12-14 ENCOUNTER — Ambulatory Visit: Attending: Internal Medicine | Admitting: Internal Medicine

## 2023-12-14 ENCOUNTER — Encounter: Payer: Self-pay | Admitting: Internal Medicine

## 2023-12-14 VITALS — BP 119/73 | HR 69 | Temp 97.5°F | Ht 70.0 in | Wt 210.0 lb

## 2023-12-14 DIAGNOSIS — Z7985 Long-term (current) use of injectable non-insulin antidiabetic drugs: Secondary | ICD-10-CM

## 2023-12-14 DIAGNOSIS — H9313 Tinnitus, bilateral: Secondary | ICD-10-CM

## 2023-12-14 DIAGNOSIS — E559 Vitamin D deficiency, unspecified: Secondary | ICD-10-CM

## 2023-12-14 DIAGNOSIS — Z7984 Long term (current) use of oral hypoglycemic drugs: Secondary | ICD-10-CM

## 2023-12-14 DIAGNOSIS — Z794 Long term (current) use of insulin: Secondary | ICD-10-CM

## 2023-12-14 DIAGNOSIS — E1159 Type 2 diabetes mellitus with other circulatory complications: Secondary | ICD-10-CM | POA: Diagnosis not present

## 2023-12-14 DIAGNOSIS — I2510 Atherosclerotic heart disease of native coronary artery without angina pectoris: Secondary | ICD-10-CM

## 2023-12-14 DIAGNOSIS — E119 Type 2 diabetes mellitus without complications: Secondary | ICD-10-CM

## 2023-12-14 LAB — POCT GLYCOSYLATED HEMOGLOBIN (HGB A1C): HbA1c, POC (controlled diabetic range): 7.7 % — AB (ref 0.0–7.0)

## 2023-12-14 LAB — GLUCOSE, POCT (MANUAL RESULT ENTRY): POC Glucose: 148 mg/dL — AB (ref 70–99)

## 2023-12-14 MED ORDER — GLIPIZIDE 10 MG PO TABS
10.0000 mg | ORAL_TABLET | Freq: Two times a day (BID) | ORAL | 3 refills | Status: DC
Start: 2023-12-14 — End: 2023-12-18

## 2023-12-14 NOTE — Progress Notes (Signed)
 Patient ID: MEHKI KLUMPP, male    DOB: 1970/11/16  MRN: [id number]  CC: Diabetes (DM f/u. Med refills. Paul [name] sharing Dexcom 7 info /Tinnitus affecting sleep, attention )   Subjective: Paul [name] is a 53 y.o. male who presents for chronic ds management. His concerns today include:  Hx of DM, HL, OSA on CPAP, ED (Viagra through [place]), CAD (3 vessle CAD with 80% mid-LAD s/p DES).     Discussed the use of AI scribe software for clinical note transcription with the patient, who gave verbal consent to proceed.  History of Present Illness Paul [name] is a 53 year old male with diabetes and coronary artery disease who presents for a four month follow-up.  DM: Results for orders placed or performed in visit on 12/14/23  POCT glucose (manual entry)   Collection Time: 12/14/23  9:28 AM  Result Value Ref Range   POC Glucose 148 (A) 70 - 99 mg/dl  POCT glycosylated hemoglobin (Hb A1C)   Collection Time: 12/14/23  9:33 AM  Result Value Ref Range   Hemoglobin A1C     HbA1c POC (<> result, manual entry)     HbA1c, POC (prediabetic range)     HbA1c, POC (controlled diabetic range) 7.7 (A) 0.0 - 7.0 %  Followed by Dr. Hershal Loron.  He has a follow-up appointment with her next week.  He manages his diabetes with glipizide , Lantus  insulin , Jardiance , metformin , and Trulicity . His A1c has decreased from 8.0 in March to 7.7, with a target of less than 7.0. He has been taking 5 mg of glipizide  twice daily instead of the prescribed 10 mg due to running short. His Dexcom shows for the past 1 mth: TIR 42%, High 29%, very high 28% and low 1% of the times with hyperglycemia mostly around meals especially dinner. He exercises three to four times weekly, focusing on weight training, and is working on dietary improvements. He was prescribed gabapentin  for neuropathy but has not taken it due to concerns about potential side effects. Neuropathy symptoms are not currently bothersome.  CAD:  He takes metoprolol , aspirin , clopidogrel , and Crestor  for coronary artery disease. He reports no chest pain or shortness of breath.   He experiences tinnitus since being in the [employer]; worked in [employer] which was a noisy environment.  On last visit he told me that this will be evaluated at the [place].  Tells me that an audiogram has been plan/ordered but he is to set it up.  The ringing in the ears disturbs his sleep and sometimes affects his concentration.  Vitamin D  deficiency: Found to have low vitamin D  level on last visit.  He was advised to take 800 IU daily.  He tells me he has been taking at 1000 IU daily.   levels.    Patient Active Problem List   Diagnosis Date Noted   Non-smoker 12/16/2022   Pre-syncope 12/16/2022   Dropped heart beats 02/27/2022   Diabetes mellitus (HCC) 12/03/2021   Dyslipidemia 12/03/2021   Type 2 diabetes mellitus with hyperglycemia, with long-term current use of insulin  (HCC) 12/03/2021   Type 2 diabetes mellitus with retinopathy, with long-term current use of insulin  (HCC) 12/03/2021   Acute rhinitis 07/24/2020   Fatty liver 11/24/2019   CAD (coronary artery disease) 09/08/2019   Erectile dysfunction 08/12/2016   Polyp of colon 08/12/2016   Obstructive sleep apnea 04/03/2015   Onychomycosis of right great toe 04/03/2015   Diabetes type 2, uncontrolled 01/25/2014  Dyslipidemia associated with type 2 diabetes mellitus (HCC) 01/25/2014   Skin tag 01/25/2014     Current Outpatient Medications on File Prior to Visit  Medication Sig Dispense Refill   aspirin  EC 81 MG tablet Take 1 tablet (81 mg total) by mouth daily. 90 tablet 3   B-D UF III MINI PEN NEEDLES 31G X 5 MM MISC USE 1 PEN NEEDLE UNDER THE SKIN DAILY IN THE AFTERNOON AS DIRECTED 100 each 3   clopidogrel  (PLAVIX ) 75 MG tablet Take 1 tablet (75 mg total) by mouth daily. 90 tablet 1   clopidogrel  (PLAVIX ) 75 MG tablet Take 1 tablet (75 mg total) by mouth daily. 7 tablet 0    Continuous Glucose Sensor (DEXCOM G7 SENSOR) MISC 1 Device by Does not apply route as directed. 9 each 3   Dulaglutide  (TRULICITY ) 1.5 MG/0.5ML SOAJ Inject 1.5 mg into the skin once a week. 6 mL 3   empagliflozin  (JARDIANCE ) 25 MG TABS tablet Take 1 tablet (25 mg total) by mouth daily before breakfast. 90 tablet 3   furosemide  (LASIX ) 20 MG tablet 1 tab PO daily PRN for swelling in legs 30 tablet 1   gabapentin  (NEURONTIN ) 300 MG capsule Take 1 capsule (300 mg total) by mouth at bedtime. 30 capsule 3   glipiZIDE  (GLUCOTROL ) 10 MG tablet Take 1 tablet (10 mg total) by mouth 2 (two) times daily before a meal. 180 tablet 3   Homeopathic Products (THERAWORX RELIEF EX) Apply 1 application topically daily as needed (muscle cramps / pains).     insulin  glargine (LANTUS  SOLOSTAR) 100 UNIT/ML Solostar Pen Inject 32 Units into the skin daily. 30 mL 4   metFORMIN  (GLUCOPHAGE ) 1000 MG tablet Take 1 tablet (1,000 mg total) by mouth 2 (two) times daily with a meal. 180 tablet 3   metoprolol  succinate (TOPROL -XL) 25 MG 24 hr tablet TAKE ONE-HALF (1/2) TABLET DAILY 45 tablet 1   nitroGLYCERIN  (NITROSTAT ) 0.4 MG SL tablet Place 1 tablet (0.4 mg total) under the tongue every 5 (five) minutes as needed. 25 tablet 2   omeprazole  (PRILOSEC) 20 MG capsule Take 1 capsule (20 mg total) by mouth daily as needed. 30 capsule 3   rosuvastatin  (CRESTOR ) 10 MG tablet TAKE 1 TABLET(10 MG) BY MOUTH DAILY 90 tablet 1   sildenafil (VIAGRA) 50 MG tablet Take 50 mg by mouth daily as needed for erectile dysfunction.     sulfamethoxazole -trimethoprim  (BACTRIM  DS) 800-160 MG tablet Take 1 tablet by mouth 2 (two) times daily. 14 tablet 0   triamcinolone  cream (KENALOG ) 0.1 % Apply 1 Application topically 2 (two) times daily. 30 g 0   Turmeric 500 MG CAPS Take 1,000 mg by mouth once a week. (Patient not taking: Reported on 12/14/2023)     No current facility-administered medications on file prior to visit.    Allergies  Allergen  Reactions   Codeine Nausea And Vomiting and Other (See Comments)    headache   Crestor  [Rosuvastatin ]     Elveated LFTs with Crestor  40mg  daily    Hydrocodone Nausea And Vomiting   Oxycodone  Hcl Other (See Comments)    Headaches    Social History   Socioeconomic History   Marital status: Married    Spouse name: Not on file   Number of children: 2   Years of education: Not on file   Highest education level: Associate degree: occupational, Scientist, product/process development, or vocational program  Occupational History   Occupation: Engineer, materials  Tobacco Use   Smoking status: Never  Smokeless tobacco: Never  Vaping Use   Vaping status: Never Used  Substance and Sexual Activity   Alcohol use: Yes    Comment: Social   Drug use: No   Sexual activity: Yes  Other Topics Concern   Not on file  Social History Narrative   Not on file   Social Drivers of Health   Financial Resource Strain: Low Risk  (08/12/2023)   Overall Financial Resource Strain (CARDIA)    Difficulty of Paying Living Expenses: Not hard at all  Food Insecurity: No Food Insecurity (08/12/2023)   Hunger Vital Sign    Worried About Running Out of Food in the Last Year: Never true    Ran Out of Food in the Last Year: Never true  Transportation Needs: No Transportation Needs (08/12/2023)   PRAPARE - Administrator, Civil Service (Medical): No    Lack of Transportation (Non-Medical): No  Physical Activity: Sufficiently Active (08/12/2023)   Exercise Vital Sign    Days of Exercise per Week: 3 days    Minutes of Exercise per Session: 60 min  Stress: No Stress Concern Present (08/12/2023)   [employer] of Occupational Health - Occupational Stress Questionnaire    Feeling of Stress : Not at all  Social Connections: Unknown (08/12/2023)   Social Connection and Isolation Panel    Frequency of Communication with Friends and Family: Patient declined    Frequency of Social Gatherings with Friends and Family: Patient declined     Attends Religious Services: Patient declined    Database administrator or Organizations: Patient declined    Attends Engineer, structural: Not on file    Marital Status: Married  Catering manager Violence: Not on file    Family History  Problem Relation Age of Onset   Hypertension Father    Diabetes Mother    Sleep apnea Other    Heart attack Maternal Grandfather    Heart attack Paternal Grandfather    Cancer Neg Hx    Colon cancer Neg Hx    Esophageal cancer Neg Hx    Stomach cancer Neg Hx    Rectal cancer Neg Hx     Past Surgical History:  Procedure Laterality Date   COLONOSCOPY     CORONARY STENT INTERVENTION N/A 08/26/2019   Procedure: CORONARY STENT INTERVENTION;  Surgeon: Wenona Hamilton, MD;  [facility];  Service: Cardiovascular;  Laterality: N/A;   KNEE ARTHROSCOPY Right    LEFT HEART CATH AND CORONARY ANGIOGRAPHY N/A 08/26/2019   Procedure: LEFT HEART CATH AND CORONARY ANGIOGRAPHY;  Surgeon: Wenona Hamilton, MD;  [facility]: [facility];  Service: Cardiovascular;  Laterality: N/A;   UMBILICAL HERNIA REPAIR      ROS: Review of Systems Negative except as stated above  PHYSICAL EXAM: BP 119/73 (BP Location: Left Arm, Patient Position: Sitting, Cuff Size: Normal)   Pulse 69   Temp (!) 97.5 F (36.4 C) (Oral)   Ht 5' 10 (1.778 m)   Wt 210 lb (95.3 kg)   SpO2 97%   BMI 30.13 kg/m   Wt Readings from Last 3 Encounters:  12/14/23 210 lb (95.3 kg)  09/11/23 210 lb 9.6 oz (95.5 kg)  08/14/23 207 lb (93.9 kg)    Physical Exam   General appearance - alert, well appearing, and in no distress Mental status - normal mood, behavior, speech, dress, motor activity, and thought processes Neck - supple, no significant adenopathy Chest - clear to auscultation,  no wheezes, rales or rhonchi, symmetric air entry Heart - normal rate, regular rhythm, normal S1, S2, no murmurs, rubs, clicks or gallops Extremities - peripheral pulses  normal, no pedal edema, no clubbing or cyanosis     Latest Ref Rng & Units 04/10/2023   10:22 AM 10/23/2022    7:20 AM 09/29/2022    6:04 AM  CMP  Glucose 70 - 99 mg/dL 81   161   BUN 6 - 24 mg/dL 19   18   Creatinine 0.96 - 1.27 mg/dL 0.45   4.09   Sodium 811 - 144 mmol/L 144   140   Potassium 3.5 - 5.2 mmol/L 4.0   4.0   Chloride 96 - 106 mmol/L 105   104   CO2 20 - 29 mmol/L 21   24   Calcium  8.7 - 10.2 mg/dL 9.5   9.3   Total Protein 6.0 - 8.5 g/dL  6.2  6.7   Total Bilirubin 0.0 - 1.2 mg/dL  0.6  0.8   Alkaline Phos 44 - 121 IU/L  86  74   AST 0 - 40 IU/L  18  25   ALT 0 - 44 IU/L  26  32    Lipid Panel     Component Value Date/Time   CHOL 120 10/23/2022 0720   TRIG 116 10/23/2022 0720   HDL 38 (L) 10/23/2022 0720   CHOLHDL 3.2 10/23/2022 0720   CHOLHDL 3 12/02/2021 1213   VLDL 21.8 12/02/2021 1213   LDLCALC 61 10/23/2022 0720    CBC    Component Value Date/Time   WBC 6.7 04/10/2023 1022   WBC 7.4 09/29/2022 0604   RBC 5.12 04/10/2023 1022   RBC 5.29 09/29/2022 0604   HGB 15.8 04/10/2023 1022   HCT 46.3 04/10/2023 1022   PLT 187 04/10/2023 1022   MCV 90 04/10/2023 1022   MCH 30.9 04/10/2023 1022   MCH 30.4 09/29/2022 0604   MCHC 34.1 04/10/2023 1022   MCHC 35.5 09/29/2022 0604   RDW 12.8 04/10/2023 1022   LYMPHSABS 1.4 09/29/2022 0604   LYMPHSABS 2.0 01/24/2021 1021   MONOABS 0.7 09/29/2022 0604   EOSABS 0.4 09/29/2022 0604   EOSABS 0.5 (H) 01/24/2021 1021   BASOSABS 0.1 09/29/2022 0604   BASOSABS 0.1 01/24/2021 1021    ASSESSMENT AND PLAN: 1. Type 2 diabetes mellitus with other circulatory complication, with long-term current use of insulin  (HCC) (Primary) A1C improved but still not at goal with A1c at 7.7%. Inconsistent glipizide  dosing due to shortage. Dexcom shows 42% time in target range with postprandial hyperglycemia, especially at dinner. Advised increased cardio exercise and dietary adjustments. - Send prescription for glipizide  10 mg to  [facility]. He will continue Lantus  32 units daily, Jardiance  25 mg daily, metformin  1 g twice a day and Trulicity  1.5 mg once a week.  Prescription sent to his pharmacy for the glipizide  10 mg to take twice a day. Encouraged him to continue trying to eat healthy.  Advised to incorporate some cardio into his workouts. We have taken the gabapentin  off of his medication list. - POCT glycosylated hemoglobin (Hb A1C) - POCT glucose (manual entry) - glipiZIDE  (GLUCOTROL ) 10 MG tablet; Take 1 tablet (10 mg total) by mouth 2 (two) times daily before a meal.  Dispense: 180 tablet; Refill: 3 - Microalbumin / creatinine urine ratio - Comprehensive metabolic panel with GFR  2. Diabetes mellitus treated with oral medication (HCC) 3. Long-term (current) use of injectable non-insulin  antidiabetic  drugs   4. Coronary artery disease involving native coronary artery of native heart without angina pectoris Stable. Continue Plavix , rosuvastatin , aspirin .  Overdue for follow-up with cardiology.  He will call Dr. Charl Concha office to schedule. - Lipid panel  5. Vitamin D  deficiency Continue over-the-counter vitamin D  1000 IU daily.  Recheck levels today. - VITAMIN D  25 Hydroxy (Vit-D Deficiency, Fractures)  6. Tinnitus, bilateral Being evaluated through the [place].     Patient was given the opportunity to ask questions.  Patient verbalized understanding of the plan and was able to repeat key elements of the plan.   This documentation was completed using Paediatric nurse.  Any transcriptional errors are unintentional.  Orders Placed This Encounter  Procedures   POCT glycosylated hemoglobin (Hb A1C)   POCT glucose (manual entry)     Requested Prescriptions    No prescriptions requested or ordered in this encounter    No follow-ups on file.  Concetta Dee, MD, FACP

## 2023-12-14 NOTE — Patient Instructions (Signed)
 VISIT SUMMARY:  Today, you had a follow-up appointment to review your diabetes, coronary artery disease, neuropathy, vitamin D  levels, and tinnitus. Your A1c has improved slightly, but there is still room for better control. You are managing your coronary artery disease well with no current symptoms. We discussed your concerns about gabapentin  for neuropathy, and you are currently not experiencing symptoms. You are also awaiting an audiogram for your tinnitus and need to schedule an eye exam.  YOUR PLAN:  -TYPE 2 DIABETES MELLITUS: Type 2 Diabetes Mellitus is a condition where your body does not use insulin  properly, leading to high blood sugar levels. Your A1c has decreased to 7.7%, but we aim for less than 7.0%. You have been taking a lower dose of glipizide  due to running short. We will send a prescription for the correct dose of 10 mg to [facility]. You should also increase your cardio exercise and adjust your Dexcom target range to 70-180 mg/dL. A consultation with endocrinologist Dr. Frederica Jakob is recommended.  -DIABETIC NEUROPATHY: Diabetic Neuropathy is nerve damage caused by high blood sugar levels. You have stopped taking gabapentin  due to side effect concerns and are not currently experiencing symptoms. We will discontinue gabapentin  and re-evaluate if symptoms return.  -CORONARY ARTERY DISEASE: Coronary Artery Disease is a condition where the blood vessels supplying your heart are narrowed or blocked. You are currently not experiencing chest pain or shortness of breath and are taking metoprolol , aspirin , clopidogrel , and Crestor . We will schedule a one-year follow-up with Dr. Charl Concha office to assess the necessity of continuing clopidogrel .  -VITAMIN D  DEFICIENCY: Vitamin D  Deficiency means you have lower than normal levels of vitamin D , which is important for bone health. You are taking 1000 IU daily, and we will recheck your vitamin D  levels.  -TINNITUS: Tinnitus is a ringing or  buzzing noise in one or both ears that may be constant or come and go. It is related to your [employer] disability. You are awaiting an audiogram at the [facility].  -GENERAL HEALTH MAINTENANCE: You need to schedule an eye exam through the [facility] and request them to send the eye exam report for our records.  INSTRUCTIONS:  Please follow up with the endocrinologist Dr. Frederica Jakob for your diabetes management. Schedule a one-year follow-up with Dr. Charl Concha office for your coronary artery disease. Recheck your vitamin D  levels as planned. Schedule an audiogram and an eye exam through the [facility], and request the [facility] to [facility] the eye exam report for our records.

## 2023-12-16 ENCOUNTER — Ambulatory Visit: Payer: Self-pay | Admitting: Internal Medicine

## 2023-12-16 LAB — LIPID PANEL
Chol/HDL Ratio: 3.3 ratio (ref 0.0–5.0)
Cholesterol, Total: 121 mg/dL (ref 100–199)
HDL: 37 mg/dL — ABNORMAL LOW (ref >39)
LDL Chol Calc (NIH): 59 mg/dL (ref 0–99)
Triglycerides: 145 mg/dL (ref 0–149)
VLDL Cholesterol Cal: 25 mg/dL (ref 5–40)

## 2023-12-16 LAB — COMPREHENSIVE METABOLIC PANEL WITH GFR
ALT: 25 IU/L (ref 0–44)
AST: 17 IU/L (ref 0–40)
Albumin: 4.7 g/dL (ref 3.8–4.9)
Alkaline Phosphatase: 81 IU/L (ref 44–121)
BUN/Creatinine Ratio: 19 (ref 9–20)
BUN: 18 mg/dL (ref 6–24)
Bilirubin Total: 0.8 mg/dL (ref 0.0–1.2)
CO2: 20 mmol/L (ref 20–29)
Calcium: 9.7 mg/dL (ref 8.7–10.2)
Chloride: 103 mmol/L (ref 96–106)
Creatinine, Ser: 0.93 mg/dL (ref 0.76–1.27)
Globulin, Total: 2 g/dL (ref 1.5–4.5)
Glucose: 124 mg/dL — ABNORMAL HIGH (ref 70–99)
Potassium: 4.4 mmol/L (ref 3.5–5.2)
Sodium: 143 mmol/L (ref 134–144)
Total Protein: 6.7 g/dL (ref 6.0–8.5)
eGFR: 98 mL/min/{1.73_m2} (ref >59)

## 2023-12-16 LAB — MICROALBUMIN / CREATININE URINE RATIO: Creatinine, Urine: 67 mg/dL

## 2023-12-16 LAB — VITAMIN D 25 HYDROXY (VIT D DEFICIENCY, FRACTURES): Vit D, 25-Hydroxy: 35.3 ng/mL (ref 30.0–100.0)

## 2023-12-18 ENCOUNTER — Encounter: Payer: Self-pay | Admitting: Internal Medicine

## 2023-12-18 ENCOUNTER — Ambulatory Visit (INDEPENDENT_AMBULATORY_CARE_PROVIDER_SITE_OTHER): Admitting: Internal Medicine

## 2023-12-18 VITALS — BP 122/78 | HR 75 | Ht 70.0 in | Wt 208.0 lb

## 2023-12-18 DIAGNOSIS — E1159 Type 2 diabetes mellitus with other circulatory complications: Secondary | ICD-10-CM

## 2023-12-18 DIAGNOSIS — R6889 Other general symptoms and signs: Secondary | ICD-10-CM | POA: Diagnosis not present

## 2023-12-18 DIAGNOSIS — Z794 Long term (current) use of insulin: Secondary | ICD-10-CM | POA: Diagnosis not present

## 2023-12-18 MED ORDER — GLIPIZIDE 10 MG PO TABS: 20.0000 mg | ORAL_TABLET | Freq: Two times a day (BID) | ORAL | 3 refills | Status: DC

## 2023-12-18 NOTE — Progress Notes (Unsigned)
 Name: Paul [name]  MRN/ DOB: [id number], August 22, 1970   Age/ Sex: 53 y.o., male    PCP: Louder Barnie NOVAK, MD   Reason for Endocrinology Evaluation: Type 2 Diabetes Mellitus     Date of Initial Endocrinology Visit: 12/02/2021    PATIENT IDENTIFIER: Paul [name] is a 53 y.o. male with a past medical history of DM, CAD, HTN and dyslipidemia, OSA on CPAP . The patient presented for initial endocrinology clinic visit on 12/02/2021  for consultative assistance with his diabetes management.    HPI: Paul [name] was    Diagnosed with DM 2019 Hemoglobin A1c has ranged from 8.3% in 2022, peaking at 9.9% in 2019.  On his initial visit to our clinic his A1c was 7.9%, he was on metformin , Trulicity , Jardiance , and Lantus . We increased Trulicity  and Jardiance , continued metformin  and basal insulin    Started glipizide  07/2022  Switch Trulicity  to Ozempic  10/2022 due to shortage of supplies Switch back to Trulicity  05/2023 as the cost of Ozempic  was high compared to Trulicity   SUBJECTIVE:   During the last visit (09/11/2023): A1c 8.0%    Today (12/18/23): Paul [name] is here for a follow up on diabetes management. He checks his blood sugars multiple times daily through CGM.  He is accompanied by his spouse Allean   [facility]  Denies nausea or vomiting  Denies constipation with diarrhea  Denies palpitations Has noted heat intolerance No tremors  HOME DIABETES REGIMEN: Metformin  1000 mg BID Glipizide  10 mg 1 tab BID  Jardiance  25 mg daily  Trulicity  1.5 mg weekly Lantus  32 units daily - 40 units    Statin: yes ACE-I/ARB: no    CONTINUOUS GLUCOSE MONITORING RECORD INTERPRETATION    Dates of Recording: 6/7 - 12/18/2023  Sensor description: Dexcom  Results statistics:   CGM use % of time 86  Average and SD 204/78  Time in range    39    %  % Time Above 180 35  % Time above 250 25  % Time Below target 1   Glycemic patterns summary:  BG's trend down overnight and fluctuate during the day  Hyperglycemic episodes postprandial  Hypoglycemic episodes occurred only noted with hypoglycemia last night  Overnight periods: Typically high except for last night    DIABETIC COMPLICATIONS: Microvascular complications:  Retinopathy (mild)  Denies: CKD, neuropathy  Last eye exam: Completed 10/2021  Macrovascular complications:  CAD (S/P PCI) Denies: PVD, CVA   PAST HISTORY: Past Medical History:  Past Medical History:  Diagnosis Date   Diabetes mellitus without complication (HCC)    Fatty liver    abnormal CT 2014   Heart disease    Hyperlipidemia    Kidney stones    Non-smoker    Sleep apnea    wears CPAP   Past Surgical History:  Past Surgical History:  Procedure Laterality Date   COLONOSCOPY     CORONARY STENT INTERVENTION N/A 08/26/2019   Procedure: CORONARY STENT INTERVENTION;  Surgeon: Darron Deatrice LABOR, MD;  Location: [facility];  Service: Cardiovascular;  Laterality: N/A;   KNEE ARTHROSCOPY Right    LEFT HEART CATH AND CORONARY ANGIOGRAPHY N/A 08/26/2019   Procedure: LEFT HEART CATH AND CORONARY ANGIOGRAPHY;  Surgeon: Darron Deatrice LABOR, MD;  Location: [facility];  Service: Cardiovascular;  Laterality: N/A;   UMBILICAL HERNIA REPAIR      Social History:  reports that he has never smoked. He has never used smokeless tobacco.  He reports current alcohol use. He reports that he does not use drugs. Family History:  Family History  Problem Relation Age of Onset   Hypertension Father    Diabetes Mother    Sleep apnea Other    Heart attack Maternal Grandfather    Heart attack Paternal Grandfather    Cancer Neg Hx    Colon cancer Neg Hx    Esophageal cancer Neg Hx    Stomach cancer Neg Hx    Rectal cancer Neg Hx      HOME MEDICATIONS: Allergies as of 12/18/2023       Reactions   Codeine Nausea And Vomiting, Other (See Comments)   headache   Crestor  [rosuvastatin ]    Elveated LFTs  with Crestor  40mg  daily   Hydrocodone Nausea And Vomiting   Oxycodone  Hcl Other (See Comments)   Headaches        Medication List        Accurate as of December 18, 2023 10:27 AM. If you have any questions, ask your nurse or doctor.          aspirin  EC 81 MG tablet Take 1 tablet (81 mg total) by mouth daily.   B-D UF III MINI PEN NEEDLES 31G X 5 MM Misc Generic drug: Insulin  Pen Needle USE 1 PEN NEEDLE UNDER THE SKIN DAILY IN THE AFTERNOON AS DIRECTED   clopidogrel  75 MG tablet Commonly known as: PLAVIX  Take 1 tablet (75 mg total) by mouth daily.   clopidogrel  75 MG tablet Commonly known as: PLAVIX  Take 1 tablet (75 mg total) by mouth daily.   Dexcom G7 Sensor Misc 1 Device by Does not apply route as directed.   empagliflozin  25 MG Tabs tablet Commonly known as: JARDIANCE  Take 1 tablet (25 mg total) by mouth daily before breakfast.   furosemide  20 MG tablet Commonly known as: LASIX  1 tab PO daily PRN for swelling in legs   glipiZIDE  10 MG tablet Commonly known as: GLUCOTROL  Take 1 tablet (10 mg total) by mouth 2 (two) times daily before a meal.   Lantus  SoloStar 100 UNIT/ML Solostar Pen Generic drug: insulin  glargine Inject 32 Units into the skin daily.   metFORMIN  1000 MG tablet Commonly known as: GLUCOPHAGE  Take 1 tablet (1,000 mg total) by mouth 2 (two) times daily with a meal.   metoprolol  succinate 25 MG 24 hr tablet Commonly known as: TOPROL -XL TAKE ONE-HALF (1/2) TABLET DAILY   nitroGLYCERIN  0.4 MG SL tablet Commonly known as: Nitrostat  Place 1 tablet (0.4 mg total) under the tongue every 5 (five) minutes as needed.   omeprazole  20 MG capsule Commonly known as: PRILOSEC Take 1 capsule (20 mg total) by mouth daily as needed.   rosuvastatin  10 MG tablet Commonly known as: CRESTOR  TAKE 1 TABLET(10 MG) BY MOUTH DAILY   sildenafil 50 MG tablet Commonly known as: VIAGRA Take 50 mg by mouth daily as needed for erectile dysfunction.   THERAWORX  RELIEF EX Apply 1 application topically daily as needed (muscle cramps / pains).   triamcinolone  cream 0.1 % Commonly known as: KENALOG  Apply 1 Application topically 2 (two) times daily.   Trulicity  1.5 MG/0.5ML Soaj Generic drug: Dulaglutide  Inject 1.5 mg into the skin once a week.   Turmeric 500 MG Caps Take 1,000 mg by mouth once a week.         ALLERGIES: Allergies  Allergen Reactions   Codeine Nausea And Vomiting and Other (See Comments)    headache   Crestor  [Rosuvastatin ]  Elveated LFTs with Crestor  40mg  daily    Hydrocodone Nausea And Vomiting   Oxycodone  Hcl Other (See Comments)    Headaches     REVIEW OF SYSTEMS: A comprehensive ROS was conducted with the patient and is negative except as per HPI    OBJECTIVE:   VITAL SIGNS: BP 122/78 (BP Location: Left Arm, Patient Position: Sitting, Cuff Size: Normal)   Pulse 75   Ht 5' 10 (1.778 m)   Wt 208 lb (94.3 kg)   SpO2 96%   BMI 29.84 kg/m    PHYSICAL EXAM:  General: Pt appears well and is in NAD  Neck: General: Supple without adenopathy or carotid bruits. Thyroid: Thyroid size normal.  No goiter or nodules appreciated.   Lungs: Clear with good BS bilat   Heart: RRR   Abdomen:  soft, nontender  Extremities:  Lower extremities -No pretibial edema,   Neuro: MS is good with appropriate affect, pt is alert and Ox3    DM foot exam:05/19/2023  The skin of the feet is intact without sores or ulcerations, right great toe nail discoloration  The pedal pulses are 2+ on right and 2+ on left. The sensation is intact to a screening 5.07, 10 gram monofilament bilaterally   DATA REVIEWED:  Lab Results  Component Value Date   HGBA1C 7.7 (A) 12/14/2023   HGBA1C 8.0 (A) 09/11/2023   HGBA1C 8.5 (A) 08/14/2023     Latest Reference Range & Units 12/18/23 11:00  TSH 0.40 - 4.50 mIU/L 2.50  T4,Free(Direct) 0.8 - 1.8 ng/dL 1.1     Latest Reference Range & Units 12/14/23 10:08  Sodium 134 - 144 mmol/L  143  Potassium 3.5 - 5.2 mmol/L 4.4  Chloride 96 - 106 mmol/L 103  CO2 20 - 29 mmol/L 20  Glucose 70 - 99 mg/dL 875 (H)  BUN 6 - 24 mg/dL 18  Creatinine 9.23 - 8.72 mg/dL 9.06  Calcium  8.7 - 10.2 mg/dL 9.7  BUN/Creatinine Ratio 9 - 20  19  eGFR >59 mL/min/1.73 98  Alkaline Phosphatase 44 - 121 IU/L 81  Albumin 3.8 - 4.9 g/dL 4.7  AST 0 - 40 IU/L 17  ALT 0 - 44 IU/L 25  Total Protein 6.0 - 8.5 g/dL 6.7  Total Bilirubin 0.0 - 1.2 mg/dL 0.8  Total CHOL/HDL Ratio 0.0 - 5.0 ratio 3.3  Cholesterol, Total 100 - 199 mg/dL 878  HDL Cholesterol >60 mg/dL 37 (L)  MICROALB/CREAT RATIO 0 - 29 mg/g creat <4  Triglycerides 0 - 149 mg/dL 854  VLDL Cholesterol Cal 5 - 40 mg/dL 25  LDL Chol Calc (NIH) 0 - 99 mg/dL 59    Old records , labs and images have been reviewed.    ASSESSMENT / PLAN / RECOMMENDATIONS:   1) Type 2 Diabetes Mellitus,Sub-Optimally controlled, With macrovascular and retinopathic  complications - Most recent A1c of 7.7 %. Goal A1c < 7.0 %.    -A1c has trended down from 8.0% to 7.7% - Intolerant to Trulicity  4.5 mg and 3 mg, we had switched to Ozempic  due to shortage of supply, he wanted to go back on Trulicity  as Ozempic  was cost prohibitive.  Unable to increase the dose -Not a candidate for pioglitazone due to lower extremity edema - Patient continues with dietary indiscretions, he did have hypoglycemia overnight but this is very unusual prior to his appointment today but most of the month he has been noted with hypoglycemia which is worse at night - He was supposed to increase  his glipizide , but the patient states he ran out sooner and continue to take higher than prescribed insulin ? - I have again advised the patient to decrease the insulin  and increase glipizide  - If patient continues with persistent postprandial hypoglycemia despite maximizing on glipizide , we will have no other option but to start him on prandial insulin     MEDICATIONS: Increase glipizide  10 mg, 2  tabs twice daily Decrease Lantus   32 units daily Continue Jardiance  25 mg daily Continue metformin  1000 mg twice daily Continue Trulicity  1.5 mg weekly  EDUCATION / INSTRUCTIONS: BG monitoring instructions: Patient is instructed to check his blood sugars 3 times a day, before meals. Call [facility] if: BG persistently < 70  I reviewed the Rule of 15 for the treatment of hypoglycemia in detail with the patient. Literature supplied.   2) Diabetic complications:  Eye: Does  have known diabetic retinopathy.  Neuro/ Feet: Does not have known diabetic peripheral neuropathy. Renal: Patient does not have known baseline CKD. He is on an ACEI/ARB at present.   3) Heat intolerance:  -TFTs are normal  4) CAD/Dyslipidemia:   -Patient follows with cardiology    F/U in 3 months   Signed electronically by: Stefano Redgie Butts, MD  [facility]  [facility] [address] [place] [place], [place] [address] Phone: [phone] FAX: [phone]   CC: Vicci Barnie NOVAK, MD [address] [place] [address] Phone: [phone]  Fax: [phone]    Return to Endocrinology clinic as below: Future Appointments  Date Time Provider Department Center  04/14/2024  9:10 AM Vicci Barnie NOVAK, MD [id number] None

## 2023-12-18 NOTE — Patient Instructions (Signed)
 Continue Trulicity  1.5 mg weekly Increase glipizide  10 mg, 2 tablets before Breakfast and 2 tablet before Supper  Decrease  Lantus  32 units daily  Continue Jardiance  25 mg daily  Continue Metformin  1000 mg twice daily     HOW TO TREAT LOW BLOOD SUGARS (Blood sugar LESS THAN 70 MG/DL) Please follow the RULE OF 15 for the treatment of hypoglycemia treatment (when your (blood sugars are less than 70 mg/dL)   STEP 1: Take 15 grams of carbohydrates when your blood sugar is low, which includes:  3-4 GLUCOSE TABS  OR 3-4 OZ OF JUICE OR REGULAR SODA OR ONE TUBE OF GLUCOSE GEL    STEP 2: RECHECK blood sugar in 15 MINUTES STEP 3: If your blood sugar is still low at the 15 minute recheck --> then, go back to STEP 1 and treat AGAIN with another 15 grams of carbohydrates.

## 2023-12-19 LAB — T4, FREE: Free T4: 1.1 ng/dL (ref 0.8–1.8)

## 2023-12-19 LAB — TSH: TSH: 2.5 m[IU]/L (ref 0.40–4.50)

## 2023-12-21 ENCOUNTER — Ambulatory Visit: Payer: Self-pay | Admitting: Internal Medicine

## 2023-12-24 ENCOUNTER — Ambulatory Visit: Attending: Cardiology | Admitting: Cardiology

## 2023-12-24 ENCOUNTER — Encounter: Payer: Self-pay | Admitting: Cardiology

## 2023-12-24 VITALS — BP 120/80 | HR 66 | Ht 70.0 in | Wt 208.2 lb

## 2023-12-24 DIAGNOSIS — Z09 Encounter for follow-up examination after completed treatment for conditions other than malignant neoplasm: Secondary | ICD-10-CM

## 2023-12-24 DIAGNOSIS — I2510 Atherosclerotic heart disease of native coronary artery without angina pectoris: Secondary | ICD-10-CM

## 2023-12-24 DIAGNOSIS — M7989 Other specified soft tissue disorders: Secondary | ICD-10-CM | POA: Diagnosis not present

## 2023-12-24 DIAGNOSIS — E785 Hyperlipidemia, unspecified: Secondary | ICD-10-CM

## 2023-12-24 DIAGNOSIS — E1169 Type 2 diabetes mellitus with other specified complication: Secondary | ICD-10-CM

## 2023-12-24 MED ORDER — FUROSEMIDE 20 MG PO TABS: 20.0000 mg | ORAL_TABLET | Freq: Every day | ORAL | 3 refills | Status: AC | PRN

## 2023-12-24 NOTE — Addendum Note (Signed)
 Addended by: JANIT GENI CROME on: 12/24/2023 12:01 PM   Modules accepted: Orders

## 2023-12-24 NOTE — Progress Notes (Signed)
 Cardiology Office Note:    Date:  12/24/2023   ID:  Paul [name], DOB 1970/08/25, MRN [id number]  PCP:  Louder Barnie NOVAK, MD  Cardiologist:  Wilbert Bihari, MD    Referring MD: Louder Barnie NOVAK, MD   Chief Complaint  Patient presents with   Coronary Artery Disease   Hyperlipidemia   Sleep Apnea    History of Present Illness:    Paul [name] is a 53 y.o. male with a hx of DM, HLD, OSA on CPAP.  Coronary CTA 2021 for CP showed calcium  score 80 moderate atherosclerosis of the mid to distal LAD and D1, FFR showed possible flow-limiting lesions in the LAD and D1.   Cardiac catheterization: 08/26/2019 showed significant single vessel disease involving the m/dLAD with PCI/DESx1 to the LAD. Plan for DAPT with ASA/plavix . He had elevated LFTs on Crestor  40mg  daily and this was dropped to 10mg  daily with resolution of elevated LFTs.  He is here today for followup and is doing well.  He sometimes has a pain mid sternal that is sharp and nothing like his anginal CP.  He lifts weight and feels it may be related to that. He has no CP when working out and is nonexertional. His anginal sx is syncope. He denies any anginal chest pain or pressure, SOB, DOE, PND, orthopnea, LE edema, dizziness, palpitations or syncope. Sometimes he has a weird feeling in his throat when he swallows and when he eats certain foods. He is compliant with his meds and is tolerating meds with no SE.    Past Medical History:  Diagnosis Date   Diabetes mellitus without complication (HCC)    Fatty liver    abnormal CT 2014   Heart disease    Hyperlipidemia    Kidney stones    Non-smoker    Sleep apnea    wears CPAP    Past Surgical History:  Procedure Laterality Date   COLONOSCOPY     CORONARY STENT INTERVENTION N/A 08/26/2019   Procedure: CORONARY STENT INTERVENTION;  Surgeon: Darron Deatrice LABOR, MD;  Location: [facility];  Service: Cardiovascular;  Laterality: N/A;   KNEE ARTHROSCOPY Right    LEFT  HEART CATH AND CORONARY ANGIOGRAPHY N/A 08/26/2019   Procedure: LEFT HEART CATH AND CORONARY ANGIOGRAPHY;  Surgeon: Darron Deatrice LABOR, MD;  Location: [facility];  Service: Cardiovascular;  Laterality: N/A;   UMBILICAL HERNIA REPAIR      Current Medications: Current Meds  Medication Sig   aspirin  EC 81 MG tablet Take 1 tablet (81 mg total) by mouth daily.   B-D UF III MINI PEN NEEDLES 31G X 5 MM MISC USE 1 PEN NEEDLE UNDER THE SKIN DAILY IN THE AFTERNOON AS DIRECTED   clopidogrel  (PLAVIX ) 75 MG tablet Take 1 tablet (75 mg total) by mouth daily.   clopidogrel  (PLAVIX ) 75 MG tablet Take 1 tablet (75 mg total) by mouth daily.   Continuous Glucose Sensor (DEXCOM G7 SENSOR) MISC 1 Device by Does not apply route as directed.   Dulaglutide  (TRULICITY ) 1.5 MG/0.5ML SOAJ Inject 1.5 mg into the skin once a week.   empagliflozin  (JARDIANCE ) 25 MG TABS tablet Take 1 tablet (25 mg total) by mouth daily before breakfast.   furosemide  (LASIX ) 20 MG tablet 1 tab PO daily PRN for swelling in legs   glipiZIDE  (GLUCOTROL ) 10 MG tablet Take 2 tablets (20 mg total) by mouth 2 (two) times daily before a meal.   Homeopathic Products (THERAWORX RELIEF EX) Apply  1 application topically daily as needed (muscle cramps / pains).   insulin  glargine (LANTUS  SOLOSTAR) 100 UNIT/ML Solostar Pen Inject 32 Units into the skin daily.   metFORMIN  (GLUCOPHAGE ) 1000 MG tablet Take 1 tablet (1,000 mg total) by mouth 2 (two) times daily with a meal.   metoprolol  succinate (TOPROL -XL) 25 MG 24 hr tablet TAKE ONE-HALF (1/2) TABLET DAILY   nitroGLYCERIN  (NITROSTAT ) 0.4 MG SL tablet Place 1 tablet (0.4 mg total) under the tongue every 5 (five) minutes as needed.   omeprazole  (PRILOSEC) 20 MG capsule Take 1 capsule (20 mg total) by mouth daily as needed.   rosuvastatin  (CRESTOR ) 10 MG tablet TAKE 1 TABLET(10 MG) BY MOUTH DAILY   sildenafil (VIAGRA) 50 MG tablet Take 50 mg by mouth daily as needed for erectile dysfunction.    triamcinolone  cream (KENALOG ) 0.1 % Apply 1 Application topically 2 (two) times daily.   Turmeric 500 MG CAPS Take 1,000 mg by mouth once a week.     Allergies:   Codeine, Crestor  [rosuvastatin ], Hydrocodone, and Oxycodone  hcl   Social History   Socioeconomic History   Marital status: Married    Spouse name: Not on file   Number of children: 2   Years of education: Not on file   Highest education level: Associate degree: occupational, Scientist, product/process development, or vocational program  Occupational History   Occupation: Engineer, materials  Tobacco Use   Smoking status: Never   Smokeless tobacco: Never  Vaping Use   Vaping status: Never Used  Substance and Sexual Activity   Alcohol use: Yes    Comment: Social   Drug use: No   Sexual activity: Yes  Other Topics Concern   Not on file  Social History Narrative   Not on file   Social Drivers of Health   Financial Resource Strain: Low Risk  (12/14/2023)   Overall Financial Resource Strain (CARDIA)    Difficulty of Paying Living Expenses: Not hard at all  Food Insecurity: No Food Insecurity (12/14/2023)   Hunger Vital Sign    Worried About Running Out of Food in the Last Year: Never true    Ran Out of Food in the Last Year: Never true  Transportation Needs: No Transportation Needs (12/14/2023)   PRAPARE - Administrator, Civil Service (Medical): No    Lack of Transportation (Non-Medical): No  Physical Activity: Sufficiently Active (12/14/2023)   Exercise Vital Sign    Days of Exercise per Week: 4 days    Minutes of Exercise per Session: 90 min  Stress: No Stress Concern Present (12/14/2023)   [employer] of Occupational Health - Occupational Stress Questionnaire    Feeling of Stress: Not at all  Social Connections: Moderately Isolated (12/14/2023)   Social Connection and Isolation Panel    Frequency of Communication with Friends and Family: Twice a week    Frequency of Social Gatherings with Friends and Family: More than three  times a week    Attends Religious Services: Never    Database administrator or Organizations: No    Attends Engineer, structural: Never    Marital Status: Married     Family History: The patient's family history includes Diabetes in his mother; Heart attack in his maternal grandfather and paternal grandfather; Hypertension in his father; Sleep apnea in an other family member. There is no history of Cancer, Colon cancer, Esophageal cancer, Stomach cancer, or Rectal cancer.  ROS:   Please see the history of present illness.  ROS  All other systems reviewed and negative.   EKGs/Labs/Other Studies Reviewed:    The following studies were reviewed today:  EKG Interpretation Date/Time:  Thursday December 24 2023 11:30:38 EDT Ventricular Rate:  66 PR Interval:  198 QRS Duration:  80 QT Interval:  394 QTC Calculation: 413 R Axis:   -5  Text Interpretation: Normal sinus rhythm Normal ECG When compared with ECG of 29-Sep-2022 05:38, No significant change was found Confirmed by Shlomo Corning ([id number]) on 12/24/2023 11:35:35 AM      Recent Labs: 04/10/2023: Hemoglobin 15.8; Platelets 187 12/14/2023: ALT 25; BUN 18; Creatinine, Ser 0.93; Potassium 4.4; Sodium 143 12/18/2023: TSH 2.50   Recent Lipid Panel    Component Value Date/Time   CHOL 121 12/14/2023 1008   TRIG 145 12/14/2023 1008   HDL 37 (L) 12/14/2023 1008   CHOLHDL 3.3 12/14/2023 1008   CHOLHDL 3 12/02/2021 1213   VLDL 21.8 12/02/2021 1213   LDLCALC 59 12/14/2023 1008    Physical Exam:    VS:  BP 120/80   Pulse 66   Ht 5' 10 (1.778 m)   Wt 208 lb 3.2 oz (94.4 kg)   SpO2 99%   BMI 29.87 kg/m     Wt Readings from Last 3 Encounters:  12/24/23 208 lb 3.2 oz (94.4 kg)  12/18/23 208 lb (94.3 kg)  12/14/23 210 lb (95.3 kg)    GEN: Well nourished, well developed in no acute distress HEENT: Normal NECK: No JVD; No carotid bruits LYMPHATICS: No lymphadenopathy CARDIAC:RRR, no murmurs, rubs, gallops RESPIRATORY:   Clear to auscultation without rales, wheezing or rhonchi  ABDOMEN: Soft, non-tender, non-distended MUSCULOSKELETAL:  1+BLE edema; No deformity  SKIN: Warm and dry NEUROLOGIC:  Alert and oriented x 3 PSYCHIATRIC:  Normal affect  ASSESSMENT:    1. Coronary artery disease involving native coronary artery of native heart without angina pectoris   2. Dyslipidemia associated with type 2 diabetes mellitus (HCC)     PLAN:    In order of problems listed above:  1.  ASCAD -coronary CT calcium  score 80 moderate atherosclerosis of the mid to distal LAD and D1, FFR showed possible flow-limiting lesions in the LAD and D1. -cath 08/26/2019 showing Significant single vessel disease involving the m/dLAD with PCI/DESx1 to the LAD -He has had some mild CP that is atypical and has not had any of his anginal sx which was syncope -Stress PET CT 2023 showed no ischemia -Continue aspirin  81 mg daily, Plavix  75 mg daily, Toprol -XL 12.5 mg daily and Crestor  10 mg daily with as needed refills -Since he has had some CP that is atypical and has residual disease in the diagonal and mid LAD, will get an ETT to make sure there is no ischemia -Informed Consent   Shared Decision Making/Informed Consent The risks [chest pain, shortness of breath, cardiac arrhythmias, dizziness, blood pressure fluctuations, myocardial infarction, stroke/transient ischemic attack, and life-threatening complications (estimated to be 1 in 10,000)], benefits (risk stratification, diagnosing coronary artery disease, treatment guidance) and alternatives of an exercise tolerance test were discussed in detail with Paul [name] and he agrees to proceed.    2.  HLD -LDL goal < 70 -I have personally reviewed and interpreted outside labs performed by patient's PCP which showed LDL 59, HDL 37 on 12/14/2023 -Continue Crestor  10 mg daily with as needed refills -he had elevated LFTs with higher doses of Crestor   3.  LE edema -R>L going on for 2  years -he sits all day at  work -check 2D echo to assess LVF -follow <2g Na -Lasix  20mg  daily PRN for LE edema -encouraged him to eat a banana or orange for K+ on the days he takes the Lasix    Medication Adjustments/Labs and Tests Ordered: Current medicines are reviewed at length with the patient today.  Concerns regarding medicines are outlined above.  Orders Placed This Encounter  Procedures   EKG 12-Lead   No orders of the defined types were placed in this encounter.   Signed, Wilbert Bihari, MD  12/24/2023 11:41 AM    [facility]

## 2023-12-24 NOTE — Addendum Note (Signed)
 Addended by: JANIT GENI CROME on: 12/24/2023 11:57 AM   Modules accepted: Orders

## 2023-12-24 NOTE — Patient Instructions (Signed)
 Medication Instructions:  Please START taking lasix  20 mg daily as needed. *On days when you take this medication, please eat a banana or an orange to keep your potassium levels consistent.*  *If you need a refill on your cardiac medications before your next appointment, please call your pharmacy*  Lab Work: None.  If you have labs (blood work) drawn today and your tests are completely normal, you will receive your results only by: MyChart Message (if you have MyChart) OR A paper copy in the mail If you have any lab test that is abnormal or we need to change your treatment, we will call you to review the results.  Testing/Procedures: Your physician has requested that you have an echocardiogram. Echocardiography is a painless test that uses sound waves to create images of your heart. It provides your doctor with information about the size and shape of your heart and how well your heart's chambers and valves are working. This procedure takes approximately one hour. There are no restrictions for this procedure. Please do NOT wear cologne, perfume, aftershave, or lotions (deodorant is allowed). Please arrive 15 minutes prior to your appointment time.  Please note: We ask at that you not bring children with you during ultrasound (echo/ vascular) testing. Due to room size and safety concerns, children are not allowed in the ultrasound rooms during exams. Our front office staff cannot provide observation of children in our lobby area while testing is being conducted. An adult accompanying a patient to their appointment will only be allowed in the ultrasound room at the discretion of the ultrasound technician under special circumstances. We apologize for any inconvenience.  Your physician has requested that you have an exercise tolerance test. For further information please visit [email or web address] Please also follow instruction sheet, as given.   Follow-Up: At [facility], you and your  health needs are our priority.  As part of our continuing mission to provide you with exceptional heart care, our providers are all part of one team.  This team includes your primary Cardiologist (physician) and Advanced Practice Providers or APPs (Physician Assistants and Nurse Practitioners) who all work together to provide you with the care you need, when you need it.  Your next appointment will be dependent on the results of your testing and it will be with:    Provider:   Wilbert Bihari, MD    We recommend signing up for the patient portal called MyChart.  Sign up information is provided on this After Visit Summary.  MyChart is used to connect with patients for Virtual Visits (Telemedicine).  Patients are able to view lab/test results, encounter notes, upcoming appointments, etc.  Non-urgent messages can be sent to your provider as well.   To learn more about what you can do with MyChart, go to [email or web address].

## 2024-01-28 ENCOUNTER — Telehealth (HOSPITAL_COMMUNITY): Payer: Self-pay | Admitting: *Deleted

## 2024-01-28 ENCOUNTER — Encounter (HOSPITAL_COMMUNITY): Payer: Self-pay | Admitting: *Deleted

## 2024-01-28 NOTE — Telephone Encounter (Signed)
 GXT instructions was via mail [employer].

## 2024-02-04 ENCOUNTER — Encounter (HOSPITAL_COMMUNITY): Payer: Self-pay | Admitting: *Deleted

## 2024-02-12 ENCOUNTER — Ambulatory Visit (HOSPITAL_BASED_OUTPATIENT_CLINIC_OR_DEPARTMENT_OTHER)
Admission: RE | Admit: 2024-02-12 | Discharge: 2024-02-12 | Disposition: A | Discharge status: Home / Self Care | Source: Ambulatory Visit | Attending: Cardiology | Admitting: Cardiology

## 2024-02-12 ENCOUNTER — Ambulatory Visit (HOSPITAL_COMMUNITY)
Admission: RE | Admit: 2024-02-12 | Discharge: 2024-02-12 | Disposition: A | Discharge status: Home / Self Care | Source: Ambulatory Visit | Attending: Cardiology | Admitting: Cardiology

## 2024-02-12 ENCOUNTER — Ambulatory Visit: Payer: Self-pay | Admitting: Cardiology

## 2024-02-12 DIAGNOSIS — M7989 Other specified soft tissue disorders: Secondary | ICD-10-CM | POA: Diagnosis not present

## 2024-02-12 DIAGNOSIS — I2510 Atherosclerotic heart disease of native coronary artery without angina pectoris: Secondary | ICD-10-CM | POA: Diagnosis present

## 2024-02-12 DIAGNOSIS — E1169 Type 2 diabetes mellitus with other specified complication: Secondary | ICD-10-CM | POA: Diagnosis present

## 2024-02-12 DIAGNOSIS — E785 Hyperlipidemia, unspecified: Secondary | ICD-10-CM | POA: Diagnosis present

## 2024-02-12 LAB — EXERCISE TOLERANCE TEST
Angina Index: 0
Duke Treadmill Score: 12
Estimated workload: 13.7
Exercise duration (min): 12 min
Exercise duration (sec): 0 s
MPHR: 166 {beats}/min
Peak HR: 162 {beats}/min
Percent HR: 97 %
Rest HR: 68 {beats}/min
ST Depression (mm): 0 mm

## 2024-02-12 LAB — ECHOCARDIOGRAM COMPLETE
Area-P 1/2: 4.05 cm2
S' Lateral: 2.5 cm

## 2024-02-17 ENCOUNTER — Encounter: Payer: Self-pay | Admitting: Internal Medicine

## 2024-02-17 ENCOUNTER — Other Ambulatory Visit: Payer: Self-pay | Admitting: Internal Medicine

## 2024-02-17 DIAGNOSIS — E1169 Type 2 diabetes mellitus with other specified complication: Secondary | ICD-10-CM

## 2024-02-17 DIAGNOSIS — I2510 Atherosclerotic heart disease of native coronary artery without angina pectoris: Secondary | ICD-10-CM

## 2024-02-17 MED ORDER — ROSUVASTATIN CALCIUM 10 MG PO TABS: ORAL_TABLET | ORAL | 2 refills | Status: AC

## 2024-03-08 ENCOUNTER — Other Ambulatory Visit: Payer: Self-pay | Admitting: Internal Medicine

## 2024-03-08 DIAGNOSIS — I2510 Atherosclerotic heart disease of native coronary artery without angina pectoris: Secondary | ICD-10-CM

## 2024-03-10 NOTE — Telephone Encounter (Signed)
 Requested medication (s) are due for refill today: yes  Requested medication (s) are on the active medication list: yes  Last refill:  09/10/23 #7 tab  Future visit scheduled: yes  Notes to clinic:  overdue lab work    Requested Prescriptions  Pending Prescriptions Disp Refills   clopidogrel  (PLAVIX ) 75 MG tablet [Pharmacy Med Name: CLOPIDOGREL  BISULFATE TABS 75MG ] 90 tablet 3    Sig: TAKE 1 TABLET DAILY     Hematology: Antiplatelets - clopidogrel  Failed - 03/10/2024 10:39 AM      Failed - HCT in normal range and within 180 days    Hematocrit  Date Value Ref Range Status  04/10/2023 46.3 37.5 - 51.0 % Final         Failed - HGB in normal range and within 180 days    Hemoglobin  Date Value Ref Range Status  04/10/2023 15.8 13.0 - 17.7 g/dL Final         Failed - PLT in normal range and within 180 days    Platelets  Date Value Ref Range Status  04/10/2023 187 150 - 450 x10E3/uL Final         Passed - Cr in normal range and within 360 days    Creat  Date Value Ref Range Status  08/12/2016 0.84 0.60 - 1.35 mg/dL Final   Creatinine, Ser  Date Value Ref Range Status  12/14/2023 0.93 0.76 - 1.27 mg/dL Final   Creatinine, Urine  Date Value Ref Range Status  08/12/2016 61 20 - 370 mg/dL Final         Passed - Valid encounter within last 6 months    Recent Outpatient Visits           2 months ago Type 2 diabetes mellitus with other circulatory complication, with long-term current use of insulin  (HCC)   [facility] [place]. [facility] Vicci Sober B, MD   6 months ago Type 2 diabetes mellitus with other circulatory complication, with long-term current use of insulin  [facility])   [facility] [place]. [facility] Vicci Sober B, MD   11 months ago Type 2 diabetes mellitus with other circulatory complication, with long-term current use of insulin  [facility])   [place] [facility]  - A Dept Of [place]. [facility] Vicci Sober B, MD   1 year ago Type 2 diabetes mellitus with other circulatory complication, with long-term current use of insulin  [facility])   [place] [facility] Of [place]. [facility] Vicci Sober NOVAK, MD   1 year ago Bell's palsy   [facility] [facility] Of . [facility] Vicci Sober NOVAK, MD       Future Appointments             In 1 month Vicci Sober NOVAK, MD [facility] Dept Of [place]. [facility] [place]

## 2024-04-11 ENCOUNTER — Other Ambulatory Visit: Payer: Self-pay | Admitting: Internal Medicine

## 2024-04-11 DIAGNOSIS — E1159 Type 2 diabetes mellitus with other circulatory complications: Secondary | ICD-10-CM

## 2024-04-14 ENCOUNTER — Ambulatory Visit: Admitting: Internal Medicine

## 2024-04-22 ENCOUNTER — Encounter: Payer: Self-pay | Admitting: Internal Medicine

## 2024-04-22 ENCOUNTER — Ambulatory Visit (INDEPENDENT_AMBULATORY_CARE_PROVIDER_SITE_OTHER): Admitting: Internal Medicine

## 2024-04-22 ENCOUNTER — Ambulatory Visit: Attending: Internal Medicine | Admitting: Internal Medicine

## 2024-04-22 VITALS — BP 119/76 | HR 67 | Temp 97.8°F | Ht 70.0 in | Wt 200.0 lb

## 2024-04-22 VITALS — BP 122/70 | HR 67 | Ht 70.0 in | Wt 200.6 lb

## 2024-04-22 DIAGNOSIS — Z7985 Long-term (current) use of injectable non-insulin antidiabetic drugs: Secondary | ICD-10-CM | POA: Diagnosis not present

## 2024-04-22 DIAGNOSIS — E1165 Type 2 diabetes mellitus with hyperglycemia: Secondary | ICD-10-CM

## 2024-04-22 DIAGNOSIS — Z794 Long term (current) use of insulin: Secondary | ICD-10-CM

## 2024-04-22 DIAGNOSIS — E1159 Type 2 diabetes mellitus with other circulatory complications: Secondary | ICD-10-CM

## 2024-04-22 DIAGNOSIS — Z23 Encounter for immunization: Secondary | ICD-10-CM

## 2024-04-22 DIAGNOSIS — E785 Hyperlipidemia, unspecified: Secondary | ICD-10-CM

## 2024-04-22 DIAGNOSIS — Z7984 Long term (current) use of oral hypoglycemic drugs: Secondary | ICD-10-CM | POA: Diagnosis not present

## 2024-04-22 DIAGNOSIS — I2510 Atherosclerotic heart disease of native coronary artery without angina pectoris: Secondary | ICD-10-CM

## 2024-04-22 DIAGNOSIS — E119 Type 2 diabetes mellitus without complications: Secondary | ICD-10-CM

## 2024-04-22 DIAGNOSIS — E1169 Type 2 diabetes mellitus with other specified complication: Secondary | ICD-10-CM

## 2024-04-22 LAB — POCT GLYCOSYLATED HEMOGLOBIN (HGB A1C): Hemoglobin A1C: 7.8 % — AB (ref 4.0–5.6)

## 2024-04-22 MED ORDER — METOPROLOL SUCCINATE ER 25 MG PO TB24: 12.5000 mg | ORAL_TABLET | Freq: Every day | ORAL | 1 refills | Status: AC

## 2024-04-22 MED ORDER — BD PEN NEEDLE MINI U/F 31G X 5 MM MISC: 1.0000 | Freq: Every day | 3 refills | Status: DC

## 2024-04-22 MED ORDER — TIRZEPATIDE 5 MG/0.5ML ~~LOC~~ SOAJ: 5.0000 mg | SUBCUTANEOUS | 3 refills | Status: DC

## 2024-04-22 MED ORDER — EMPAGLIFLOZIN 25 MG PO TABS: 25.0000 mg | ORAL_TABLET | Freq: Every day | ORAL | 3 refills | Status: AC

## 2024-04-22 MED ORDER — LANTUS SOLOSTAR 100 UNIT/ML ~~LOC~~ SOPN: 35.0000 [IU] | PEN_INJECTOR | Freq: Every day | SUBCUTANEOUS | 6 refills | Status: DC

## 2024-04-22 MED ORDER — METFORMIN HCL 1000 MG PO TABS
1000.0000 mg | ORAL_TABLET | Freq: Two times a day (BID) | ORAL | 3 refills | Status: AC
Start: 2024-04-22 — End: ?

## 2024-04-22 MED ORDER — DEXCOM G7 SENSOR MISC: 1.0000 | 3 refills | Status: DC

## 2024-04-22 MED ORDER — GLIPIZIDE 10 MG PO TABS: 20.0000 mg | ORAL_TABLET | Freq: Two times a day (BID) | ORAL | 3 refills | Status: AC

## 2024-04-22 MED ORDER — LANTUS SOLOSTAR 100 UNIT/ML ~~LOC~~ SOPN: 32.0000 [IU] | PEN_INJECTOR | Freq: Every day | SUBCUTANEOUS | 6 refills | Status: DC

## 2024-04-22 NOTE — Progress Notes (Signed)
 Name: Paul [name]  MRN/ DOB: [id number], Nov 16, 1970   Age/ Sex: 53 y.o., male    PCP: Louder Barnie NOVAK, MD   Reason for Endocrinology Evaluation: Type 2 Diabetes Mellitus     Date of Initial Endocrinology Visit: 12/02/2021    PATIENT IDENTIFIER: Paul [name] is a 53 y.o. male with a past medical history of DM, CAD, HTN and dyslipidemia, OSA on CPAP . The patient presented for initial endocrinology clinic visit on 12/02/2021  for consultative assistance with his diabetes management.    HPI: Paul [name] was    Diagnosed with DM 2019 Hemoglobin A1c has ranged from 8.3% in 2022, peaking at 9.9% in 2019.  On his initial visit to our clinic his A1c was 7.9%, he was on metformin , Trulicity , Jardiance , and Lantus . We increased Trulicity  and Jardiance , continued metformin  and basal insulin    Started glipizide  07/2022  Switch Trulicity  to Ozempic  10/2022 due to shortage of supplies Switch back to Trulicity  05/2023 as the cost of Ozempic  was high compared to Trulicity   SUBJECTIVE:   During the last visit (12/18/2023): A1c 7.7%   Today (04/22/24): Paul [name] is here for a follow up on diabetes management. He checks his blood sugars multiple times daily through CGM.  No hypoglycemia  He is accompanied by his spouse today  [facility]  No nausea or vomiting  No constipation or diarrhea    HOME DIABETES REGIMEN: Metformin  1000 mg BID Glipizide  10 mg, 2 tab BID  Jardiance  25 mg daily  Trulicity  1.5 mg weekly Lantus  32 units daily    Statin: yes ACE-I/ARB: no    CONTINUOUS GLUCOSE MONITORING RECORD INTERPRETATION    Dates of Recording: 10/11-10/24/2025  Sensor description: Dexcom  Results statistics:   CGM use % of time 95  Average and SD 226/82  Time in range  32  %  % Time Above 180 30  % Time above 250 38  % Time Below target 0   Glycemic patterns summary: BGs are optimal overnight and increased throughout the  day Hyperglycemic episodes postprandial  Hypoglycemic episodes occurred N/A  Overnight periods: Mostly optimal    DIABETIC COMPLICATIONS: Microvascular complications:  Retinopathy (mild)  Denies: CKD, neuropathy  Last eye exam: Completed 03/2024  Macrovascular complications:  CAD (S/P PCI) Denies: PVD, CVA   PAST HISTORY: Past Medical History:  Past Medical History:  Diagnosis Date   Diabetes mellitus without complication (HCC)    Fatty liver    abnormal CT 2014   Heart disease    Hyperlipidemia    Kidney stones    Non-smoker    Sleep apnea    wears CPAP   Past Surgical History:  Past Surgical History:  Procedure Laterality Date   COLONOSCOPY     CORONARY STENT INTERVENTION N/A 08/26/2019   Procedure: CORONARY STENT INTERVENTION;  Surgeon: Darron Deatrice LABOR, MD;  [facility];  Service: Cardiovascular;  Laterality: N/A;   KNEE ARTHROSCOPY Right    LEFT HEART CATH AND CORONARY ANGIOGRAPHY N/A 08/26/2019   Procedure: LEFT HEART CATH AND CORONARY ANGIOGRAPHY;  Surgeon: Darron Deatrice LABOR, MD;  [facility];  Service: Cardiovascular;  Laterality: N/A;   UMBILICAL HERNIA REPAIR      Social History:  reports that he has never smoked. He has never used smokeless tobacco. He reports current alcohol use. He reports that he does not use drugs. Family History:  Family History  Problem Relation Age of Onset  Hypertension Father    Diabetes Mother    Sleep apnea Other    Heart attack Maternal Grandfather    Heart attack Paternal Grandfather    Cancer Neg Hx    Colon cancer Neg Hx    Esophageal cancer Neg Hx    Stomach cancer Neg Hx    Rectal cancer Neg Hx      HOME MEDICATIONS: Allergies as of 04/22/2024       Reactions   Codeine Nausea And Vomiting, Other (See Comments)   headache   Crestor  [rosuvastatin ]    Elveated LFTs with Crestor  40mg  daily   Hydrocodone Nausea And Vomiting   Oxycodone  Hcl Other (See Comments)   Headaches         Medication List        Accurate as of April 22, 2024  8:20 AM. If you have any questions, ask your nurse or doctor.          aspirin  EC 81 MG tablet Take 1 tablet (81 mg total) by mouth daily.   B-D UF III MINI PEN NEEDLES 31G X 5 MM Misc Generic drug: Insulin  Pen Needle USE 1 PEN NEEDLE UNDER THE SKIN DAILY IN THE AFTERNOON AS DIRECTED   clopidogrel  75 MG tablet Commonly known as: PLAVIX  Take 1 tablet (75 mg total) by mouth daily.   clopidogrel  75 MG tablet Commonly known as: PLAVIX  TAKE 1 TABLET DAILY   Dexcom G7 Sensor Misc 1 Device by Does not apply route as directed.   empagliflozin  25 MG Tabs tablet Commonly known as: JARDIANCE  Take 1 tablet (25 mg total) by mouth daily before breakfast.   furosemide  20 MG tablet Commonly known as: LASIX  Take 1 tablet (20 mg total) by mouth daily as needed.   glipiZIDE  10 MG tablet Commonly known as: GLUCOTROL  Take 2 tablets (20 mg total) by mouth 2 (two) times daily before a meal.   Lantus  SoloStar 100 UNIT/ML Solostar Pen Generic drug: insulin  glargine INJECT 32 UNITS UNDER THE SKIN DAILY What changed: See the new instructions.   metFORMIN  1000 MG tablet Commonly known as: GLUCOPHAGE  TAKE 1 TABLET TWICE A DAY WITH MEALS   metoprolol  succinate 25 MG 24 hr tablet Commonly known as: TOPROL -XL TAKE ONE-HALF (1/2) TABLET DAILY   nitroGLYCERIN  0.4 MG SL tablet Commonly known as: Nitrostat  Place 1 tablet (0.4 mg total) under the tongue every 5 (five) minutes as needed.   omeprazole  20 MG capsule Commonly known as: PRILOSEC Take 1 capsule (20 mg total) by mouth daily as needed.   rosuvastatin  10 MG tablet Commonly known as: CRESTOR  TAKE 1 TABLET(10 MG) BY MOUTH DAILY.   sildenafil 50 MG tablet Commonly known as: VIAGRA Take 50 mg by mouth daily as needed for erectile dysfunction.   THERAWORX RELIEF EX Apply 1 application topically daily as needed (muscle cramps / pains).   triamcinolone  cream 0.1  % Commonly known as: KENALOG  Apply 1 Application topically 2 (two) times daily.   Trulicity  1.5 MG/0.5ML Soaj Generic drug: Dulaglutide  INJECT 1.5 MG UNDER THE SKIN ONCE A WEEK   Turmeric 500 MG Caps Take 1,000 mg by mouth once a week.         ALLERGIES: Allergies  Allergen Reactions   Codeine Nausea And Vomiting and Other (See Comments)    headache   Crestor  [Rosuvastatin ]     Elveated LFTs with Crestor  40mg  daily    Hydrocodone Nausea And Vomiting   Oxycodone  Hcl Other (See Comments)    Headaches  REVIEW OF SYSTEMS: A comprehensive ROS was conducted with the patient and is negative except as per HPI    OBJECTIVE:   VITAL SIGNS: BP 122/70   Pulse 67   Ht 5' 10 (1.778 m)   Wt 200 lb 9.6 oz (91 kg)   SpO2 98%   BMI 28.78 kg/m    PHYSICAL EXAM:  General: Pt appears well and is in NAD  Neck: General: Supple without adenopathy or carotid bruits. Thyroid: Thyroid size normal.  No goiter or nodules appreciated.   Lungs: Clear with good BS bilat   Heart: RRR   Abdomen:  soft, nontender  Extremities:  Lower extremities -No pretibial edema,   Neuro: MS is good with appropriate affect, pt is alert and Ox3    DM foot exam:04/22/2024  The skin of the feet is intact without sores or ulcerations, right great toe nail discoloration  The pedal pulses are 2+ on right and 2+ on left. The sensation is intact to a screening 5.07, 10 gram monofilament bilaterally   DATA REVIEWED:  Lab Results  Component Value Date   HGBA1C 7.7 (A) 12/14/2023   HGBA1C 8.0 (A) 09/11/2023   HGBA1C 8.5 (A) 08/14/2023     Latest Reference Range & Units 12/18/23 11:00  TSH 0.40 - 4.50 mIU/L 2.50  T4,Free(Direct) 0.8 - 1.8 ng/dL 1.1     Latest Reference Range & Units 12/14/23 10:08  Sodium 134 - 144 mmol/L 143  Potassium 3.5 - 5.2 mmol/L 4.4  Chloride 96 - 106 mmol/L 103  CO2 20 - 29 mmol/L 20  Glucose 70 - 99 mg/dL 875 (H)  BUN 6 - 24 mg/dL 18  Creatinine 9.23 - 8.72 mg/dL  9.06  Calcium  8.7 - 10.2 mg/dL 9.7  BUN/Creatinine Ratio 9 - 20  19  eGFR >59 mL/min/1.73 98  Alkaline Phosphatase 44 - 121 IU/L 81  Albumin 3.8 - 4.9 g/dL 4.7  AST 0 - 40 IU/L 17  ALT 0 - 44 IU/L 25  Total Protein 6.0 - 8.5 g/dL 6.7  Total Bilirubin 0.0 - 1.2 mg/dL 0.8  Total CHOL/HDL Ratio 0.0 - 5.0 ratio 3.3  Cholesterol, Total 100 - 199 mg/dL 878  HDL Cholesterol >60 mg/dL 37 (L)  MICROALB/CREAT RATIO 0 - 29 mg/g creat <4  Triglycerides 0 - 149 mg/dL 854  VLDL Cholesterol Cal 5 - 40 mg/dL 25  LDL Chol Calc (NIH) 0 - 99 mg/dL 59    Old records , labs and images have been reviewed.    ASSESSMENT / PLAN / RECOMMENDATIONS:   1) Type 2 Diabetes Mellitus,Sub-Optimally controlled, With macrovascular and retinopathic  complications - Most recent A1c of 7.8 %. Goal A1c < 7.0 %.    -A1c continues to be above goal -Patient continues with postprandial hyperglycemia despite maximizing on glipizide  -He is on Trulicity  1.5 mg, unable to tolerate higher doses -Ozempic  has been close prohibitive -Not a candidate for pioglitazone due to lower extremity edema -I have recommended switching from Trulicity  to Mounjaro, patient may contact office in 2 months to increase the dose if no side effects   MEDICATIONS: Continue glipizide  10 mg, 2 tabs twice daily Continue Lantus   32 units daily Continue Jardiance  25 mg daily Continue metformin  1000 mg twice daily Switch Trulicity  to Mounjaro 5 mg weekly  EDUCATION / INSTRUCTIONS: BG monitoring instructions: Patient is instructed to check his blood sugars 3 times a day, before meals. Call [facility] if: BG persistently < 70  I reviewed the Rule  of 15 for the treatment of hypoglycemia in detail with the patient. Literature supplied.   2) Diabetic complications:  Eye: Does  have known diabetic retinopathy.  Neuro/ Feet: Does not have known diabetic peripheral neuropathy. Renal: Patient does not have known baseline CKD. He is on  an ACEI/ARB at present.   3) CAD/Dyslipidemia:   -Patient follows with cardiology    F/U in 3 months   Signed electronically by: Stefano Redgie Butts, MD  [facility]  [facility] [address] [place] [place], [place] [address] Phone: [phone] FAX: [phone]   CC: Vicci Barnie NOVAK, MD [address] [place] [address] [place] [address] Phone: [phone]  Fax: [phone]    Return to Endocrinology clinic as below: Future Appointments  Date Time Provider Department Center  04/22/2024 10:10 AM Vicci Barnie NOVAK, MD [id number]

## 2024-04-22 NOTE — Progress Notes (Signed)
 [name], male    DOB: 1971/02/28  MRN: [id number]  CC: Diabetes (DM f/u. Paul [name] blood work for detection of heart conditions/Flu vax administered on 04/22/2024 - C.A.)   Subjective: Paul [name] is a 53 y.o. male who presents for chronic ds management. Wife is with him. His concerns today include:  Hx of DM, HL, OSA on CPAP, ED (Viagra through [facility]), CAD (3 vessle CAD with 80% mid-LAD s/p DES).    Discussed the use of AI scribe software for clinical note transcription with the patient, who gave verbal consent to proceed.  History of Present Illness Paul [name] is a 53 year old male with diabetes and coronary artery disease who presents for follow-up on his diabetes management and cardiovascular health.  CAD/HL: He is currently on aspirin , Plavix , Toprol , and Crestor  for cardiovascular health. His LDL cholesterol was last checked in June and was 59. He has a family history of heart disease, including his father who had atrial fibrillation and possibly died from a heart attack or stroke. He would like to his his LipoA level checked for future reference should he have kids. No chest pain or shortness of breath recently. An echocardiogram and exercise stress test were conducted in June by his cardiologist Dr. Shlomo and were both normal.  He has a history of leg swelling, previously more pronounced in the right leg, but reports improvement and has not needed to use furosemide . He is mindful of his salt intake, primarily consuming salads without added salt.  DM:  Lab Results  Component Value Date   HGBA1C 7.8 (A) 04/22/2024  For diabetes management, he was seen by his endocrinologist Dr. Kellie this morning who changed him from Trulicity  to Mounjaro 5 mg once a wk due to an A1c of 7.8, which is an increase from 7.7. He is starting on 5 mg of Mounjaro. He continues on glipizide  10 mg, two tablets in the morning and evening, and metformin  1000 mg  twice daily. He has increased his Glargine insulin  dose from 32 to 35 units due to high blood sugar levels, particularly postprandial. He uses a continuous glucose monitor, which shows 32% time in target range and 67% high readings over past 2 wks. He acknowledges dietary indiscretions, particularly with high-carb and sweet foods, and recognizes the need for more physical activity, specifically walking. When he goes to gym he focuses mainly on wgh training  HM: He has not had his flu shot yet. He recently had an eye exam at the [facility], which indicated no glaucoma but possible diabetic retinopathy. He will send me a copy of the letter he received from them via Mychart.     Patient Active Problem List   Diagnosis Date Noted   Non-smoker 12/16/2022   Pre-syncope 12/16/2022   Dropped heart beats 02/27/2022   Diabetes mellitus (HCC) 12/03/2021   Dyslipidemia 12/03/2021   Type 2 diabetes mellitus with hyperglycemia, with long-term current use of insulin  (HCC) 12/03/2021   Type 2 diabetes mellitus with retinopathy, with long-term current use of insulin  (HCC) 12/03/2021   Acute rhinitis 07/24/2020   Fatty liver 11/24/2019   CAD (coronary artery disease) 09/08/2019   Erectile dysfunction 08/12/2016   Polyp of colon 08/12/2016   Obstructive sleep apnea 04/03/2015   Onychomycosis of right great toe 04/03/2015   Diabetes type 2, uncontrolled 01/25/2014   Dyslipidemia associated with type 2 diabetes mellitus (HCC) 01/25/2014   Skin tag 01/25/2014     Current Outpatient  Medications on File Prior to Visit  Medication Sig Dispense Refill   aspirin  EC 81 MG tablet Take 1 tablet (81 mg total) by mouth daily. 90 tablet 3   clopidogrel  (PLAVIX ) 75 MG tablet Take 1 tablet (75 mg total) by mouth daily. 7 tablet 0   clopidogrel  (PLAVIX ) 75 MG tablet TAKE 1 TABLET DAILY 90 tablet 3   Continuous Glucose Sensor (DEXCOM G7 SENSOR) MISC 1 Device by Does not apply route as directed. 9 each 3   empagliflozin   (JARDIANCE ) 25 MG TABS tablet Take 1 tablet (25 mg total) by mouth daily before breakfast. 90 tablet 3   glipiZIDE  (GLUCOTROL ) 10 MG tablet Take 2 tablets (20 mg total) by mouth 2 (two) times daily before a meal. 360 tablet 3   Homeopathic Products (THERAWORX RELIEF EX) Apply 1 application topically daily as needed (muscle cramps / pains).     Insulin  Pen Needle (B-D UF III MINI PEN NEEDLES) 31G X 5 MM MISC 1 Device by Other route daily in the afternoon. 100 each 3   metFORMIN  (GLUCOPHAGE ) 1000 MG tablet Take 1 tablet (1,000 mg total) by mouth 2 (two) times daily with a meal. 180 tablet 3   nitroGLYCERIN  (NITROSTAT ) 0.4 MG SL tablet Place 1 tablet (0.4 mg total) under the tongue every 5 (five) minutes as needed. 25 tablet 2   omeprazole  (PRILOSEC) 20 MG capsule Take 1 capsule (20 mg total) by mouth daily as needed. 30 capsule 3   rosuvastatin  (CRESTOR ) 10 MG tablet TAKE 1 TABLET(10 MG) BY MOUTH DAILY. 90 tablet 2   sildenafil (VIAGRA) 50 MG tablet Take 50 mg by mouth daily as needed for erectile dysfunction.     tirzepatide (MOUNJARO) 5 MG/0.5ML Pen Inject 5 mg into the skin once a week. To replace Trulicity  6 mL 3   triamcinolone  cream (KENALOG ) 0.1 % Apply 1 Application topically 2 (two) times daily. 30 g 0   furosemide  (LASIX ) 20 MG tablet Take 1 tablet (20 mg total) by mouth daily as needed. 90 tablet 3   Turmeric 500 MG CAPS Take 1,000 mg by mouth once a week. (Patient not taking: Reported on 04/22/2024)     No current facility-administered medications on file prior to visit.    Allergies  Allergen Reactions   Codeine Nausea And Vomiting and Other (See Comments)    headache   Crestor  [Rosuvastatin ]     Elveated LFTs with Crestor  40mg  daily    Hydrocodone Nausea And Vomiting   Oxycodone  Hcl Other (See Comments)    Headaches    Social History   Socioeconomic History   Marital status: Married    Spouse name: Not on file   Number of children: 2   Years of education: Not on file    Highest education level: Associate degree: occupational, Scientist, product/process development, or vocational program  Occupational History   Occupation: Engineer, materials  Tobacco Use   Smoking status: Never   Smokeless tobacco: Never  Vaping Use   Vaping status: Never Used  Substance and Sexual Activity   Alcohol use: Yes    Comment: Social   Drug use: No   Sexual activity: Yes  Other Topics Concern   Not on file  Social History Narrative   Not on file   Social Drivers of Health   Financial Resource Strain: Low Risk  (04/19/2024)   Overall Financial Resource Strain (CARDIA)    Difficulty of Paying Living Expenses: Not hard at all  Food Insecurity: No Food Insecurity (04/19/2024)  Hunger Vital Sign    Worried About Running Out of Food in the Last Year: Never true    Ran Out of Food in the Last Year: Never true  Transportation Needs: No Transportation Needs (04/19/2024)   PRAPARE - Administrator, Civil Service (Medical): No    Lack of Transportation (Non-Medical): No  Physical Activity: Sufficiently Active (04/19/2024)   Exercise Vital Sign    Days of Exercise per Week: 4 days    Minutes of Exercise per Session: 120 min  Stress: No Stress Concern Present (04/19/2024)   [employer] of Occupational Health - Occupational Stress Questionnaire    Feeling of Stress: Not at all  Social Connections: Moderately Integrated (04/19/2024)   Social Connection and Isolation Panel    Frequency of Communication with Friends and Family: Three times a week    Frequency of Social Gatherings with Friends and Family: Three times a week    Attends Religious Services: 1 to 4 times per year    Active Member of Clubs or Organizations: No    Attends Banker Meetings: Not on file    Marital Status: Married  Catering manager Violence: Not At Risk (12/14/2023)   Humiliation, Afraid, Rape, and Kick questionnaire    Fear of Current or Ex-Partner: No    Emotionally Abused: No    Physically Abused:  No    Sexually Abused: No    Family History  Problem Relation Age of Onset   Hypertension Father    Diabetes Mother    Sleep apnea Other    Heart attack Maternal Grandfather    Heart attack Paternal Grandfather    Cancer Neg Hx    Colon cancer Neg Hx    Esophageal cancer Neg Hx    Stomach cancer Neg Hx    Rectal cancer Neg Hx     Past Surgical History:  Procedure Laterality Date   COLONOSCOPY     CORONARY STENT INTERVENTION N/A 08/26/2019   Procedure: CORONARY STENT INTERVENTION;  Surgeon: Darron Deatrice LABOR, MD;  [facility];  Service: Cardiovascular;  Laterality: N/A;   KNEE ARTHROSCOPY Right    LEFT HEART CATH AND CORONARY ANGIOGRAPHY N/A 08/26/2019   Procedure: LEFT HEART CATH AND CORONARY ANGIOGRAPHY;  Surgeon: Darron Deatrice LABOR, MD;  [facility];  Service: Cardiovascular;  Laterality: N/A;   UMBILICAL HERNIA REPAIR      ROS: Review of Systems Negative except as stated above  PHYSICAL EXAM: BP 119/76 (BP Location: Left Arm, Patient Position: Sitting, Cuff Size: Normal)   Pulse 67   Temp 97.8 F (36.6 C) (Oral)   Ht 5' 10 (1.778 m)   Wt 200 lb (90.7 kg)   SpO2 96%   BMI 28.70 kg/m   Physical Exam   General appearance - alert, well appearing, and in no distress Mental status - normal mood, behavior, speech, dress, motor activity, and thought processes Chest - clear to auscultation, no wheezes, rales or rhonchi, symmetric air entry Heart - normal rate, regular rhythm, normal S1, S2, no murmurs, rubs, clicks or gallops Extremities - trace edema RT lower leg above sock line     Latest Ref Rng & Units 12/14/2023   10:08 AM 04/10/2023   10:22 AM 10/23/2022    7:20 AM  CMP  Glucose 70 - 99 mg/dL 875  81    BUN 6 - 24 mg/dL 18  19    Creatinine 9.23 - 1.27 mg/dL 9.06  9.19  Sodium 134 - 144 mmol/L 143  144    Potassium 3.5 - 5.2 mmol/L 4.4  4.0    Chloride 96 - 106 mmol/L 103  105    CO2 20 - 29 mmol/L 20  21    Calcium  8.7  - 10.2 mg/dL 9.7  9.5    Total Protein 6.0 - 8.5 g/dL 6.7   6.2   Total Bilirubin 0.0 - 1.2 mg/dL 0.8   0.6   Alkaline Phos 44 - 121 IU/L 81   86   AST 0 - 40 IU/L 17   18   ALT 0 - 44 IU/L 25   26    Lipid Panel     Component Value Date/Time   CHOL 121 12/14/2023 1008   TRIG 145 12/14/2023 1008   HDL 37 (L) 12/14/2023 1008   CHOLHDL 3.3 12/14/2023 1008   CHOLHDL 3 12/02/2021 1213   VLDL 21.8 12/02/2021 1213   LDLCALC 59 12/14/2023 1008    CBC    Component Value Date/Time   WBC 6.7 04/10/2023 1022   WBC 7.4 09/29/2022 0604   RBC 5.12 04/10/2023 1022   RBC 5.29 09/29/2022 0604   HGB 15.8 04/10/2023 1022   HCT 46.3 04/10/2023 1022   PLT 187 04/10/2023 1022   MCV 90 04/10/2023 1022   MCH 30.9 04/10/2023 1022   MCH 30.4 09/29/2022 0604   MCHC 34.1 04/10/2023 1022   MCHC 35.5 09/29/2022 0604   RDW 12.8 04/10/2023 1022   LYMPHSABS 1.4 09/29/2022 0604   LYMPHSABS 2.0 01/24/2021 1021   MONOABS 0.7 09/29/2022 0604   EOSABS 0.4 09/29/2022 0604   EOSABS 0.5 (H) 01/24/2021 1021   BASOSABS 0.1 09/29/2022 0604   BASOSABS 0.1 01/24/2021 1021    ASSESSMENT AND PLAN: 1. Type 2 diabetes mellitus with other circulatory complication, with long-term current use of insulin  (HCC) (Primary) Patient seen by his endocrinologist today.  Trulicity  was discontinued.  Now on Mounjaro 5 mg once a week.  He will be continuing glargine insulin  35 units daily, glipizide  10 mg 2 tablets twice a day, metformin  twice a day and Jardiance  25 mg daily. -Dietary counseling given.  Encouraged him to use his CGM as a tool to help with food choices. Avoid skipping meals and get in a protein with each meal. Encourage getting in some moderate intensity exercise with goal 150 mins/wk -Patient to send me a copy of report of last eye exam that was done at the [facility] 1 week ago. - insulin  glargine (LANTUS  SOLOSTAR) 100 UNIT/ML Solostar Pen; Inject 35 Units into the skin daily.  Dispense: 30 mL; Refill: 6  2. Long-term  (current) use of injectable non-insulin  antidiabetic drugs 3. Diabetes mellitus treated with oral medication (HCC) See #1 above  4. Coronary artery disease involving native coronary artery of native heart without angina pectoris Stable.  Continue aspirin , Plavix , metoprolol , Crestor  10 mg daily Patient would like to know his lipoprotein a level even though it likely will not change management.  He will stop at the lab today. - metoprolol  succinate (TOPROL -XL) 25 MG 24 hr tablet; Take 0.5 tablets (12.5 mg total) by mouth daily.  Dispense: 45 tablet; Refill: 1 - Lipoprotein A (LPA)  5. Dyslipidemia associated with type 2 diabetes mellitus (HCC) See #4 above  6. Need for influenza vaccination - Flu vaccine trivalent PF, 6mos and older(Flulaval,Afluria,Fluarix,Fluzone)  Patient was given the opportunity to ask questions.  Patient verbalized understanding of the plan and was able to repeat key elements  of the plan.   This documentation was completed using Paediatric nurse.  Any transcriptional errors are unintentional.  Orders Placed This Encounter  Procedures   Flu vaccine trivalent PF, 6mos and older(Flulaval,Afluria,Fluarix,Fluzone)   Lipoprotein A (LPA)     Requested Prescriptions   Signed Prescriptions Disp Refills   metoprolol  succinate (TOPROL -XL) 25 MG 24 hr tablet 45 tablet 1    Sig: Take 0.5 tablets (12.5 mg total) by mouth daily.   insulin  glargine (LANTUS  SOLOSTAR) 100 UNIT/ML Solostar Pen 30 mL 6    Sig: Inject 35 Units into the skin daily.    Return in about 4 months (around 08/23/2024).  Barnie Louder, MD, FACP

## 2024-04-22 NOTE — Patient Instructions (Signed)
 Switch Trulicity  to Mounjaro 5 mg weekly Continue glipizide  10 mg, 2 tablets before Breakfast and 2 tablet before Supper  Continue Lantus  32 units daily  Continue Jardiance  25 mg daily  Continue Metformin  1000 mg twice daily     HOW TO TREAT LOW BLOOD SUGARS (Blood sugar LESS THAN 70 MG/DL) Please follow the RULE OF 15 for the treatment of hypoglycemia treatment (when your (blood sugars are less than 70 mg/dL)   STEP 1: Take 15 grams of carbohydrates when your blood sugar is low, which includes:  3-4 GLUCOSE TABS  OR 3-4 OZ OF JUICE OR REGULAR SODA OR ONE TUBE OF GLUCOSE GEL    STEP 2: RECHECK blood sugar in 15 MINUTES STEP 3: If your blood sugar is still low at the 15 minute recheck --> then, go back to STEP 1 and treat AGAIN with another 15 grams of carbohydrates.

## 2024-04-22 NOTE — Patient Instructions (Signed)
  VISIT SUMMARY: Today, you had a follow-up appointment to discuss your diabetes management and cardiovascular health. We reviewed your current medications, recent lab results, and lifestyle habits. We also discussed your recent eye exam and the improvement in your leg swelling.  YOUR PLAN: -TYPE 2 DIABETES MELLITUS WITH HYPERGLYCEMIA: Type 2 diabetes is a condition where your body does not use insulin  properly, leading to high blood sugar levels. Your A1c is currently 7.8%, and 67% of your glucose readings are above the target range. We will continue with Mounjaro 5 mg, adjust your insulin  to 35 units, and encourage you to make dietary changes and increase physical activity. Please review your continuous glucose monitor data regularly.  -SUSPECTED DIABETIC RETINOPATHY: Diabetic retinopathy is an eye condition that can occur in people with diabetes, potentially leading to vision problems. Your recent eye exam at the [facility] suggested possible diabetic retinopathy. [facility].  -ATHEROSCLEROTIC HEART DISEASE OF NATIVE CORONARY ARTERY: Atherosclerotic heart disease is a condition where the arteries in your heart become narrowed or blocked due to plaque buildup. Your recent echocardiogram and stress test were normal. We will order a lipoprotein A level to assess your familial risk.  -EDEMA OF LOWER EXTREMITIES: Edema is swelling caused by excess fluid trapped in your body's tissues. Your leg swelling has improved with dietary salt restriction. Please continue to limit your salt intake.  INSTRUCTIONS: Please [facility] a [facility] of [facility] via [facility]. Continue with your current medications and lifestyle changes. We will order a lipoprotein A level to assess your familial risk for heart disease. Review your continuous glucose monitor data regularly and make the necessary adjustments to your diet and physical activity. Follow up with your endocrinologist as  needed.                      Contains text generated by Abridge.                                 Contains text generated by Abridge.

## 2024-04-25 LAB — LIPOPROTEIN A (LPA): Lipoprotein (a): 137.6 nmol/L — ABNORMAL HIGH (ref <75.0)

## 2024-04-26 ENCOUNTER — Ambulatory Visit: Payer: Self-pay | Admitting: Internal Medicine

## 2024-05-23 ENCOUNTER — Encounter (INDEPENDENT_AMBULATORY_CARE_PROVIDER_SITE_OTHER): Payer: Self-pay | Admitting: Primary Care

## 2024-05-23 ENCOUNTER — Ambulatory Visit (INDEPENDENT_AMBULATORY_CARE_PROVIDER_SITE_OTHER): Admitting: Primary Care

## 2024-05-23 DIAGNOSIS — M542 Cervicalgia: Secondary | ICD-10-CM | POA: Diagnosis not present

## 2024-05-23 NOTE — Patient Instructions (Signed)
 [facility] [address] [place], [place] [address] Phone [phone]
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
## 2024-07-01 ENCOUNTER — Encounter: Payer: Self-pay | Admitting: Internal Medicine

## 2024-07-01 ENCOUNTER — Ambulatory Visit (INDEPENDENT_AMBULATORY_CARE_PROVIDER_SITE_OTHER): Admitting: Internal Medicine

## 2024-07-01 VITALS — BP 124/78 | Ht 70.0 in | Wt 194.0 lb

## 2024-07-01 DIAGNOSIS — Z794 Long term (current) use of insulin: Secondary | ICD-10-CM | POA: Diagnosis not present

## 2024-07-01 DIAGNOSIS — E1159 Type 2 diabetes mellitus with other circulatory complications: Secondary | ICD-10-CM

## 2024-07-01 LAB — POCT GLYCOSYLATED HEMOGLOBIN (HGB A1C): Hemoglobin A1C: 8.3 % — AB (ref 4.0–5.6)

## 2024-07-01 MED ORDER — DEXCOM G7 SENSOR MISC: 1.0000 | Status: AC

## 2024-07-01 MED ORDER — TIRZEPATIDE 5 MG/0.5ML ~~LOC~~ SOAJ: 5.0000 mg | SUBCUTANEOUS | 3 refills | Status: AC

## 2024-07-01 MED ORDER — NOVOLOG FLEXPEN 100 UNIT/ML ~~LOC~~ SOPN: 6.0000 [IU] | PEN_INJECTOR | Freq: Three times a day (TID) | SUBCUTANEOUS | 4 refills | Status: AC

## 2024-07-01 MED ORDER — LANTUS SOLOSTAR 100 UNIT/ML ~~LOC~~ SOPN: 32.0000 [IU] | PEN_INJECTOR | Freq: Every day | SUBCUTANEOUS | 6 refills | Status: AC

## 2024-07-01 MED ORDER — BD PEN NEEDLE MINI U/F 31G X 5 MM MISC: 1.0000 | Freq: Four times a day (QID) | 3 refills | Status: AC

## 2024-07-01 NOTE — Progress Notes (Signed)
 " Name: Paul [name]  MRN/ DOB: [id number], 09-Jun-1971   Age/ Sex: 54 y.o., male    PCP: Louder Barnie NOVAK, MD   Reason for Endocrinology Evaluation: Type 2 Diabetes Mellitus     Date of Initial Endocrinology Visit: 12/02/2021    PATIENT IDENTIFIER: [name] is a 54 y.o. male with a past medical history of DM, CAD, HTN and dyslipidemia, OSA on CPAP . The patient presented for initial endocrinology clinic visit on 12/02/2021  for consultative assistance with his diabetes management.    HPI: Mr. Truss was    Diagnosed with DM 2019 Hemoglobin A1c has ranged from 8.3% in 2022, peaking at 9.9% in 2019.  On his initial visit to our clinic his A1c was 7.9%, he was on metformin , Trulicity , Jardiance , and Lantus . We increased Trulicity  and Jardiance , continued metformin  and basal insulin    Started glipizide  07/2022  Switch Trulicity  to Ozempic  10/2022 due to shortage of supplies Switch back to Trulicity  05/2023 as the cost of Ozempic  was high compared to Trulicity   I switched Trulicity  to Mounjaro  in October, 2025 with an A1c of 7.8%  SUBJECTIVE:   During the last visit (04/22/2024): A1c 7.8%   Today (07/01/2024): [name] is here for a follow up on diabetes management. He checks his blood sugars multiple times daily through CGM.  The patient has been noted with hypoglycemia, which is rare and occurs overnight   [facility]  He receives Dexcom through the clinic No nausea or vomiting  Historically has had no constipation or diarrhea except yesterday but he attributes this to the possibility of eating chips the prior night  HOME DIABETES REGIMEN: Metformin  1000 mg BID Glipizide  10 mg, 2 tab BID  Jardiance  25 mg daily  Mounjaro  5 mg weekly Lantus  32 units daily    Statin: yes ACE-I/ARB: no    CONTINUOUS GLUCOSE MONITORING RECORD INTERPRETATION    Dates of Recording: 12/20-07/01/2024  Sensor description: Dexcom  Results  statistics:   CGM use % of time 85  Average and SD 234/89  Time in range 30 %  % Time Above 180 28  % Time above 250 41  % Time Below target 1   Glycemic patterns summary: BGs trend down overnight and fluctuate during the day Hyperglycemic episodes postprandial  Hypoglycemic episodes occurred rare overnight  Overnight periods: High    DIABETIC COMPLICATIONS: Microvascular complications:  Retinopathy (mild)  Denies: CKD, neuropathy  Last eye exam: Completed 03/2024  Macrovascular complications:  CAD (S/P PCI) Denies: PVD, CVA   PAST HISTORY: Past Medical History:  Past Medical History:  Diagnosis Date   Diabetes mellitus without complication (HCC)    Fatty liver    abnormal CT 2014   Heart disease    Hyperlipidemia    Kidney stones    Non-smoker    Sleep apnea    wears CPAP   Past Surgical History:  Past Surgical History:  Procedure Laterality Date   COLONOSCOPY     CORONARY STENT INTERVENTION N/A 08/26/2019   Procedure: CORONARY STENT INTERVENTION;  Surgeon: Darron Deatrice LABOR, MD;  Location: [facility];  Service: Cardiovascular;  Laterality: N/A;   KNEE ARTHROSCOPY Right    LEFT HEART CATH AND CORONARY ANGIOGRAPHY N/A 08/26/2019   Procedure: LEFT HEART CATH AND CORONARY ANGIOGRAPHY;  Surgeon: Darron Deatrice LABOR, MD;  Location: [facility];  Service: Cardiovascular;  Laterality: N/A;   UMBILICAL HERNIA REPAIR      Social  History:  reports that he has never smoked. He has never used smokeless tobacco. He reports current alcohol use. He reports that he does not use drugs. Family History:  Family History  Problem Relation Age of Onset   Hypertension Father    Diabetes Mother    Sleep apnea Other    Heart attack Maternal Grandfather    Heart attack Paternal Grandfather    Cancer Neg Hx    Colon cancer Neg Hx    Esophageal cancer Neg Hx    Stomach cancer Neg Hx    Rectal cancer Neg Hx      HOME MEDICATIONS: Allergies as of 07/01/2024        Reactions   Codeine Nausea And Vomiting, Other (See Comments)   headache   Crestor  [rosuvastatin ]    Elveated LFTs with Crestor  40mg  daily   Hydrocodone Nausea And Vomiting   Oxycodone  Hcl Other (See Comments)   Headaches        Medication List        Accurate as of July 01, 2024 11:46 AM. If you have any questions, ask your nurse or doctor.          aspirin  EC 81 MG tablet Take 1 tablet (81 mg total) by mouth daily.   B-D UF III MINI PEN NEEDLES 31G X 5 MM Misc Generic drug: Insulin  Pen Needle 1 Device by Other route daily in the afternoon.   clopidogrel  75 MG tablet Commonly known as: PLAVIX  Take 1 tablet (75 mg total) by mouth daily.   clopidogrel  75 MG tablet Commonly known as: PLAVIX  TAKE 1 TABLET DAILY   Dexcom G7 Sensor Misc 1 Device by Does not apply route as directed.   empagliflozin  25 MG Tabs tablet Commonly known as: JARDIANCE  Take 1 tablet (25 mg total) by mouth daily before breakfast.   furosemide  20 MG tablet Commonly known as: LASIX  Take 1 tablet (20 mg total) by mouth daily as needed.   glipiZIDE  10 MG tablet Commonly known as: GLUCOTROL  Take 2 tablets (20 mg total) by mouth 2 (two) times daily before a meal.   Lantus  SoloStar 100 UNIT/ML Solostar Pen Generic drug: insulin  glargine Inject 35 Units into the skin daily.   metFORMIN  1000 MG tablet Commonly known as: GLUCOPHAGE  Take 1 tablet (1,000 mg total) by mouth 2 (two) times daily with a meal.   metoprolol  succinate 25 MG 24 hr tablet Commonly known as: TOPROL -XL Take 0.5 tablets (12.5 mg total) by mouth daily.   nitroGLYCERIN  0.4 MG SL tablet Commonly known as: Nitrostat  Place 1 tablet (0.4 mg total) under the tongue every 5 (five) minutes as needed.   omeprazole  20 MG capsule Commonly known as: PRILOSEC Take 1 capsule (20 mg total) by mouth daily as needed.   rosuvastatin  10 MG tablet Commonly known as: CRESTOR  TAKE 1 TABLET(10 MG) BY MOUTH DAILY.   sildenafil 50 MG  tablet Commonly known as: VIAGRA Take 50 mg by mouth daily as needed for erectile dysfunction.   THERAWORX RELIEF EX Apply 1 application topically daily as needed (muscle cramps / pains).   tirzepatide  5 MG/0.5ML Pen Commonly known as: MOUNJARO  Inject 5 mg into the skin once a week. To replace Trulicity    triamcinolone  cream 0.1 % Commonly known as: KENALOG  Apply 1 Application topically 2 (two) times daily.   Turmeric 500 MG Caps Take 1,000 mg by mouth once a week.         ALLERGIES: Allergies  Allergen Reactions   Codeine Nausea  And Vomiting and Other (See Comments)    headache   Crestor  [Rosuvastatin ]     Elveated LFTs with Crestor  40mg  daily    Hydrocodone Nausea And Vomiting   Oxycodone  Hcl Other (See Comments)    Headaches     REVIEW OF SYSTEMS: A comprehensive ROS was conducted with the patient and is negative except as per HPI    OBJECTIVE:   VITAL SIGNS: BP 124/78   Ht 5' 10 (1.778 m)   Wt 194 lb (88 kg)   BMI 27.84 kg/m    PHYSICAL EXAM:  General: Pt appears well and is in NAD  Neck: General: Supple without adenopathy or carotid bruits. Thyroid: Thyroid size normal.  No goiter or nodules appreciated.   Lungs: Clear with good BS bilat   Heart: RRR   Abdomen:  soft, nontender  Extremities:  Lower extremities -trce  pretibial edema,   Neuro: MS is good with appropriate affect, pt is alert and Ox3    DM foot exam:04/22/2024  The skin of the feet is intact without sores or ulcerations, right great toe nail discoloration  The pedal pulses are 2+ on right and 2+ on left. The sensation is intact to a screening 5.07, 10 gram monofilament bilaterally   DATA REVIEWED:  Lab Results  Component Value Date   HGBA1C 7.8 (A) 04/22/2024   HGBA1C 7.7 (A) 12/14/2023   HGBA1C 8.0 (A) 09/11/2023     Latest Reference Range & Units 12/18/23 11:00  TSH 0.40 - 4.50 mIU/L 2.50  T4,Free(Direct) 0.8 - 1.8 ng/dL 1.1     Latest Reference Range & Units  12/14/23 10:08  Sodium 134 - 144 mmol/L 143  Potassium 3.5 - 5.2 mmol/L 4.4  Chloride 96 - 106 mmol/L 103  CO2 20 - 29 mmol/L 20  Glucose 70 - 99 mg/dL 875 (H)  BUN 6 - 24 mg/dL 18  Creatinine 9.23 - 8.72 mg/dL 9.06  Calcium  8.7 - 10.2 mg/dL 9.7  BUN/Creatinine Ratio 9 - 20  19  eGFR >59 mL/min/1.73 98  Alkaline Phosphatase 44 - 121 IU/L 81  Albumin 3.8 - 4.9 g/dL 4.7  AST 0 - 40 IU/L 17  ALT 0 - 44 IU/L 25  Total Protein 6.0 - 8.5 g/dL 6.7  Total Bilirubin 0.0 - 1.2 mg/dL 0.8  Total CHOL/HDL Ratio 0.0 - 5.0 ratio 3.3  Cholesterol, Total 100 - 199 mg/dL 878  HDL Cholesterol >60 mg/dL 37 (L)  MICROALB/CREAT RATIO 0 - 29 mg/g creat <4  Triglycerides 0 - 149 mg/dL 854  VLDL Cholesterol Cal 5 - 40 mg/dL 25  LDL Chol Calc (NIH) 0 - 99 mg/dL 59    Old records , labs and images have been reviewed.    ASSESSMENT / PLAN / RECOMMENDATIONS:   1) Type 2 Diabetes Mellitus,Poorly  controlled, With macrovascular and retinopathic  complications - Most recent A1c of 8.3%. Goal A1c < 7.0 %.    - patient has been noted worsening glycemic control -I have prescribed Mounjaro  on the last visit, but he continues to use that Trulicity  1.5 mg (unable to tolerate higher doses ) because he had just received 41-month supply.  We will attempt to switch Trulicity  to Mounjaro  -I have recommended discontinuing glipizide  and starting him on prandial dose of insulin  -Patient is aware of the importance of low-carb diet, and has been trying to incorporate more salads into his diet -Ozempic  has been close prohibitive -Not a candidate for pioglitazone due to lower extremity edema - Patient  has been noted with hypoglycemia overnight, I did advise the patient to decrease Lantus  to prevent hypoglycemia  MEDICATIONS: Stop glipizide  10 mg, 2 tabs twice daily Continue Jardiance  25 mg daily Continue metformin  1000 mg twice daily Switch Trulicity  to Mounjaro  5 mg weekly Decrease Lantus  32 units daily Start NovoLog  6  units with each meal Start CF: Novolog  (BG-130/30) TIDQAC  EDUCATION / INSTRUCTIONS: BG monitoring instructions: Patient is instructed to check his blood sugars 3 times a day, before meals. Call [facility] if: BG persistently < 70  I reviewed the Rule of 15 for the treatment of hypoglycemia in detail with the patient. Literature supplied.   2) Diabetic complications:  Eye: Does  have known diabetic retinopathy.  Neuro/ Feet: Does not have known diabetic peripheral neuropathy. Renal: Patient does not have known baseline CKD. He is on an ACEI/ARB at present.   3) CAD/Dyslipidemia:   -Patient follows with cardiology    F/U in 3 months   Signed electronically by: Stefano Redgie Butts, MD  [facility]  [facility] [address] [place], [place] [address] Phone: [phone] FAX: [phone]   CC: Vicci Barnie NOVAK, MD [address] [place] [address] Phone: [phone]  Fax: [phone]    Return to Endocrinology clinic as below: Future Appointments  Date Time Provider Department Center  08/26/2024 10:30 AM Vicci Barnie NOVAK, MD [facility] [address]    "

## 2024-07-01 NOTE — Patient Instructions (Addendum)
 Take Mounjaro  5 mg weekly(to replace Trulicity ) Stop glipizide  10 mg, 2 tablets before Breakfast and 2 tablet before Supper  Decrease Lantus  32 units daily  Continue Jardiance  25 mg daily  Continue Metformin  1000 mg twice daily  Start NovoLog  6 units with each meal Novolog  correctional insulin : ADD extra units on insulin  to your meal-time Novolog  dose if your blood sugars are higher than 160. Use the scale below to help guide you:   Blood sugar before meal Number of units to inject  Less than 160 0 unit  161 -  190 1 units  191 -  220 2 units  221 -  250 3 units  251 -  280 4 units  281 -  310 5 units  311 -  340 6 units  341 -  370 7 units  371 -  400 8 units  401 - 430 9 units        HOW TO TREAT LOW BLOOD SUGARS (Blood sugar LESS THAN 70 MG/DL) Please follow the RULE OF 15 for the treatment of hypoglycemia treatment (when your (blood sugars are less than 70 mg/dL)   STEP 1: Take 15 grams of carbohydrates when your blood sugar is low, which includes:  3-4 GLUCOSE TABS  OR 3-4 OZ OF JUICE OR REGULAR SODA OR ONE TUBE OF GLUCOSE GEL    STEP 2: RECHECK blood sugar in 15 MINUTES STEP 3: If your blood sugar is still low at the 15 minute recheck --> then, go back to STEP 1 and treat AGAIN with another 15 grams of carbohydrates.

## 2024-07-06 ENCOUNTER — Other Ambulatory Visit (HOSPITAL_COMMUNITY): Payer: Self-pay

## 2024-07-06 ENCOUNTER — Telehealth: Payer: Self-pay

## 2024-07-06 NOTE — Telephone Encounter (Signed)
 Pharmacy Patient Advocate Encounter   Received notification from [facility] that prior authorization for Mounjaro  5mg  is required/requested.   Insurance verification completed.   The patient is insured through [employer].   Per test claim: PA required; PA started via CoverMyMeds. KEY [id number] . Waiting for clinical questions to populate.

## 2024-07-15 ENCOUNTER — Ambulatory Visit
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
## 2024-07-22 ENCOUNTER — Ambulatory Visit: Admitting: Physical Therapy

## 2024-07-29 ENCOUNTER — Ambulatory Visit: Attending: Primary Care | Admitting: Physical Therapy

## 2024-07-29 ENCOUNTER — Other Ambulatory Visit: Payer: Self-pay

## 2024-07-29 ENCOUNTER — Encounter: Payer: Self-pay | Admitting: Physical Therapy

## 2024-07-29 DIAGNOSIS — Y939 Activity, unspecified: Secondary | ICD-10-CM | POA: Insufficient documentation

## 2024-07-29 DIAGNOSIS — Y929 Unspecified place or not applicable: Secondary | ICD-10-CM | POA: Insufficient documentation

## 2024-07-29 DIAGNOSIS — M542 Cervicalgia: Secondary | ICD-10-CM | POA: Insufficient documentation

## 2024-07-29 DIAGNOSIS — M25562 Pain in left knee: Secondary | ICD-10-CM | POA: Insufficient documentation

## 2024-08-26 ENCOUNTER — Ambulatory Visit

## 2024-08-26 ENCOUNTER — Ambulatory Visit: Admitting: Internal Medicine

## 2024-09-09 ENCOUNTER — Ambulatory Visit

## 2024-10-07 ENCOUNTER — Ambulatory Visit: Admitting: Internal Medicine
